# Patient Record
Sex: Female | Born: 1975 | Race: Black or African American | Hispanic: No | Marital: Single | Smoking: Never smoker
Health system: Southern US, Community
[De-identification: ages and names within clinical notes are randomized; demographics above are authoritative.]

## PROBLEM LIST (undated history)

## (undated) DIAGNOSIS — R3129 Other microscopic hematuria: Secondary | ICD-10-CM

## (undated) DIAGNOSIS — F32A Depression, unspecified: Secondary | ICD-10-CM

## (undated) DIAGNOSIS — K219 Gastro-esophageal reflux disease without esophagitis: Secondary | ICD-10-CM

## (undated) DIAGNOSIS — K429 Umbilical hernia without obstruction or gangrene: Secondary | ICD-10-CM

## (undated) DIAGNOSIS — D509 Iron deficiency anemia, unspecified: Secondary | ICD-10-CM

## (undated) DIAGNOSIS — I2699 Other pulmonary embolism without acute cor pulmonale: Secondary | ICD-10-CM

## (undated) DIAGNOSIS — I10 Essential (primary) hypertension: Secondary | ICD-10-CM

## (undated) DIAGNOSIS — N029 Recurrent and persistent hematuria with unspecified morphologic changes: Secondary | ICD-10-CM

## (undated) HISTORY — DX: Depression, unspecified: F32.A

## (undated) HISTORY — DX: Recurrent and persistent hematuria with unspecified morphologic changes: N02.9

## (undated) HISTORY — DX: Iron deficiency anemia, unspecified: D50.9

## (undated) HISTORY — DX: Umbilical hernia without obstruction or gangrene: K42.9

## (undated) HISTORY — DX: Other microscopic hematuria: R31.29

---

## 1998-09-07 ENCOUNTER — Other Ambulatory Visit: Admission: RE | Admit: 1998-09-07 | Discharge: 1998-09-07 | Payer: Self-pay | Admitting: Obstetrics

## 1998-09-09 ENCOUNTER — Other Ambulatory Visit: Admission: RE | Admit: 1998-09-09 | Discharge: 1998-09-09 | Payer: Self-pay | Admitting: Obstetrics

## 1999-08-05 ENCOUNTER — Inpatient Hospital Stay (HOSPITAL_COMMUNITY): Admission: AD | Admit: 1999-08-05 | Discharge: 1999-08-05 | Payer: Self-pay | Admitting: Obstetrics

## 1999-08-05 ENCOUNTER — Encounter: Payer: Self-pay | Admitting: Obstetrics

## 2001-03-25 ENCOUNTER — Encounter: Payer: Self-pay | Admitting: Obstetrics

## 2001-03-25 ENCOUNTER — Inpatient Hospital Stay (HOSPITAL_COMMUNITY): Admission: AD | Admit: 2001-03-25 | Discharge: 2001-03-25 | Payer: Self-pay | Admitting: Obstetrics & Gynecology

## 2012-02-21 ENCOUNTER — Encounter (HOSPITAL_BASED_OUTPATIENT_CLINIC_OR_DEPARTMENT_OTHER): Payer: Self-pay | Admitting: *Deleted

## 2012-02-21 ENCOUNTER — Emergency Department (HOSPITAL_BASED_OUTPATIENT_CLINIC_OR_DEPARTMENT_OTHER)
Admission: EM | Admit: 2012-02-21 | Discharge: 2012-02-21 | Disposition: A | Discharge status: Home / Self Care | Payer: Medicaid Other | Attending: Emergency Medicine | Admitting: Emergency Medicine

## 2012-02-21 DIAGNOSIS — I10 Essential (primary) hypertension: Secondary | ICD-10-CM | POA: Insufficient documentation

## 2012-02-21 DIAGNOSIS — R002 Palpitations: Secondary | ICD-10-CM | POA: Insufficient documentation

## 2012-02-21 HISTORY — DX: Essential (primary) hypertension: I10

## 2012-02-21 LAB — BASIC METABOLIC PANEL
BUN: 15 mg/dL (ref 6–23)
CO2: 26 mEq/L (ref 19–32)
Calcium: 9.1 mg/dL (ref 8.4–10.5)
Chloride: 101 mEq/L (ref 96–112)
Creatinine, Ser: 0.9 mg/dL (ref 0.50–1.10)
GFR calc Af Amer: >90 mL/min (ref >90)
GFR calc non Af Amer: 80 mL/min — ABNORMAL LOW (ref >90)
Glucose, Bld: 89 mg/dL (ref 70–99)
Potassium: 3.2 mEq/L — ABNORMAL LOW (ref 3.5–5.1)
Sodium: 137 mEq/L (ref 135–145)

## 2012-02-21 LAB — CBC WITH DIFFERENTIAL/PLATELET
Basophils Absolute: 0 10*3/uL (ref 0.0–0.1)
Basophils Relative: 0 % (ref 0–1)
Eosinophils Absolute: 0.2 10*3/uL (ref 0.0–0.7)
Eosinophils Relative: 3 % (ref 0–5)
HCT: 32.1 % — ABNORMAL LOW (ref 36.0–46.0)
Hemoglobin: 10.7 g/dL — ABNORMAL LOW (ref 12.0–15.0)
Lymphocytes Relative: 47 % — ABNORMAL HIGH (ref 12–46)
Lymphs Abs: 3.6 10*3/uL (ref 0.7–4.0)
MCH: 30.7 pg (ref 26.0–34.0)
MCHC: 33.3 g/dL (ref 30.0–36.0)
MCV: 92 fL (ref 78.0–100.0)
Monocytes Absolute: 0.7 10*3/uL (ref 0.1–1.0)
Monocytes Relative: 9 % (ref 3–12)
Neutro Abs: 3.1 10*3/uL (ref 1.7–7.7)
Neutrophils Relative %: 41 % — ABNORMAL LOW (ref 43–77)
Platelets: 255 10*3/uL (ref 150–400)
RBC: 3.49 MIL/uL — ABNORMAL LOW (ref 3.87–5.11)
RDW: 12.7 % (ref 11.5–15.5)
WBC: 7.7 10*3/uL (ref 4.0–10.5)

## 2012-02-21 LAB — TROPONIN I: Troponin I: <0.3 ng/mL (ref <0.30)

## 2012-02-21 MED ORDER — METOPROLOL TARTRATE 50 MG PO TABS
25.0000 mg | ORAL_TABLET | Freq: Once | ORAL | Status: AC
  Administered 2012-02-21: 25 mg via ORAL
  Filled 2012-02-21: qty 1

## 2012-02-21 NOTE — ED Notes (Signed)
Pt c/o heart palpitation.SB on monitor @58 

## 2012-02-21 NOTE — ED Notes (Signed)
Pt c/o heart racing while lying in bed tonight. Pt denies CP, no SOB at this time.

## 2012-02-21 NOTE — ED Provider Notes (Signed)
History     CSN: [id number]  Arrival date & time 02/21/12  0122   First MD Initiated Contact with Patient 02/21/12 0252      Chief Complaint  Patient presents with  . Palpitations    (Consider location/radiation/quality/duration/timing/severity/associated sxs/prior treatment) HPI This is a 36 year old black female who awoke this morning with her heart racing. It lasted several minutes. She had several additional episodes of this, each lasting several minutes. She's not had one since being placed on the monitor here in the ED. The symptoms were accompanied by hyperventilation and tingling of the tongue but no chest pain. She has no history of similar symptoms. She is on carvedilol for high blood pressure. She was taking Diovan and hydrochlorothiazide but has not been able to take that for about 2 weeks due to difficulties with Medicaid. Her symptoms were moderate in severity. There were no specific exacerbating or mitigating factors.  Past Medical History  Diagnosis Date  . Hypertension     History reviewed. No pertinent past surgical history.  History reviewed. No pertinent family history.  History  Substance Use Topics  . Smoking status: Never Smoker   . Smokeless tobacco: Not on file  . Alcohol Use: No    OB History    Grav Para Term Preterm Abortions TAB SAB Ect Mult Living                  Review of Systems  All other systems reviewed and are negative.    Allergies  Review of patient's allergies indicates no known allergies.  Home Medications   Current Outpatient Rx  Name Route Sig Dispense Refill  . CARVEDILOL 12.5 MG PO TABS Oral Take 12.5 mg by mouth 2 (two) times daily with a meal.    . FERROUS SULFATE 325 (65 FE) MG PO TABS Oral Take 325 mg by mouth daily with breakfast.    . POTASSIUM CHLORIDE ER 10 MEQ PO TBCR Oral Take 10 mEq by mouth 2 (two) times daily.    Marland Kitchen VALSARTAN-HYDROCHLOROTHIAZIDE 80-12.5 MG PO TABS Oral Take 1 tablet by mouth daily.       BP 146/98  Pulse 66  Temp 98.6 F (37 C) (Oral)  Resp 18  Ht 5\' 2"  (1.575 m)  Wt 173 lb (78.472 kg)  BMI 31.64 kg/m2  SpO2 100%  Physical Exam General: Well-developed, well-nourished female in no acute distress; appearance consistent with age of record HENT: normocephalic, atraumatic Eyes: pupils equal round and reactive to light; extraocular muscles intact Neck: supple Heart: regular rate and rhythm; sinus arrhythmia on monitor Lungs: clear to auscultation bilaterally Abdomen: soft; nondistended; nontender; bowel sounds present Extremities: No deformity; full range of motion; pulses normal; no edema Neurologic: Awake, alert and oriented; motor function intact in all extremities and symmetric; no facial droop Skin: Warm and dry Psychiatric: Normal mood and affect    ED Course  Procedures (including critical care time)     MDM   Nursing notes and vitals signs, including pulse oximetry, reviewed.  Summary of this visit's results, reviewed by myself:  Labs:  Results for orders placed during the hospital encounter of 02/21/12  BASIC METABOLIC PANEL      Component Value Range   Sodium 137  135 - 145 mEq/L   Potassium 3.2 (*) 3.5 - 5.1 mEq/L   Chloride 101  96 - 112 mEq/L   CO2 26  19 - 32 mEq/L   Glucose, Bld 89  70 - 99 mg/dL  BUN 15  6 - 23 mg/dL   Creatinine, Ser 9.60  0.50 - 1.10 mg/dL   Calcium 9.1  8.4 - 45.4 mg/dL   GFR calc non Af Amer 80 (*) >90 mL/min   GFR calc Af Amer >90  >90 mL/min  CBC WITH DIFFERENTIAL      Component Value Range   WBC 7.7  4.0 - 10.5 K/uL   RBC 3.49 (*) 3.87 - 5.11 MIL/uL   Hemoglobin 10.7 (*) 12.0 - 15.0 g/dL   HCT 09.8 (*) 11.9 - 14.7 %   MCV 92.0  78.0 - 100.0 fL   MCH 30.7  26.0 - 34.0 pg   MCHC 33.3  30.0 - 36.0 g/dL   RDW 82.9  56.2 - 13.0 %   Platelets 255  150 - 400 K/uL   Neutrophils Relative 41 (*) 43 - 77 %   Neutro Abs 3.1  1.7 - 7.7 K/uL   Lymphocytes Relative 47 (*) 12 - 46 %   Lymphs Abs 3.6  0.7 - 4.0  K/uL   Monocytes Relative 9  3 - 12 %   Monocytes Absolute 0.7  0.1 - 1.0 K/uL   Eosinophils Relative 3  0 - 5 %   Eosinophils Absolute 0.2  0.0 - 0.7 K/uL   Basophils Relative 0  0 - 1 %   Basophils Absolute 0.0  0.0 - 0.1 K/uL  TROPONIN I      Component Value Range   Troponin I <0.30  <0.30 ng/mL       Date: 02/21/2012 1:41 AM  Rate: 56  Rhythm: Sinus bradycardia  QRS Axis: normal  Intervals: normal  ST/T Wave abnormalities: normal  Conduction Disutrbances: none  Narrative Interpretation: unremarkable  Comparison with previous EKG: None available  4:11 AM TSH ordered but will not result in the ED. The patient was advised to have her primary care physician check the results. She was advised that a Holter monitor may be required should symptoms recur. She is already on a beta blocker.             Hanley Seamen, MD 02/21/12 [phone]

## 2012-03-12 ENCOUNTER — Emergency Department (HOSPITAL_BASED_OUTPATIENT_CLINIC_OR_DEPARTMENT_OTHER)
Admission: EM | Admit: 2012-03-12 | Discharge: 2012-03-12 | Disposition: A | Discharge status: Home / Self Care | Payer: Medicaid Other | Attending: Emergency Medicine | Admitting: Emergency Medicine

## 2012-03-12 ENCOUNTER — Encounter (HOSPITAL_BASED_OUTPATIENT_CLINIC_OR_DEPARTMENT_OTHER): Payer: Self-pay | Admitting: *Deleted

## 2012-03-12 DIAGNOSIS — T07XXXA Unspecified multiple injuries, initial encounter: Secondary | ICD-10-CM | POA: Insufficient documentation
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
## 2012-03-12 NOTE — ED Notes (Signed)
Called x 2 no answer

## 2012-03-12 NOTE — ED Provider Notes (Signed)
Pt left the ED prior to my evaluation  Rolan Bucco, MD 03/12/12 2345

## 2012-07-12 ENCOUNTER — Emergency Department (HOSPITAL_BASED_OUTPATIENT_CLINIC_OR_DEPARTMENT_OTHER): Payer: Medicaid Other

## 2012-07-12 ENCOUNTER — Emergency Department (HOSPITAL_COMMUNITY): Payer: Medicaid Other

## 2012-07-12 ENCOUNTER — Encounter (HOSPITAL_BASED_OUTPATIENT_CLINIC_OR_DEPARTMENT_OTHER): Payer: Self-pay | Admitting: Emergency Medicine

## 2012-07-12 ENCOUNTER — Emergency Department (HOSPITAL_BASED_OUTPATIENT_CLINIC_OR_DEPARTMENT_OTHER)
Admission: EM | Admit: 2012-07-12 | Discharge: 2012-07-12 | Disposition: A | Discharge status: Home / Self Care | Payer: Medicaid Other | Attending: Emergency Medicine | Admitting: Emergency Medicine

## 2012-07-12 DIAGNOSIS — Z79899 Other long term (current) drug therapy: Secondary | ICD-10-CM | POA: Insufficient documentation

## 2012-07-12 DIAGNOSIS — I10 Essential (primary) hypertension: Secondary | ICD-10-CM | POA: Insufficient documentation

## 2012-07-12 DIAGNOSIS — R209 Unspecified disturbances of skin sensation: Secondary | ICD-10-CM | POA: Insufficient documentation

## 2012-07-12 DIAGNOSIS — R51 Headache: Secondary | ICD-10-CM | POA: Insufficient documentation

## 2012-07-12 DIAGNOSIS — R202 Paresthesia of skin: Secondary | ICD-10-CM

## 2012-07-12 LAB — DIFFERENTIAL
Lymphocytes Relative: 44 % (ref 12–46)
Monocytes Absolute: 0.5 10*3/uL (ref 0.1–1.0)
Monocytes Relative: 9 % (ref 3–12)
Neutro Abs: 2.5 10*3/uL (ref 1.7–7.7)

## 2012-07-12 LAB — URINALYSIS, ROUTINE W REFLEX MICROSCOPIC
Bilirubin Urine: NEGATIVE
Nitrite: NEGATIVE
Specific Gravity, Urine: 1.01 (ref 1.005–1.030)
pH: 6.5 (ref 5.0–8.0)

## 2012-07-12 LAB — APTT: aPTT: 36 seconds (ref 24–37)

## 2012-07-12 LAB — GLUCOSE, CAPILLARY: Glucose-Capillary: 84 mg/dL (ref 70–99)

## 2012-07-12 LAB — TROPONIN I: Troponin I: <0.3 ng/mL (ref <0.30)

## 2012-07-12 LAB — COMPREHENSIVE METABOLIC PANEL
BUN: 13 mg/dL (ref 6–23)
CO2: 29 mEq/L (ref 19–32)
Chloride: 100 mEq/L (ref 96–112)
Creatinine, Ser: 0.8 mg/dL (ref 0.50–1.10)
GFR calc non Af Amer: >90 mL/min (ref >90)
Total Bilirubin: 0.5 mg/dL (ref 0.3–1.2)

## 2012-07-12 LAB — URINE MICROSCOPIC-ADD ON

## 2012-07-12 LAB — RAPID URINE DRUG SCREEN, HOSP PERFORMED
Barbiturates: NOT DETECTED
Cocaine: NOT DETECTED
Opiates: NOT DETECTED

## 2012-07-12 LAB — CBC
HCT: 36.2 % (ref 36.0–46.0)
Hemoglobin: 12 g/dL (ref 12.0–15.0)
WBC: 5.6 10*3/uL (ref 4.0–10.5)

## 2012-07-12 MED ORDER — DIPHENHYDRAMINE HCL 50 MG/ML IJ SOLN
25.0000 mg | Freq: Once | INTRAMUSCULAR | Status: AC
  Administered 2012-07-12: 25 mg via INTRAVENOUS
  Filled 2012-07-12: qty 1

## 2012-07-12 MED ORDER — LORAZEPAM 2 MG/ML IJ SOLN
1.0000 mg | Freq: Once | INTRAMUSCULAR | Status: AC
  Administered 2012-07-12: 1 mg via INTRAVENOUS
  Filled 2012-07-12: qty 1

## 2012-07-12 MED ORDER — SODIUM CHLORIDE 0.9 % IV SOLN
Freq: Once | INTRAVENOUS | Status: AC
  Administered 2012-07-12: 15:00:00 via INTRAVENOUS

## 2012-07-12 MED ORDER — METOCLOPRAMIDE HCL 5 MG/ML IJ SOLN
10.0000 mg | Freq: Once | INTRAMUSCULAR | Status: AC
  Administered 2012-07-12: 10 mg via INTRAVENOUS
  Filled 2012-07-12: qty 2

## 2012-07-12 MED ORDER — HYDROMORPHONE HCL PF 1 MG/ML IJ SOLN
0.5000 mg | Freq: Once | INTRAMUSCULAR | Status: AC
  Administered 2012-07-12: 0.5 mg via INTRAVENOUS
  Filled 2012-07-12: qty 1

## 2012-07-12 NOTE — ED Notes (Signed)
MRI called, pt having anxiety attack in MRI. Dr. Rubin Payor made aware.

## 2012-07-12 NOTE — ED Notes (Signed)
Pt returned from MRI °

## 2012-07-12 NOTE — ED Notes (Signed)
Per [facility] - pt c/o intermittent tingling in extremities, sometimes right sided sometimes left sided along with numbness in face X 5 days. Pt is ambulatory. Pt has 20G in right AC.

## 2012-07-12 NOTE — ED Provider Notes (Signed)
History     CSN: [id number]  Arrival date & time 07/12/12  1218   First MD Initiated Contact with Patient 07/12/12 1408      Chief Complaint  Patient presents with  . Headache  . Numbness    (Consider location/radiation/quality/duration/timing/severity/associated sxs/prior treatment) Patient is a 37 y.o. female presenting with headaches.  Headache Associated symptoms: numbness   Associated symptoms: no abdominal pain, no back pain and no fever    patient presents as a transfer from meds in [facility] for an MRI. She's been having headaches and paresthesias   in various areas. The headaches come and go as do the paresthesias. They're not necessarily associated with each other. She had negative CT at [facility]. Past Medical History  Diagnosis Date  . Hypertension     No past surgical history on file.  No family history on file.  History  Substance Use Topics  . Smoking status: Never Smoker   . Smokeless tobacco: Not on file  . Alcohol Use: No    OB History   Grav Para Term Preterm Abortions TAB SAB Ect Mult Living                  Review of Systems  Constitutional: Negative for fever and chills.  Respiratory: Negative for chest tightness.   Cardiovascular: Positive for chest pain.  Gastrointestinal: Negative for abdominal pain.  Genitourinary: Negative for frequency.  Musculoskeletal: Negative for back pain and arthralgias.  Neurological: Positive for numbness and headaches. Negative for weakness.  Psychiatric/Behavioral: Negative for confusion.    Allergies  Review of patient's allergies indicates no known allergies.  Home Medications   Current Outpatient Rx  Name  Route  Sig  Dispense  Refill  . Aspirin-Acetaminophen-Caffeine (GOODY HEADACHE PO)   Oral   Take 1 packet by mouth daily as needed.         . carvedilol (COREG) 12.5 MG tablet   Oral   Take 12.5 mg by mouth daily.          . ferrous sulfate 325 (65 FE) MG tablet   Oral   Take 325 mg by mouth daily with breakfast.         . potassium chloride (K-DUR) 10 MEQ tablet   Oral   Take 10 mEq by mouth 2 (two) times daily.         . valsartan-hydrochlorothiazide (DIOVAN-HCT) 80-12.5 MG per tablet   Oral   Take 1 tablet by mouth daily.           BP 111/78  Pulse 56  Temp(Src) 98.2 F (36.8 C) (Oral)  Resp 18  Ht 5\' 2"  (1.575 m)  Wt 172 lb (78.019 kg)  BMI 31.45 kg/m2  SpO2 98%  LMP 07/02/2012  Physical Exam  Constitutional: She is oriented to person, place, and time. She appears well-developed and well-nourished.  HENT:  Head: Normocephalic.  Eyes: Pupils are equal, round, and reactive to light.  Cardiovascular: Normal rate and regular rhythm.   Pulmonary/Chest: Effort normal.  Abdominal: Soft.  Musculoskeletal: Normal range of motion.  Neurological: She is alert and oriented to person, place, and time. No cranial nerve deficit.    ED Course  Procedures (including critical care time)  Labs Reviewed  COMPREHENSIVE METABOLIC PANEL - Abnormal; Notable for the following:    Potassium 3.3 (*)    All other components within normal limits  URINALYSIS, ROUTINE W REFLEX MICROSCOPIC - Abnormal; Notable for the following:  Hgb urine dipstick LARGE (*)    Leukocytes, UA TRACE (*)    All other components within normal limits  URINE MICROSCOPIC-ADD ON - Abnormal; Notable for the following:    Squamous Epithelial / LPF FEW (*)    All other components within normal limits  ETHANOL  PROTIME-INR  APTT  CBC  DIFFERENTIAL  URINE RAPID DRUG SCREEN (HOSP PERFORMED)  TROPONIN I  PREGNANCY, URINE  GLUCOSE, CAPILLARY   Ct Head Wo Contrast  07/12/2012  *RADIOLOGY REPORT*  Clinical Data: Headache, numbness  CT HEAD WITHOUT CONTRAST  Technique:  Contiguous axial images were obtained from the base of the skull through the vertex without contrast.  Comparison: None.  Findings: No skull fracture is noted.  Paranasal sinuses and mastoid air cells are  unremarkable.  No intracranial hemorrhage, mass effect or midline shift.  No acute infarction.  The gray and white matter differentiation is preserved.  No mass lesion is noted on this unenhanced scan.  No intra or extra-axial fluid collection.  No hydrocephalus.  IMPRESSION: No acute intracranial abnormality.   Original Report Authenticated By: Natasha Mead, M.D.    Mr Brain Wo Contrast  07/12/2012  *RADIOLOGY REPORT*  Clinical Data: Headache.  Numbness.  MRI HEAD WITHOUT CONTRAST  Technique:  Multiplanar, multiecho pulse sequences of the brain and surrounding structures were obtained according to standard protocol without intravenous contrast.  Comparison: Head CT same day  Findings: The brain has a normal appearance on all pulse sequences without evidence of malformation, atrophy, old or acute infarction, mass lesion, hemorrhage, hydrocephalus or extra-axial collection. No pituitary mass.  No fluid in the sinuses, middle ears or mastoids.  IMPRESSION: Normal MRI of the brain   Original Report Authenticated By: Paulina Fusi, M.D.      1. Headache   2. Paresthesias in right hand   3. Right leg paresthesias       MDM  Patient sent from [facility] for MRI. Negative MRI of brain. Patient is without deficit this time. She will followup with neurology. Complicated migraine is on the differential.        Juliet Rude. Rubin Payor, MD 07/12/12 [phone]

## 2012-07-12 NOTE — ED Notes (Signed)
Transferred to [facility] by [facility]- transfer accepted by Dr Rubin Payor and Leretha Dykes, charge nurse

## 2012-07-12 NOTE — ED Provider Notes (Signed)
History     CSN: [id number]  Arrival date & time 07/12/12  1218   First MD Initiated Contact with Patient 07/12/12 1408      Chief Complaint  Patient presents with  . Headache  . Numbness    (Consider location/radiation/quality/duration/timing/severity/associated sxs/prior treatment) HPI This 37 year old female has a 5 day history of a gradual onset global headache that started out minimal to mild and then gradually progress to a moderately severe headache over the course of the last 5 days. At approximately the same time the headache started she also had about 20 minutes of some slight tingling to her left hand and left foot the resolved and did not recur. However yesterday evening she was last known well in terms of any neurologic symptoms other than her headache at 6 PM at approximately 6:30 PM noticed the onset of slight numbness and tingling to her right hand and her right foot. Her right hand and foot numbness and tingling isn't very mild but persistent since yesterday. She is not a code stroke candidate since her symptoms started over 8 hours ago. This is also not a straightforward stroke syndrome. She has no numbness to her forearm or upper arm her lower leg or thigh. This morning she woke up however with some slight numbness and tingling to the right side of her mouth and cheek that is now resolved. She is no weakness and no incoordination. She is no change in speech vision swallowing or understanding. There is no treatment prior to arrival. She has not had a history of complicated migraines. There is no sudden onset headache. There is no fever no rash no trauma. She is no neck pain or back pain. She does also have 5 days of very mild but continuous vague chest discomfort and slight shortness of breath without cough or fever. The chest discomfort is vague, nonexertional, nonpleuritic in without associated symptoms other than slight shortness of breath. She is PERC negative. Past Medical  History  Diagnosis Date  . Hypertension     No past surgical history on file.  No family history on file.  History  Substance Use Topics  . Smoking status: Never Smoker   . Smokeless tobacco: Not on file  . Alcohol Use: No    OB History   Grav Para Term Preterm Abortions TAB SAB Ect Mult Living                  Review of Systems 10 Systems reviewed and are negative for acute change except as noted in the HPI. Allergies  Review of patient's allergies indicates no known allergies.  Home Medications   Current Outpatient Rx  Name  Route  Sig  Dispense  Refill  . carvedilol (COREG) 12.5 MG tablet   Oral   Take 12.5 mg by mouth 2 (two) times daily with a meal.         . ferrous sulfate 325 (65 FE) MG tablet   Oral   Take 325 mg by mouth daily with breakfast.         . potassium chloride (K-DUR) 10 MEQ tablet   Oral   Take 10 mEq by mouth 2 (two) times daily.         . valsartan-hydrochlorothiazide (DIOVAN-HCT) 80-12.5 MG per tablet   Oral   Take 1 tablet by mouth daily.           BP 154/102  Pulse 62  Temp(Src) 98.4 F (36.9 C) (Oral)  Resp 17  Ht 5\' 2"  (1.575 m)  Wt 172 lb (78.019 kg)  BMI 31.45 kg/m2  SpO2 100%  LMP 07/02/2012  Physical Exam  Nursing note and vitals reviewed. Constitutional:  Awake, alert, nontoxic appearance with baseline speech for patient.  HENT:  Head: Atraumatic.  Mouth/Throat: No oropharyngeal exudate.  Eyes: EOM are normal. Pupils are equal, round, and reactive to light. Right eye exhibits no discharge. Left eye exhibits no discharge.  Neck: Neck supple.  Cardiovascular: Normal rate and regular rhythm.   No murmur heard. Pulmonary/Chest: Effort normal and breath sounds normal. No stridor. No respiratory distress. She has no wheezes. She has no rales. She exhibits no tenderness.  Abdominal: Soft. Bowel sounds are normal. She exhibits no mass. There is no tenderness. There is no rebound.  Musculoskeletal: She exhibits  no edema and no tenderness.  Baseline ROM, moves extremities with no obvious new focal weakness.  Lymphadenopathy:    She has no cervical adenopathy.  Neurological: She is alert.  Awake, alert, cooperative and aware of situation; motor strength 5/5 bilaterally; sensation normal to light touch bilaterally except slight decreased light touch to right hand and right foot only; peripheral visual fields full to confrontation; no facial asymmetry; tongue midline; major cranial nerves appear intact; no pronator drift, normal finger to nose bilaterally, baseline gait without new ataxia.  Skin: No rash noted.  Psychiatric: She has a normal mood and affect.    ED Course  Procedures (including critical care time) ECG: Sinus bradycardia, ventricular rate 57, normal axis, normal intervals, no acute ischemic changes noted, no comparison ECG immediately available  Patient / Family / Caregiver understand and agree with initial ED impression and plan with expectations set for ED visit.  Pt feels improved after observation and/or treatment in ED. patient's headache is essentially resolved, she is resting comfortably in the ED, she however still has some persistent slight numbness tingling only to the right hand and right foot with the rest of the right arm and right leg still feeling normal. I discussed the case with the patient and her father and we will the patient to the [facility] Emergency department so she can have an MRI scan to rule out stroke.   Clinically I doubt subarachnoid hemorrhage or bacterial meningitis, and have low suspicion but cannot rule out stroke and was MRI not available at the freestanding emergency department feel transfer for MRI is reasonable and necessary as does the patient. Labs Reviewed  COMPREHENSIVE METABOLIC PANEL - Abnormal; Notable for the following:    Potassium 3.3 (*)    All other components within normal limits  URINALYSIS, ROUTINE W REFLEX MICROSCOPIC - Abnormal; Notable  for the following:    Hgb urine dipstick LARGE (*)    Leukocytes, UA TRACE (*)    All other components within normal limits  URINE MICROSCOPIC-ADD ON - Abnormal; Notable for the following:    Squamous Epithelial / LPF FEW (*)    All other components within normal limits  ETHANOL  PROTIME-INR  APTT  CBC  DIFFERENTIAL  URINE RAPID DRUG SCREEN (HOSP PERFORMED)  TROPONIN I  PREGNANCY, URINE  GLUCOSE, CAPILLARY   Ct Head Wo Contrast  07/12/2012  *RADIOLOGY REPORT*  Clinical Data: Headache, numbness  CT HEAD WITHOUT CONTRAST  Technique:  Contiguous axial images were obtained from the base of the skull through the vertex without contrast.  Comparison: None.  Findings: No skull fracture is noted.  Paranasal sinuses and mastoid air cells are unremarkable.  No intracranial hemorrhage,  mass effect or midline shift.  No acute infarction.  The gray and white matter differentiation is preserved.  No mass lesion is noted on this unenhanced scan.  No intra or extra-axial fluid collection.  No hydrocephalus.  IMPRESSION: No acute intracranial abnormality.   Original Report Authenticated By: Natasha Mead, M.D.      1. Headache   2. Paresthesias in right hand   3. Right leg paresthesias       MDM  The patient appears reasonably stabilized for transfer considering the current resources, flow, and capabilities available in the ED at this time, and I doubt any other [facility] requiring further screening and/or treatment in the ED prior to transfer.        Hurman Horn, MD 07/12/12 [phone]

## 2012-07-12 NOTE — ED Notes (Signed)
Pt states she has had headache for 4 days, numbness in hands and feet and chest tightness x 2 days.  Pt having sob x 4 days.

## 2012-11-09 ENCOUNTER — Encounter (HOSPITAL_BASED_OUTPATIENT_CLINIC_OR_DEPARTMENT_OTHER): Payer: Self-pay

## 2012-11-09 ENCOUNTER — Emergency Department (HOSPITAL_BASED_OUTPATIENT_CLINIC_OR_DEPARTMENT_OTHER)
Admission: EM | Admit: 2012-11-09 | Discharge: 2012-11-09 | Disposition: A | Discharge status: Home / Self Care | Payer: Medicaid Other | Attending: Emergency Medicine | Admitting: Emergency Medicine

## 2012-11-09 ENCOUNTER — Emergency Department (HOSPITAL_BASED_OUTPATIENT_CLINIC_OR_DEPARTMENT_OTHER): Payer: Medicaid Other

## 2012-11-09 DIAGNOSIS — I10 Essential (primary) hypertension: Secondary | ICD-10-CM | POA: Insufficient documentation

## 2012-11-09 DIAGNOSIS — O039 Complete or unspecified spontaneous abortion without complication: Secondary | ICD-10-CM | POA: Insufficient documentation

## 2012-11-09 DIAGNOSIS — Z79899 Other long term (current) drug therapy: Secondary | ICD-10-CM | POA: Insufficient documentation

## 2012-11-09 LAB — CBC
HCT: 31.6 % — ABNORMAL LOW (ref 36.0–46.0)
MCV: 91.6 fL (ref 78.0–100.0)
Platelets: 281 10*3/uL (ref 150–400)
RBC: 3.45 MIL/uL — ABNORMAL LOW (ref 3.87–5.11)
RDW: 12.8 % (ref 11.5–15.5)
WBC: 8 10*3/uL (ref 4.0–10.5)

## 2012-11-09 LAB — HEMOGLOBIN AND HEMATOCRIT, BLOOD
HCT: 31.5 % — ABNORMAL LOW (ref 36.0–46.0)
Hemoglobin: 10.7 g/dL — ABNORMAL LOW (ref 12.0–15.0)

## 2012-11-09 MED ORDER — SODIUM CHLORIDE 0.9 % IV BOLUS (SEPSIS)
1000.0000 mL | Freq: Once | INTRAVENOUS | Status: AC
  Administered 2012-11-09: 1000 mL via INTRAVENOUS

## 2012-11-09 MED ORDER — IBUPROFEN 400 MG PO TABS
ORAL_TABLET | ORAL | Status: AC
  Filled 2012-11-09: qty 1

## 2012-11-09 MED ORDER — IBUPROFEN 200 MG PO TABS
ORAL_TABLET | ORAL | Status: AC
  Filled 2012-11-09: qty 1

## 2012-11-09 MED ORDER — OXYCODONE-ACETAMINOPHEN 5-325 MG PO TABS
2.0000 | ORAL_TABLET | Freq: Once | ORAL | Status: AC
  Administered 2012-11-09: 2 via ORAL
  Filled 2012-11-09 (×2): qty 2

## 2012-11-09 MED ORDER — OXYCODONE-ACETAMINOPHEN 5-325 MG PO TABS: 1.0000 | ORAL_TABLET | Freq: Four times a day (QID) | ORAL | Status: DC | PRN

## 2012-11-09 MED ORDER — IBUPROFEN 400 MG PO TABS
600.0000 mg | ORAL_TABLET | Freq: Once | ORAL | Status: AC
  Administered 2012-11-09: 600 mg via ORAL

## 2012-11-09 NOTE — ED Notes (Signed)
MD at bedside. 

## 2012-11-09 NOTE — ED Notes (Signed)
Pt reports she opted to have an elective abortion 10/30/2012 and she wouldn't dilate.  She was at clinic x 2 and given "4 pills" on 11/01/2012 and developed abdominal pain and vaginal bleeding last night.

## 2012-11-09 NOTE — ED Notes (Signed)
Pt ambulated about 200 ft without dizziness, nausea or vomiting, reports feeling much stronger pt ambulated independently

## 2012-11-09 NOTE — ED Notes (Signed)
Pt talking and laughing with friend at bedside. States "I feel so much better now!" denies any c/o at this time.

## 2012-11-09 NOTE — ED Provider Notes (Addendum)
History     CSN: [id number]  Arrival date & time 11/09/12  1605   First MD Initiated Contact with Patient 11/09/12 1609      Chief Complaint  Patient presents with  . Vaginal Bleeding  . Elective Abortion    (Consider location/radiation/quality/duration/timing/severity/associated sxs/prior treatment) Patient is a 37 y.o. female presenting with vaginal bleeding.  Vaginal Bleeding  Pt reports she is approx [redacted]wks pregnant and had gone to clinic for elective abortion about 10 days ago where they were unable to perform D&C and so gave her intravaginal pills (?misoprostol) but still unable to perform procedure 2 days later. She was advised to wait 10 days and follow up with her primary Ob/Gyn however in the meantime, last night she began to have intermittent severe abdominal cramping and today passing large amount of vaginal blood and clots. She denies any lightheadedness. No vomiting.   Past Medical History  Diagnosis Date  . Hypertension     History reviewed. No pertinent past surgical history.  No family history on file.  History  Substance Use Topics  . Smoking status: Never Smoker   . Smokeless tobacco: Not on file  . Alcohol Use: No    OB History   Grav Para Term Preterm Abortions TAB SAB Ect Mult Living                  Review of Systems  Genitourinary: Positive for vaginal bleeding.   All other systems reviewed and are negative except as noted in HPI.   Allergies  Review of patient's allergies indicates no known allergies.  Home Medications   Current Outpatient Rx  Name  Route  Sig  Dispense  Refill  . Aspirin-Acetaminophen-Caffeine (GOODY HEADACHE PO)   Oral   Take 1 packet by mouth daily as needed.         . carvedilol (COREG) 12.5 MG tablet   Oral   Take 12.5 mg by mouth daily.          . ferrous sulfate 325 (65 FE) MG tablet   Oral   Take 325 mg by mouth daily with breakfast.         . potassium chloride (K-DUR) 10 MEQ tablet   Oral    Take 10 mEq by mouth 2 (two) times daily.         . valsartan-hydrochlorothiazide (DIOVAN-HCT) 80-12.5 MG per tablet   Oral   Take 1 tablet by mouth daily.           BP 138/66  Pulse 60  Temp(Src) 99.3 F (37.4 C) (Oral)  Resp 16  Ht 5\' 2"  (1.575 m)  Wt 172 lb (78.019 kg)  BMI 31.45 kg/m2  SpO2 100%  LMP 09/18/2012  Physical Exam  Nursing note and vitals reviewed. Constitutional: She is oriented to person, place, and time. She appears well-developed and well-nourished.  HENT:  Head: Normocephalic and atraumatic.  Eyes: EOM are normal. Pupils are equal, round, and reactive to light.  Neck: Normal range of motion. Neck supple.  Cardiovascular: Normal rate, normal heart sounds and intact distal pulses.   Pulmonary/Chest: Effort normal and breath sounds normal.  Abdominal: Bowel sounds are normal. She exhibits no distension. There is tenderness (suprapubic). There is no rebound and no guarding.  Genitourinary:  Large amount of blood and clot in vagina, unable to fully visualize the cervix  Musculoskeletal: Normal range of motion. She exhibits no edema and no tenderness.  Neurological: She is alert and oriented to person, place,  and time. She has normal strength. No cranial nerve deficit or sensory deficit.  Skin: Skin is warm and dry. No rash noted.  Psychiatric: She has a normal mood and affect.    ED Course  Procedures (including critical care time)  Labs Reviewed  CBC - Abnormal; Notable for the following:    RBC 3.45 (*)    Hemoglobin 10.6 (*)    HCT 31.6 (*)    All other components within normal limits  PREGNANCY, URINE - Abnormal; Notable for the following:    Preg Test, Ur POSITIVE (*)    All other components within normal limits   US Ob Comp Less 14 Wks  11/09/2012   *RADIOLOGY REPORT*  Clinical Data: Vaginal bleeding.  Abdominal pain.  Recent therapeutic abortion.  OBSTETRIC <14 WK [place] AND TRANSVAGINAL OB US  Technique:  Both transabdominal and transvaginal  ultrasound examinations were performed for complete evaluation of the gestation as well as the maternal uterus, adnexal regions, and pelvic cul-de-sac.  Transvaginal technique was performed to assess early pregnancy.  Comparison:  None.  Findings:  No intrauterine gestational sac identified.  Endometrial thickness measures 23 mm, and a small amount of fluid is noted in the inferior aspect of the endometrial cavity, however no discrete mass is visualized.  No evidence of fibroids.  Right ovary measures 3.0 x 1.8 x 2.2 cm and is normal appearance. Left ovary not directly visualized, however no adnexal mass or free fluid identified.  IMPRESSION:  1. Mildly thickened endometrium with small amount of fluid or blood noted in the inferior portion of endometrial cavity.  No definite evidence of retained products of conception. 2.  No adnexal mass or free fluid identified.   Original Report Authenticated By: Myles Rosenthal, M.D.   US Ob Transvaginal  11/09/2012   *RADIOLOGY REPORT*  Clinical Data: Vaginal bleeding.  Abdominal pain.  Recent therapeutic abortion.  OBSTETRIC <14 WK [place] AND TRANSVAGINAL OB US  Technique:  Both transabdominal and transvaginal ultrasound examinations were performed for complete evaluation of the gestation as well as the maternal uterus, adnexal regions, and pelvic cul-de-sac.  Transvaginal technique was performed to assess early pregnancy.  Comparison:  None.  Findings:  No intrauterine gestational sac identified.  Endometrial thickness measures 23 mm, and a small amount of fluid is noted in the inferior aspect of the endometrial cavity, however no discrete mass is visualized.  No evidence of fibroids.  Right ovary measures 3.0 x 1.8 x 2.2 cm and is normal appearance. Left ovary not directly visualized, however no adnexal mass or free fluid identified.  IMPRESSION:  1. Mildly thickened endometrium with small amount of fluid or blood noted in the inferior portion of endometrial cavity.  No definite  evidence of retained products of conception. 2.  No adnexal mass or free fluid identified.   Original Report Authenticated By: Myles Rosenthal, M.D.     1. Spontaneous abortion       MDM  Plan CBC, urine hcg and [place] to eval for retained products. Pain medications.   6:26 PM Pt has a brief episode of lightheadedness on returning from [place]. No hypotension, feeling better after IVF and rest. Hgb has mild drop from previous 4 months ago, but not critically low or in need of transfusion. She still has pos hcg so suspect this is spontaneous abortion. Advised to follow up with Ob/Gyn for recheck in 4-5 days, sooner or return here if bleeding worsens or if she feels lightheaded or dizzy.  Candace Ramus B. Bernette Mayers, MD 11/09/12 1829  8:04 PM Prior to discharge patient had another episode of lightheadedness. She was put back in her room, given an additional bag of IVF and H/H rechecked without change. She was given PO trial and able to walk without difficulty afterwards.   Julane Crock B. Bernette Mayers, MD 11/09/12 2006

## 2012-11-09 NOTE — ED Notes (Signed)
Pt returns from ultrasound. Tech notifies this rn that pt "felt very dizzy" after her exam. This rn enters room to find pt lying supine with eyes open, alert, states "I just feel very woozy and dizzy all of the sudden..." pt states she is having "pains to the bottom of my stomach..."

## 2012-12-29 ENCOUNTER — Encounter (HOSPITAL_BASED_OUTPATIENT_CLINIC_OR_DEPARTMENT_OTHER): Payer: Self-pay

## 2012-12-29 ENCOUNTER — Emergency Department (HOSPITAL_BASED_OUTPATIENT_CLINIC_OR_DEPARTMENT_OTHER)
Admission: EM | Admit: 2012-12-29 | Discharge: 2012-12-30 | Disposition: A | Discharge status: Home / Self Care | Payer: Medicaid Other | Attending: Emergency Medicine | Admitting: Emergency Medicine

## 2012-12-29 ENCOUNTER — Emergency Department (HOSPITAL_BASED_OUTPATIENT_CLINIC_OR_DEPARTMENT_OTHER): Payer: Medicaid Other

## 2012-12-29 DIAGNOSIS — I10 Essential (primary) hypertension: Secondary | ICD-10-CM | POA: Insufficient documentation

## 2012-12-29 DIAGNOSIS — Z79899 Other long term (current) drug therapy: Secondary | ICD-10-CM | POA: Insufficient documentation

## 2012-12-29 DIAGNOSIS — N390 Urinary tract infection, site not specified: Secondary | ICD-10-CM

## 2012-12-29 DIAGNOSIS — Z3202 Encounter for pregnancy test, result negative: Secondary | ICD-10-CM | POA: Insufficient documentation

## 2012-12-29 DIAGNOSIS — Z7982 Long term (current) use of aspirin: Secondary | ICD-10-CM | POA: Insufficient documentation

## 2012-12-29 DIAGNOSIS — R51 Headache: Secondary | ICD-10-CM | POA: Insufficient documentation

## 2012-12-29 LAB — CBC WITH DIFFERENTIAL/PLATELET
Basophils Absolute: 0 10*3/uL (ref 0.0–0.1)
Basophils Relative: 0 % (ref 0–1)
HCT: 33.5 % — ABNORMAL LOW (ref 36.0–46.0)
Lymphocytes Relative: 44 % (ref 12–46)
MCHC: 32.5 g/dL (ref 30.0–36.0)
Monocytes Absolute: 0.6 10*3/uL (ref 0.1–1.0)
Neutro Abs: 2.7 10*3/uL (ref 1.7–7.7)
Neutrophils Relative %: 44 % (ref 43–77)
RDW: 13.1 % (ref 11.5–15.5)
WBC: 6.3 10*3/uL (ref 4.0–10.5)

## 2012-12-29 LAB — URINALYSIS, ROUTINE W REFLEX MICROSCOPIC
Bilirubin Urine: NEGATIVE
Glucose, UA: NEGATIVE mg/dL
Ketones, ur: NEGATIVE mg/dL
Protein, ur: NEGATIVE mg/dL
pH: 7 (ref 5.0–8.0)

## 2012-12-29 LAB — URINE MICROSCOPIC-ADD ON

## 2012-12-29 NOTE — ED Notes (Signed)
C/o dizziness,HA, weakness since 6pm-blurred vision, numbness/tingling to right hand x after standing-PERRLA

## 2012-12-29 NOTE — ED Notes (Addendum)
Pt states she is having chest tightness, sweats x 30-42min-pt reports she did drive self to ED

## 2012-12-29 NOTE — ED Notes (Signed)
Patient transported to CT 

## 2012-12-30 LAB — COMPREHENSIVE METABOLIC PANEL
ALT: 17 U/L (ref 0–35)
AST: 22 U/L (ref 0–37)
Albumin: 3 g/dL — ABNORMAL LOW (ref 3.5–5.2)
Alkaline Phosphatase: 41 U/L (ref 39–117)
CO2: 29 mEq/L (ref 19–32)
Chloride: 102 mEq/L (ref 96–112)
Creatinine, Ser: 0.8 mg/dL (ref 0.50–1.10)
GFR calc non Af Amer: >90 mL/min (ref >90)
Potassium: 3.1 mEq/L — ABNORMAL LOW (ref 3.5–5.1)
Sodium: 140 mEq/L (ref 135–145)
Total Bilirubin: 0.2 mg/dL — ABNORMAL LOW (ref 0.3–1.2)

## 2012-12-30 MED ORDER — NITROFURANTOIN MONOHYD MACRO 100 MG PO CAPS: 100.0000 mg | ORAL_CAPSULE | Freq: Two times a day (BID) | ORAL | Status: DC

## 2012-12-30 MED ORDER — ACETAMINOPHEN 500 MG PO TABS
1000.0000 mg | ORAL_TABLET | Freq: Once | ORAL | Status: AC
  Administered 2012-12-30: 1000 mg via ORAL
  Filled 2012-12-30: qty 2

## 2012-12-30 MED ORDER — POTASSIUM CHLORIDE CRYS ER 20 MEQ PO TBCR: 40.0000 meq | EXTENDED_RELEASE_TABLET | Freq: Once | ORAL | Status: DC

## 2012-12-30 MED ORDER — IBUPROFEN 400 MG PO TABS: 400.0000 mg | ORAL_TABLET | Freq: Four times a day (QID) | ORAL | Status: DC | PRN

## 2012-12-30 MED ORDER — VALSARTAN-HYDROCHLOROTHIAZIDE 80-12.5 MG PO TABS: 1.0000 | ORAL_TABLET | Freq: Every day | ORAL | Status: DC

## 2012-12-30 NOTE — ED Provider Notes (Addendum)
CSN: [id number]     Arrival date & time 12/29/12  2235 History     First MD Initiated Contact with Patient 12/29/12 2356     Chief Complaint  Patient presents with  . Dizziness   (Consider location/radiation/quality/duration/timing/severity/associated sxs/prior Treatment) Patient is a 37 y.o. female presenting with headaches. The history is provided by the patient.  Headache Pain location:  Frontal Quality:  Dull Radiates to:  Does not radiate Onset quality:  Gradual Duration:  5 hours Timing:  Constant Progression:  Unchanged Chronicity:  Recurrent Context: not defecating and not loud noise   Relieved by:  Nothing Worsened by:  Nothing tried Ineffective treatments:  None tried Associated symptoms: no abdominal pain, no back pain, no congestion, no cough, no drainage, no ear pain, no pain, no facial pain, no fever, no myalgias, no neck pain, no neck stiffness, no photophobia, no seizures, no sore throat, no swollen glands, no syncope, no vomiting and no weakness   Risk factors: no anger   States this it typical of when her pressure is up.  She is currently out of her meds and her doctor has not called a new rx in to the pharmacy  Past Medical History  Diagnosis Date  . Hypertension    History reviewed. No pertinent past surgical history. No family history on file. History  Substance Use Topics  . Smoking status: Never Smoker   . Smokeless tobacco: Not on file  . Alcohol Use: No   OB History   Grav Para Term Preterm Abortions TAB SAB Ect Mult Living                 Review of Systems  Constitutional: Negative for fever.  HENT: Negative for ear pain, congestion, sore throat, neck pain, neck stiffness and postnasal drip.   Eyes: Negative for photophobia, pain and redness.  Respiratory: Negative for cough, chest tightness and shortness of breath.   Cardiovascular: Negative for chest pain, palpitations, leg swelling and syncope.  Gastrointestinal: Negative for vomiting  and abdominal pain.  Musculoskeletal: Negative for myalgias and back pain.  Neurological: Positive for light-headedness and headaches. Negative for tremors, seizures, syncope, facial asymmetry, speech difficulty and weakness.  All other systems reviewed and are negative.    Allergies  Review of patient's allergies indicates no known allergies.  Home Medications   Current Outpatient Rx  Name  Route  Sig  Dispense  Refill  . Aspirin-Acetaminophen-Caffeine (GOODY HEADACHE PO)   Oral   Take 1 packet by mouth daily as needed.         . carvedilol (COREG) 12.5 MG tablet   Oral   Take 12.5 mg by mouth daily.          . ferrous sulfate 325 (65 FE) MG tablet   Oral   Take 325 mg by mouth daily with breakfast.         . oxyCODONE-acetaminophen (PERCOCET/ROXICET) 5-325 MG per tablet   Oral   Take 1-2 tablets by mouth every 6 (six) hours as needed for pain.   20 tablet   0   . potassium chloride (K-DUR) 10 MEQ tablet   Oral   Take 10 mEq by mouth 2 (two) times daily.         . valsartan-hydrochlorothiazide (DIOVAN-HCT) 80-12.5 MG per tablet   Oral   Take 1 tablet by mouth daily.          BP 168/112  Pulse 62  Temp(Src) 99.4 F (37.4 C) (Oral)  Resp 18  Ht 5\' 2"  (1.575 m)  Wt 172 lb (78.019 kg)  BMI 31.45 kg/m2  LMP 12/16/2012 Physical Exam  Constitutional: She is oriented to person, place, and time. She appears well-developed and well-nourished. No distress.  HENT:  Head: Normocephalic and atraumatic.  Mouth/Throat: Oropharynx is clear and moist. No oropharyngeal exudate.  Eyes: Conjunctivae and EOM are normal. Pupils are equal, round, and reactive to light.  Neck: Normal range of motion. Neck supple.  Cardiovascular: Normal rate, regular rhythm and intact distal pulses.   Pulmonary/Chest: Effort normal and breath sounds normal. No stridor. She has no wheezes. She has no rales.  Abdominal: Soft. Bowel sounds are normal. There is no tenderness. There is no  rebound and no guarding.  Musculoskeletal: Normal range of motion.  Lymphadenopathy:    She has no cervical adenopathy.  Neurological: She is alert and oriented to person, place, and time.  Skin: Skin is warm and dry. She is not diaphoretic.  Psychiatric: Her mood appears anxious.    ED Course   Procedures (including critical care time)  Labs Reviewed  CBC WITH DIFFERENTIAL - Abnormal; Notable for the following:    RBC 3.57 (*)    Hemoglobin 10.9 (*)    HCT 33.5 (*)    All other components within normal limits  URINALYSIS, ROUTINE W REFLEX MICROSCOPIC - Abnormal; Notable for the following:    Hgb urine dipstick LARGE (*)    Leukocytes, UA TRACE (*)    All other components within normal limits  TROPONIN I  PREGNANCY, URINE  URINE MICROSCOPIC-ADD ON  COMPREHENSIVE METABOLIC PANEL   Ct Head Wo Contrast  12/30/2012   *RADIOLOGY REPORT*  Clinical Data: Dizziness, headache, hypertension, right hand tingling.  CT HEAD WITHOUT CONTRAST  Technique:  Contiguous axial images were obtained from the base of the skull through the vertex without contrast.  Comparison: 07/12/2012.  Findings:  Calvarium:No acute osseous abnormality. No lytic or blastic lesion.  Orbits: No acute abnormality.  Brain: No evidence of acute abnormality, such as acute infarction, hemorrhage, hydrocephalus, or mass lesion/mass effect.  IMPRESSION:   Negative head CT.   Original Report Authenticated By: Tiburcio Pea   No diagnosis found.  MDM  CT within 6 hours of onset of the symptoms.  No indication for further imaging nor LP at this time.  Pain improved with lower BP.  Will refill BP meds until patient can see PMD.  All symptoms improved at this time.    Doubt cardiac etiology.  No CP no DOE no SOB.      Date: 12/30/2012  Rate: 57  Rhythm: normal sinus rhythm  QRS Axis: normal  Intervals: normal  ST/T Wave abnormalities: normal  Conduction Disutrbances: none  Narrative Interpretation:  unremarkable      Kammy Klett K Anyae Griffith-Rasch, MD 12/30/12 0407  Domonique Cothran K Karlita Lichtman-Rasch, MD 12/30/12 1610

## 2012-12-30 NOTE — ED Notes (Signed)
MD at bedside. 

## 2013-10-03 ENCOUNTER — Emergency Department (HOSPITAL_BASED_OUTPATIENT_CLINIC_OR_DEPARTMENT_OTHER)
Admission: EM | Admit: 2013-10-03 | Discharge: 2013-10-03 | Disposition: A | Discharge status: Home / Self Care | Payer: Medicaid Other | Attending: Emergency Medicine | Admitting: Emergency Medicine

## 2013-10-03 ENCOUNTER — Encounter (HOSPITAL_BASED_OUTPATIENT_CLINIC_OR_DEPARTMENT_OTHER): Payer: Self-pay | Admitting: Emergency Medicine

## 2013-10-03 ENCOUNTER — Emergency Department (HOSPITAL_BASED_OUTPATIENT_CLINIC_OR_DEPARTMENT_OTHER): Payer: Medicaid Other

## 2013-10-03 DIAGNOSIS — R1915 Other abnormal bowel sounds: Secondary | ICD-10-CM | POA: Insufficient documentation

## 2013-10-03 DIAGNOSIS — I10 Essential (primary) hypertension: Secondary | ICD-10-CM

## 2013-10-03 DIAGNOSIS — Z3202 Encounter for pregnancy test, result negative: Secondary | ICD-10-CM | POA: Insufficient documentation

## 2013-10-03 DIAGNOSIS — Z79899 Other long term (current) drug therapy: Secondary | ICD-10-CM | POA: Insufficient documentation

## 2013-10-03 DIAGNOSIS — E876 Hypokalemia: Secondary | ICD-10-CM | POA: Insufficient documentation

## 2013-10-03 DIAGNOSIS — Z792 Long term (current) use of antibiotics: Secondary | ICD-10-CM | POA: Insufficient documentation

## 2013-10-03 LAB — CBC WITH DIFFERENTIAL/PLATELET
Basophils Absolute: 0 10*3/uL (ref 0.0–0.1)
Basophils Relative: 1 % (ref 0–1)
Eosinophils Absolute: 0.1 10*3/uL (ref 0.0–0.7)
Eosinophils Relative: 2 % (ref 0–5)
HCT: 36.8 % (ref 36.0–46.0)
Hemoglobin: 12.2 g/dL (ref 12.0–15.0)
Lymphocytes Relative: 44 % (ref 12–46)
Lymphs Abs: 2.9 10*3/uL (ref 0.7–4.0)
MCH: 30.7 pg (ref 26.0–34.0)
MCHC: 33.2 g/dL (ref 30.0–36.0)
MCV: 92.5 fL (ref 78.0–100.0)
Monocytes Absolute: 0.6 10*3/uL (ref 0.1–1.0)
Monocytes Relative: 10 % (ref 3–12)
Neutro Abs: 2.9 10*3/uL (ref 1.7–7.7)
Neutrophils Relative %: 44 % (ref 43–77)
Platelets: 278 10*3/uL (ref 150–400)
RBC: 3.98 MIL/uL (ref 3.87–5.11)
RDW: 12.3 % (ref 11.5–15.5)
WBC: 6.5 10*3/uL (ref 4.0–10.5)

## 2013-10-03 LAB — BASIC METABOLIC PANEL WITH GFR
BUN: 10 mg/dL (ref 6–23)
CO2: 26 meq/L (ref 19–32)
Calcium: 8.5 mg/dL (ref 8.4–10.5)
Chloride: 100 meq/L (ref 96–112)
Creatinine, Ser: 0.8 mg/dL (ref 0.50–1.10)
GFR calc Af Amer: >90 mL/min
GFR calc non Af Amer: >90 mL/min
Glucose, Bld: 118 mg/dL — ABNORMAL HIGH (ref 70–99)
Potassium: 3.1 meq/L — ABNORMAL LOW (ref 3.7–5.3)
Sodium: 140 meq/L (ref 137–147)

## 2013-10-03 LAB — TROPONIN I: Troponin I: <0.3 ng/mL

## 2013-10-03 LAB — PREGNANCY, URINE: PREG TEST UR: NEGATIVE

## 2013-10-03 LAB — D-DIMER, QUANTITATIVE: D-Dimer, Quant: <0.27 ug/mL-FEU (ref 0.00–0.48)

## 2013-10-03 MED ORDER — POTASSIUM CHLORIDE CRYS ER 20 MEQ PO TBCR
40.0000 meq | EXTENDED_RELEASE_TABLET | Freq: Once | ORAL | Status: AC
  Administered 2013-10-03: 40 meq via ORAL
  Filled 2013-10-03: qty 2

## 2013-10-03 MED ORDER — GI COCKTAIL ~~LOC~~
30.0000 mL | Freq: Once | ORAL | Status: AC
  Administered 2013-10-03: 30 mL via ORAL
  Filled 2013-10-03: qty 30

## 2013-10-03 MED ORDER — AMLODIPINE BESYLATE 5 MG PO TABS
5.0000 mg | ORAL_TABLET | Freq: Once | ORAL | Status: AC
  Administered 2013-10-03: 5 mg via ORAL
  Filled 2013-10-03: qty 1

## 2013-10-03 NOTE — ED Notes (Addendum)
States around 6pm Sunday she started having a general h/a and dizziness. Was concerned that her b/p was "up" so she drank vinegar. States around 2am she developed some numbness/tingling to her right hand and a pain to her anterior chest and upper middle back area.  Describes as a dull constant pain. Neuro exam is normal. Denies any nausea vomiting. C/o of feeling a little sob. resp even and unlabored at present. Sinus on ekg. C/o general h/a. B/p is 209/128. Pt did not take her evening dose of her b/p med (coreg). Denies any recent travel or long trips. Denies any cardiac hx.

## 2013-10-03 NOTE — ED Notes (Signed)
Transport to xray

## 2013-10-03 NOTE — Discharge Instructions (Signed)
DASH Diet  The DASH diet stands for "Dietary Approaches to Stop Hypertension." It is a healthy eating plan that has been shown to reduce high blood pressure (hypertension) in as little as 14 days, while also possibly providing other significant health benefits. These other health benefits include reducing the risk of breast cancer after menopause and reducing the risk of type 2 diabetes, heart disease, colon cancer, and stroke. Health benefits also include weight loss and slowing kidney failure in patients with chronic kidney disease.   DIET GUIDELINES  · Limit salt (sodium). Your diet should contain less than 1500 mg of sodium daily.  · Limit refined or processed carbohydrates. Your diet should include mostly whole grains. Desserts and added sugars should be used sparingly.  · Include small amounts of heart-healthy fats. These types of fats include nuts, oils, and tub margarine. Limit saturated and trans fats. These fats have been shown to be harmful in the body.  CHOOSING FOODS   The following food groups are based on a 2000 calorie diet. See your Registered Dietitian for individual calorie needs.  Grains and Grain Products (6 to 8 servings daily)  · Eat More Often: Whole-wheat bread, brown rice, whole-grain or wheat pasta, quinoa, popcorn without added fat or salt (air popped).  · Eat Less Often: White bread, white pasta, white rice, cornbread.  Vegetables (4 to 5 servings daily)  · Eat More Often: Fresh, frozen, and canned vegetables. Vegetables may be raw, steamed, roasted, or grilled with a minimal amount of fat.  · Eat Less Often/Avoid: Creamed or fried vegetables. Vegetables in a cheese sauce.  Fruit (4 to 5 servings daily)  · Eat More Often: All fresh, canned (in natural juice), or frozen fruits. Dried fruits without added sugar. One hundred percent fruit juice (½ cup [237 mL] daily).  · Eat Less Often: Dried fruits with added sugar. Canned fruit in light or heavy syrup.  Lean Meats, Fish, and Poultry (2  servings or less daily. One serving is 3 to 4 oz [85-114 g]).  · Eat More Often: Ninety percent or leaner ground beef, tenderloin, sirloin. Round cuts of beef, chicken breast, turkey breast. All fish. Grill, bake, or broil your meat. Nothing should be fried.  · Eat Less Often/Avoid: Fatty cuts of meat, turkey, or chicken leg, thigh, or wing. Fried cuts of meat or fish.  Dairy (2 to 3 servings)  · Eat More Often: Low-fat or fat-free milk, low-fat plain or light yogurt, reduced-fat or part-skim cheese.  · Eat Less Often/Avoid: Milk (whole, 2%). Whole milk yogurt. Full-fat cheeses.  Nuts, Seeds, and Legumes (4 to 5 servings per week)  · Eat More Often: All without added salt.  · Eat Less Often/Avoid: Salted nuts and seeds, canned beans with added salt.  Fats and Sweets (limited)  · Eat More Often: Vegetable oils, tub margarines without trans fats, sugar-free gelatin. Mayonnaise and salad dressings.  · Eat Less Often/Avoid: Coconut oils, palm oils, butter, stick margarine, cream, half and half, cookies, candy, pie.  FOR MORE INFORMATION  The Dash Diet Eating Plan: www.dashdiet.org  Document Released: 05/08/2011 Document Revised: 08/11/2011 Document Reviewed: 05/08/2011  Exit[employer]® Patient Information ©2014 [employer].  DASH Diet  The DASH diet stands for "Dietary Approaches to Stop Hypertension." It is a healthy eating plan that has been shown to reduce high blood pressure (hypertension) in as little as 14 days, while also possibly providing other significant health benefits. These other health benefits include reducing the risk of breast cancer after   menopause and reducing the risk of type 2 diabetes, heart disease, colon cancer, and stroke. Health benefits also include weight loss and slowing kidney failure in patients with chronic kidney disease.   DIET GUIDELINES  · Limit salt (sodium). Your diet should contain less than 1500 mg of sodium daily.  · Limit refined or processed carbohydrates. Your diet should include  mostly whole grains. Desserts and added sugars should be used sparingly.  · Include small amounts of heart-healthy fats. These types of fats include nuts, oils, and tub margarine. Limit saturated and trans fats. These fats have been shown to be harmful in the body.  CHOOSING FOODS   The following food groups are based on a 2000 calorie diet. See your Registered Dietitian for individual calorie needs.  Grains and Grain Products (6 to 8 servings daily)  · Eat More Often: Whole-wheat bread, brown rice, whole-grain or wheat pasta, quinoa, popcorn without added fat or salt (air popped).  · Eat Less Often: White bread, white pasta, white rice, cornbread.  Vegetables (4 to 5 servings daily)  · Eat More Often: Fresh, frozen, and canned vegetables. Vegetables may be raw, steamed, roasted, or grilled with a minimal amount of fat.  · Eat Less Often/Avoid: Creamed or fried vegetables. Vegetables in a cheese sauce.  Fruit (4 to 5 servings daily)  · Eat More Often: All fresh, canned (in natural juice), or frozen fruits. Dried fruits without added sugar. One hundred percent fruit juice (½ cup [237 mL] daily).  · Eat Less Often: Dried fruits with added sugar. Canned fruit in light or heavy syrup.  Lean Meats, Fish, and Poultry (2 servings or less daily. One serving is 3 to 4 oz [85-114 g]).  · Eat More Often: Ninety percent or leaner ground beef, tenderloin, sirloin. Round cuts of beef, chicken breast, turkey breast. All fish. Grill, bake, or broil your meat. Nothing should be fried.  · Eat Less Often/Avoid: Fatty cuts of meat, turkey, or chicken leg, thigh, or wing. Fried cuts of meat or fish.  Dairy (2 to 3 servings)  · Eat More Often: Low-fat or fat-free milk, low-fat plain or light yogurt, reduced-fat or part-skim cheese.  · Eat Less Often/Avoid: Milk (whole, 2%). Whole milk yogurt. Full-fat cheeses.  Nuts, Seeds, and Legumes (4 to 5 servings per week)  · Eat More Often: All without added salt.  · Eat Less Often/Avoid: Salted  nuts and seeds, canned beans with added salt.  Fats and Sweets (limited)  · Eat More Often: Vegetable oils, tub margarines without trans fats, sugar-free gelatin. Mayonnaise and salad dressings.  · Eat Less Often/Avoid: Coconut oils, palm oils, butter, stick margarine, cream, half and half, cookies, candy, pie.  FOR MORE INFORMATION  The Dash Diet Eating Plan: www.dashdiet.org  Document Released: 05/08/2011 Document Revised: 08/11/2011 Document Reviewed: 05/08/2011  Exit[employer]® Patient Information ©2014 [employer].

## 2013-10-03 NOTE — ED Provider Notes (Signed)
CSN: [id number]     Arrival date & time 10/03/13  0231 History   First MD Initiated Contact with Patient 10/03/13 0321     Chief Complaint  Patient presents with  . dizzy/numbness in right arm      (Consider location/radiation/quality/duration/timing/severity/associated sxs/prior Treatment) Patient is a 38 y.o. female presenting with hypertension. The history is provided by the patient. No language interpreter was used.  Hypertension This is a chronic problem. The current episode started more than 1 week ago. The problem occurs constantly. The problem has been gradually worsening. Pertinent negatives include no chest pain, no abdominal pain, no headaches and no shortness of breath. Nothing aggravates the symptoms. Nothing relieves the symptoms. She has tried nothing for the symptoms. The treatment provided no relief.  Missed doses of Coreg and was changed from her ARB to lisinopril which she said did not work in the past.  Has burning in the back but denies chest pain.  No weakness no numbness.  No changes in vision or speech.  No HA. NO DOE no SOB no palpitations no swelling or pain in the legs.  Arm is not tingling per patient report.    Past Medical History  Diagnosis Date  . Hypertension    History reviewed. No pertinent past surgical history. No family history on file. History  Substance Use Topics  . Smoking status: Never Smoker   . Smokeless tobacco: Not on file  . Alcohol Use: Yes     Comment: occasional    OB History   Grav Para Term Preterm Abortions TAB SAB Ect Mult Living                 Review of Systems  Constitutional: Negative for diaphoresis.  Eyes: Negative for photophobia.  Respiratory: Negative for shortness of breath.   Cardiovascular: Negative for chest pain, palpitations and leg swelling.  Gastrointestinal: Negative for abdominal pain.  Musculoskeletal: Negative for gait problem.  Neurological: Negative for dizziness, tremors, seizures, syncope, facial  asymmetry, speech difficulty, weakness, light-headedness, numbness and headaches.  All other systems reviewed and are negative.     Allergies  Review of patient's allergies indicates no known allergies.  Home Medications   Prior to Admission medications   Medication Sig Start Date End Date Taking? Authorizing Provider  LISINOPRIL PO Take by mouth.   Yes Historical Provider, MD  Aspirin-Acetaminophen-Caffeine (GOODY HEADACHE PO) Take 1 packet by mouth daily as needed.    Historical Provider, MD  carvedilol (COREG) 12.5 MG tablet Take 12.5 mg by mouth daily.     Historical Provider, MD  ferrous sulfate 325 (65 FE) MG tablet Take 325 mg by mouth daily with breakfast.    Historical Provider, MD  ibuprofen (ADVIL,MOTRIN) 400 MG tablet Take 1 tablet (400 mg total) by mouth every 6 (six) hours as needed for pain. 12/30/12   Kron Everton K Desarae Placide-Rasch, MD  nitrofurantoin, macrocrystal-monohydrate, (MACROBID) 100 MG capsule Take 1 capsule (100 mg total) by mouth 2 (two) times daily. X 7 days 12/30/12   Torin Whisner K Gyasi Hazzard-Rasch, MD  oxyCODONE-acetaminophen (PERCOCET/ROXICET) 5-325 MG per tablet Take 1-2 tablets by mouth every 6 (six) hours as needed for pain. 11/09/12   Charles B. Bernette MayersSheldon, MD  potassium chloride (K-DUR) 10 MEQ tablet Take 10 mEq by mouth 2 (two) times daily.    Historical Provider, MD  valsartan-hydrochlorothiazide (DIOVAN HCT) 80-12.5 MG per tablet Take 1 tablet by mouth daily. 12/30/12   Jerrian Mells K Landrey Mahurin-Rasch, MD  valsartan-hydrochlorothiazide (DIOVAN-HCT) 80-12.5 MG per tablet  Take 1 tablet by mouth daily.    Historical Provider, MD   BP 209/128  Pulse 69  Temp(Src) 98.5 F (36.9 C) (Oral)  Resp 24  Ht 5\' 2"  (1.575 m)  Wt 169 lb (76.658 kg)  BMI 30.90 kg/m2  SpO2 100%  LMP 09/04/2013 Physical Exam  Constitutional: She is oriented to person, place, and time. She appears well-developed and well-nourished. No distress.  HENT:  Head: Normocephalic and atraumatic.  Mouth/Throat:  Oropharynx is clear and moist. No oropharyngeal exudate.  Eyes: Conjunctivae and EOM are normal. Pupils are equal, round, and reactive to light.  Neck: Normal range of motion. Neck supple. No JVD present.  Cardiovascular: Normal rate, regular rhythm and intact distal pulses.   Pulmonary/Chest: Effort normal and breath sounds normal. No stridor. She has no wheezes. She has no rales.  Abdominal: Soft. Bowel sounds are increased. There is no tenderness. There is no rebound and no guarding.  Musculoskeletal: Normal range of motion. She exhibits no edema and no tenderness.  Neurological: She is alert and oriented to person, place, and time. She has normal reflexes. She displays no atrophy, no tremor and normal reflexes. No cranial nerve deficit or sensory deficit. She exhibits normal muscle tone. She displays no seizure activity. Coordination normal. GCS eye subscore is 4. GCS verbal subscore is 5. GCS motor subscore is 6.  Cranial nerves 2-12 tested and intact, 5/5 strength in all extremities and intact sensation.    Skin: Skin is warm and dry. She is not diaphoretic.  Psychiatric: She has a normal mood and affect.    ED Course  Procedures (including critical care time) Labs Review Labs Reviewed  CBC WITH DIFFERENTIAL  TROPONIN I  PREGNANCY, URINE  D-DIMER, QUANTITATIVE  BASIC METABOLIC PANEL    Imaging Review No results found.   EKG Interpretation   Date/Time:  Monday Oct 03 2013 03:13:11 EDT Ventricular Rate:  60 PR Interval:  170 QRS Duration: 82 QT Interval:  452 QTC Calculation: 452 R Axis:   55 Text Interpretation:  Normal sinus rhythm Confirmed by [facility]  MD,  Keeya Dyckman ([id number]) on 10/03/2013 4:08:12 AM      MDM   Final diagnoses:  None  Hypertension  Patient has been here several times in the past for similar complaints.    Doubt cardiac or pulmonary etiology.  In the setting of a low risk patient with normal chest XRAY and negative DDImer PE and dissection are  very unlikely.  Has symmetric BP in BUE.  DOubt cardiac etiology, EKG is normal with normal troponin in the setting of ongoing symptoms.  Patient needs to take her medications without missing dosages as this can lead to rebound hypertension.  Since BP continues to be elevated she needs to follow up with her family doctor for dose adjustment or change in medications.      Jasmine AweApril K Dayln Tugwell-Rasch, MD 10/03/13 [phone]

## 2013-10-03 NOTE — ED Notes (Signed)
I took second BP on other arm, got second reading of 209/128.

## 2013-10-03 NOTE — ED Notes (Signed)
Patient stated in triage room, that she is also having chest pain, stated she had dizziness about 6 PM this evening, then chest pains, back pains and numbness around 2 AM. I got wheelchair and took her to room, took initial vitals in room. Blood pressure high, I told nurse same.

## 2013-10-03 NOTE — ED Notes (Signed)
Returned from xray

## 2014-08-08 ENCOUNTER — Emergency Department (HOSPITAL_BASED_OUTPATIENT_CLINIC_OR_DEPARTMENT_OTHER)
Admission: EM | Admit: 2014-08-08 | Discharge: 2014-08-08 | Disposition: A | Discharge status: Home / Self Care | Payer: Medicaid Other | Attending: Emergency Medicine | Admitting: Emergency Medicine

## 2014-08-08 ENCOUNTER — Emergency Department (HOSPITAL_BASED_OUTPATIENT_CLINIC_OR_DEPARTMENT_OTHER): Payer: Medicaid Other

## 2014-08-08 ENCOUNTER — Encounter (HOSPITAL_BASED_OUTPATIENT_CLINIC_OR_DEPARTMENT_OTHER): Payer: Self-pay | Admitting: Emergency Medicine

## 2014-08-08 DIAGNOSIS — Z79899 Other long term (current) drug therapy: Secondary | ICD-10-CM | POA: Diagnosis not present

## 2014-08-08 DIAGNOSIS — R110 Nausea: Secondary | ICD-10-CM | POA: Diagnosis not present

## 2014-08-08 DIAGNOSIS — I10 Essential (primary) hypertension: Secondary | ICD-10-CM | POA: Diagnosis not present

## 2014-08-08 DIAGNOSIS — R0602 Shortness of breath: Secondary | ICD-10-CM | POA: Diagnosis not present

## 2014-08-08 DIAGNOSIS — R079 Chest pain, unspecified: Secondary | ICD-10-CM | POA: Insufficient documentation

## 2014-08-08 DIAGNOSIS — M791 Myalgia: Secondary | ICD-10-CM | POA: Diagnosis not present

## 2014-08-08 DIAGNOSIS — R51 Headache: Secondary | ICD-10-CM | POA: Insufficient documentation

## 2014-08-08 DIAGNOSIS — R6889 Other general symptoms and signs: Secondary | ICD-10-CM

## 2014-08-08 LAB — URINALYSIS, ROUTINE W REFLEX MICROSCOPIC
BILIRUBIN URINE: NEGATIVE
Glucose, UA: NEGATIVE mg/dL
Ketones, ur: NEGATIVE mg/dL
Leukocytes, UA: NEGATIVE
Nitrite: NEGATIVE
Protein, ur: NEGATIVE mg/dL
SPECIFIC GRAVITY, URINE: 1.002 — AB (ref 1.005–1.030)
UROBILINOGEN UA: 0.2 mg/dL (ref 0.0–1.0)
pH: 6.5 (ref 5.0–8.0)

## 2014-08-08 LAB — URINE MICROSCOPIC-ADD ON

## 2014-08-08 MED ORDER — IBUPROFEN 400 MG PO TABS
600.0000 mg | ORAL_TABLET | Freq: Once | ORAL | Status: AC
  Administered 2014-08-08: 600 mg via ORAL
  Filled 2014-08-08 (×2): qty 1

## 2014-08-08 MED ORDER — ACETAMINOPHEN 500 MG PO TABS
1000.0000 mg | ORAL_TABLET | Freq: Four times a day (QID) | ORAL | Status: DC | PRN
Start: 2014-08-08 — End: 2014-08-09
  Administered 2014-08-08: 1000 mg via ORAL
  Filled 2014-08-08: qty 2

## 2014-08-08 MED ORDER — ONDANSETRON 4 MG PO TBDP: ORAL_TABLET | ORAL | Status: DC

## 2014-08-08 MED ORDER — ONDANSETRON 4 MG PO TBDP
4.0000 mg | ORAL_TABLET | Freq: Once | ORAL | Status: AC
  Administered 2014-08-08: 4 mg via ORAL
  Filled 2014-08-08: qty 1

## 2014-08-08 NOTE — ED Notes (Addendum)
C/o body aches, fever cough, soreness x 2 days, back pain  Denies congestion

## 2014-08-08 NOTE — ED Notes (Signed)
Pt states she has had cough since yesterday and this morning woke up with achy feeling all over.

## 2014-08-08 NOTE — ED Provider Notes (Signed)
CSN: [id number]     Arrival date & time 08/08/14  1946 History  This chart was scribed for Pricilla Loveless, MD by Abel Presto, ED Scribe. This patient was seen in room MH05/MH05 and the patient's care was started at 9:27 PM.    Chief Complaint  Patient presents with  . Cough and SOB      The history is provided by the patient. No language interpreter was used.   HPI Comments: Jennifer Mcgrath is a 39 y.o. female with PMHx of HTN who presents to the Emergency Department complaining of cough with onset yesterday. Pt notes associated body aches, fever, chills, headache, chest tightness, and nausea. Pt is utd with flu shot. Pt denies SOB, vomiting, diarrhea, abdominal pain, dysuria, and sore throat.    Past Medical History  Diagnosis Date  . Hypertension    History reviewed. No pertinent past surgical history. History reviewed. No pertinent family history. History  Substance Use Topics  . Smoking status: Never Smoker   . Smokeless tobacco: Not on file  . Alcohol Use: Yes     Comment: occasional    OB History    No data available     Review of Systems  Constitutional: Positive for fever and chills.  HENT: Negative for sore throat.   Respiratory: Positive for cough and chest tightness. Negative for shortness of breath.   Gastrointestinal: Positive for nausea. Negative for vomiting, abdominal pain and diarrhea.  Genitourinary: Negative for dysuria.  Musculoskeletal: Positive for myalgias.  Neurological: Positive for headaches.  All other systems reviewed and are negative.     Allergies  Review of patient's allergies indicates no known allergies.  Home Medications   Prior to Admission medications   Medication Sig Start Date End Date Taking? Authorizing Provider  Aspirin-Acetaminophen-Caffeine (GOODY HEADACHE PO) Take 1 packet by mouth daily as needed.    Historical Provider, MD  carvedilol (COREG) 12.5 MG tablet Take 12.5 mg by mouth daily.     Historical Provider, MD   ferrous sulfate 325 (65 FE) MG tablet Take 325 mg by mouth daily with breakfast.    Historical Provider, MD  ibuprofen (ADVIL,MOTRIN) 400 MG tablet Take 1 tablet (400 mg total) by mouth every 6 (six) hours as needed for pain. 12/30/12   April Palumbo, MD  LISINOPRIL PO Take by mouth.    Historical Provider, MD  nitrofurantoin, macrocrystal-monohydrate, (MACROBID) 100 MG capsule Take 1 capsule (100 mg total) by mouth 2 (two) times daily. X 7 days 12/30/12   April Palumbo, MD  oxyCODONE-acetaminophen (PERCOCET/ROXICET) 5-325 MG per tablet Take 1-2 tablets by mouth every 6 (six) hours as needed for pain. 11/09/12   Susy Frizzle, MD  potassium chloride (K-DUR) 10 MEQ tablet Take 10 mEq by mouth 2 (two) times daily.    Historical Provider, MD  valsartan-hydrochlorothiazide (DIOVAN HCT) 80-12.5 MG per tablet Take 1 tablet by mouth daily. 12/30/12   April Palumbo, MD  valsartan-hydrochlorothiazide (DIOVAN-HCT) 80-12.5 MG per tablet Take 1 tablet by mouth daily.    Historical Provider, MD   BP 119/78 mmHg  Pulse 87  Temp(Src) 102.9 F (39.4 C) (Oral)  Resp 20  Ht  (1.575 m)  Wt 169 lb (76.658 kg)  BMI 30.90 kg/m2  SpO2 100%  LMP 07/28/2014 Physical Exam  Constitutional: She is oriented to person, place, and time. She appears well-developed and well-nourished.  HENT:  Head: Normocephalic.  Mouth/Throat: Oropharynx is clear and moist. No oropharyngeal exudate.  Eyes: Conjunctivae are normal. Pupils are equal,  round, and reactive to light.  Neck: Normal range of motion. Neck supple.  Cardiovascular: Normal rate, regular rhythm and normal heart sounds.  Exam reveals no friction rub.   No murmur heard. Pulmonary/Chest: Effort normal and breath sounds normal. No respiratory distress. She has no wheezes. She has no rales.  Abdominal: She exhibits no distension. There is no tenderness. There is no CVA tenderness.  Musculoskeletal: Normal range of motion.  Neurological: She is alert and oriented  to person, place, and time.  Skin: Skin is warm and dry.  Psychiatric: She has a normal mood and affect. Her behavior is normal.  Nursing note and vitals reviewed.   ED Course  Procedures (including critical care time) DIAGNOSTIC STUDIES: Oxygen Saturation is 100% on room air, normal by my interpretation.    COORDINATION OF CARE: 9:31 PM Discussed treatment plan with patient at beside, the patient agrees with the plan and has no further questions at this time.   Labs Review Labs Reviewed  URINALYSIS, ROUTINE W REFLEX MICROSCOPIC - Abnormal; Notable for the following:    Specific Gravity, Urine 1.002 (*)    Hgb urine dipstick LARGE (*)    All other components within normal limits  URINE MICROSCOPIC-ADD ON    Imaging Review Dg Chest 2 View  08/08/2014   CLINICAL DATA:  Cough and body aches for 1 day. Upper chest pain radiating to the back.  EXAM: CHEST  2 VIEW  COMPARISON:  10/03/2013  FINDINGS: The cardiomediastinal contours are unchanged, heart at the upper limits of normal in size. The lungs are clear. Pulmonary vasculature is normal. No consolidation, pleural effusion, or pneumothorax. No acute osseous abnormalities are seen.  IMPRESSION: No acute pulmonary process.   Electronically Signed   By: Rubye OaksMelanie  Ehinger M.D.   On: 08/08/2014 21:32     EKG Interpretation None      MDM   Final diagnoses:  Flu-like symptoms  Nausea    Patient symptoms are consistent with a flulike illness. Patient symptoms have been going on for approximately 48 hours, so I did offer her Tamiflu. After discussions of risks and benefits she declines at this time. Feels better after nausea. Chest x-ray is unremarkable. At this point she is well-appearing and feels better after antipyretics. Skin supportive care, fluids, and return precautions.  I personally performed the services described in this documentation, which was scribed in my presence. The recorded information has been reviewed and is  accurate.     Pricilla LovelessScott Cigi Bega, MD 08/08/14 [phone]

## 2015-04-02 ENCOUNTER — Emergency Department (HOSPITAL_BASED_OUTPATIENT_CLINIC_OR_DEPARTMENT_OTHER): Payer: Medicaid Other

## 2015-04-02 ENCOUNTER — Encounter (HOSPITAL_BASED_OUTPATIENT_CLINIC_OR_DEPARTMENT_OTHER): Payer: Self-pay

## 2015-04-02 ENCOUNTER — Emergency Department (HOSPITAL_BASED_OUTPATIENT_CLINIC_OR_DEPARTMENT_OTHER)
Admission: EM | Admit: 2015-04-02 | Discharge: 2015-04-02 | Disposition: A | Discharge status: Home / Self Care | Payer: Medicaid Other | Attending: Emergency Medicine | Admitting: Emergency Medicine

## 2015-04-02 DIAGNOSIS — Y998 Other external cause status: Secondary | ICD-10-CM | POA: Insufficient documentation

## 2015-04-02 DIAGNOSIS — S99921A Unspecified injury of right foot, initial encounter: Secondary | ICD-10-CM | POA: Diagnosis not present

## 2015-04-02 DIAGNOSIS — I10 Essential (primary) hypertension: Secondary | ICD-10-CM | POA: Diagnosis not present

## 2015-04-02 DIAGNOSIS — Y9389 Activity, other specified: Secondary | ICD-10-CM | POA: Insufficient documentation

## 2015-04-02 DIAGNOSIS — Y9289 Other specified places as the place of occurrence of the external cause: Secondary | ICD-10-CM | POA: Diagnosis not present

## 2015-04-02 DIAGNOSIS — R200 Anesthesia of skin: Secondary | ICD-10-CM | POA: Diagnosis not present

## 2015-04-02 DIAGNOSIS — M25571 Pain in right ankle and joints of right foot: Secondary | ICD-10-CM

## 2015-04-02 DIAGNOSIS — S99911A Unspecified injury of right ankle, initial encounter: Secondary | ICD-10-CM | POA: Diagnosis not present

## 2015-04-02 DIAGNOSIS — S99912A Unspecified injury of left ankle, initial encounter: Secondary | ICD-10-CM | POA: Diagnosis not present

## 2015-04-02 DIAGNOSIS — S99922A Unspecified injury of left foot, initial encounter: Secondary | ICD-10-CM | POA: Insufficient documentation

## 2015-04-02 DIAGNOSIS — M25572 Pain in left ankle and joints of left foot: Secondary | ICD-10-CM

## 2015-04-02 DIAGNOSIS — Z791 Long term (current) use of non-steroidal anti-inflammatories (NSAID): Secondary | ICD-10-CM | POA: Diagnosis not present

## 2015-04-02 DIAGNOSIS — W010XXA Fall on same level from slipping, tripping and stumbling without subsequent striking against object, initial encounter: Secondary | ICD-10-CM | POA: Insufficient documentation

## 2015-04-02 MED ORDER — ACETAMINOPHEN 325 MG PO TABS
650.0000 mg | ORAL_TABLET | Freq: Once | ORAL | Status: AC
  Administered 2015-04-02: 650 mg via ORAL
  Filled 2015-04-02: qty 2

## 2015-04-02 MED ORDER — IBUPROFEN 800 MG PO TABS: 800.0000 mg | ORAL_TABLET | Freq: Three times a day (TID) | ORAL | Status: DC

## 2015-04-02 NOTE — ED Notes (Signed)
Pt c/o pain to both feet after stepping off porch yesterday-pt drove self here

## 2015-04-02 NOTE — ED Notes (Signed)
PA at bedside.

## 2015-04-02 NOTE — Discharge Instructions (Signed)
1. Medications: ibuprofen, usual home medications 2. Treatment: rest, drink plenty of fluids, ice, elevate, wear ankle brace 3. Follow Up: please followup with your primary doctor for discussion of your diagnoses and further evaluation after today's visit; if you do not have a primary care doctor use the resource guide provided to find one; please return to the ER for severe pain, numbness color change to extremities, new or worsening symptoms   Musculoskeletal Pain Musculoskeletal pain is muscle and boney aches and pains. These pains can occur in any part of the body. Your caregiver may treat you without knowing the cause of the pain. They may treat you if blood or urine tests, X-rays, and other tests were normal.  CAUSES There is often not a definite cause or reason for these pains. These pains may be caused by a type of germ (virus). The discomfort may also come from overuse. Overuse includes working out too hard when your body is not fit. Boney aches also come from weather changes. Bone is sensitive to atmospheric pressure changes. HOME CARE INSTRUCTIONS   Ask when your test results will be ready. Make sure you get your test results.  Only take over-the-counter or prescription medicines for pain, discomfort, or fever as directed by your caregiver. If you were given medications for your condition, do not drive, operate machinery or power tools, or sign legal documents for 24 hours. Do not drink alcohol. Do not take sleeping pills or other medications that may interfere with treatment.  Continue all activities unless the activities cause more pain. When the pain lessens, slowly resume normal activities. Gradually increase the intensity and duration of the activities or exercise.  During periods of severe pain, bed rest may be helpful. Lay or sit in any position that is comfortable.  Putting ice on the injured area.  Put ice in a bag.  Place a towel between your skin and the bag.  Leave the  ice on for 15 to 20 minutes, 3 to 4 times a day.  Follow up with your caregiver for continued problems and no reason can be found for the pain. If the pain becomes worse or does not go away, it may be necessary to repeat tests or do additional testing. Your caregiver may need to look further for a possible cause. SEEK IMMEDIATE MEDICAL CARE IF:  You have pain that is getting worse and is not relieved by medications.  You develop chest pain that is associated with shortness or breath, sweating, feeling sick to your stomach (nauseous), or throw up (vomit).  Your pain becomes localized to the abdomen.  You develop any new symptoms that seem different or that concern you. MAKE SURE YOU:   Understand these instructions.  Will watch your condition.  Will get help right away if you are not doing well or get worse.   This information is not intended to replace advice given to you by your health care provider. Make sure you discuss any questions you have with your health care provider.   Document Released: 05/19/2005 Document Revised: 08/11/2011 Document Reviewed: 01/21/2013 Elsevier Interactive Patient Education 2016 [employer]   Emergency Department Resource Guide 1) Find a Doctor and Pay Out of Pocket Although you won't have to find out who is covered by your insurance plan, it is a good idea to ask around and get recommendations. You will then need to call the office and see if the doctor you have chosen will accept you as a new patient and  what types of options they offer for patients who are self-pay. Some doctors offer discounts or will set up payment plans for their patients who do not have insurance, but you will need to ask so you aren't surprised when you get to your appointment.  2) Contact Your Local Health Department Not all health departments have doctors that can see patients for sick visits, but many do, so it is worth a call to see if yours does. If you don't know where  your local health department is, you can check in your phone book. The CDC also has a tool to help you locate your state's health department, and many state websites also have listings of all of their local health departments.  3) Find a Walk-in Clinic If your illness is not likely to be very severe or complicated, you may want to try a walk in clinic. These are popping up all over the country in pharmacies, drugstores, and shopping centers. They're usually staffed by nurse practitioners or physician assistants that have been trained to treat common illnesses and complaints. They're usually fairly quick and inexpensive. However, if you have serious medical issues or chronic medical problems, these are probably not your best option.  No Primary Care Doctor: - Call Health Connect at  [phone] - they can help you locate a primary care doctor that  accepts your insurance, provides certain services, etc. - Physician Referral Service- [phone]  Chronic Pain Problems: Organization         Address  Phone   Notes  [facility]  [phone] Patients need to be referred by their primary care doctor.   Medication Assistance: Organization         Address  Phone   Notes  [facility] Medication [facility] [address] [place], [place] [address] [phone] --Must be a resident of [facility] -- Must have NO insurance coverage whatsoever (no Medicaid/ Medicare, etc.) -- The pt. MUST have a primary care doctor that directs their care regularly and follows them in the community   [facility]ist  [phone]   [employer]  [phone]    Agencies that provide inexpensive medical care: Organization         Address  Phone   Notes  [facility]  [phone]   [facility]    [phone]   [facility] [address] [place], [place] [address] [phone]   [facility] [address]  [place], [place] [phone]   Planned Parenthood    [phone]   [facility]    [phone]   Community Health and [facility]  [address], [place] Phone:  [phone], Fax:  [phone] Hours of Operation:  9 am - 6 pm, M-F.  Also accepts Medicaid/Medicare and self-pay.  [facility]  [address],  Phone: [phone], Fax: [phone]. Hours of Operation:  8:30 am - 5:30 pm, M-F.  Also accepts Medicaid and self-pay.  [facility] [place] [address] [place] Phone: [phone]   [facility] [address] Natasha Bence [place], [place] [phone], Ext. [phone] Mondays & Thursdays: 7-9 AM.  First 15 patients are seen on a first come, first serve basis.    Medicaid-accepting [facility] Providers:  Retail buyer  Notes  [facility] [address], [place] [phone] Also accepts self-pay patients.  [facility] [address], [place]  [phone]) [phone]   [facility] [address], [place] [phone]) [phone]   [facility] [address] [place] [address], [place] [phone]   Renaye RakersVeita Bland [address], [place]   [phone] Only accepts [place] Access [place] patients after they have their name applied to their card.   Self-Pay (no insurance) in [facility]:  Organization         Address  Phone   Notes  Sickle Cell Patients, [facility] [address], [place] [phone]) [phone]   [facility] [address], [place] [phone]   [facility]  [address], [place] [phone]   [facility]/Dr. Osei-Bonsu  [address] [place] [address], [place] or [phone], [place] [phone]) [phone] Phone number for both [place] and [place] locations is the same.  [facility]gent Medical and [facility] [address], [place] [phone]) [phone]   [facility] [address] [place] [address], [place] or [address] [phone]   [facility] [address], [place] [phone]) [phone], phone; [phone](336) [phone], fax Sees patients 1st and 3rd Saturday of every month.  Must not qualify for public or private insurance (i.e. Medicaid, Medicare, [place] Health Choice, Veterans' Benefits)  Household income should be no more than 200% of the poverty level The clinic cannot treat you if you are pregnant or think you are pregnant  Sexually transmitted diseases are not treated at the clinic.    Dental Care: Organization         Address  Phone  Notes  [facility] [address], [place] [phone]) [phone] Accepts children up to age 921 who are enrolled in [place]icaid or [place] Health Choice; pregnant women with a Medicaid card; and children who have applied for Medicaid or [place] Health Choice, but were declined, whose parents can pay a reduced fee at time of service.  [facility] [place]  [address], [place] [phone]) [phone] Accepts children up to age 41 who are enrolled in [place]icaid or [place] Health Choice; pregnant women with a Medicaid card; and children who have applied for Medicaid or [place] Health Choice, but were declined, whose parents can pay a reduced fee at time of service.  [facility]  [address], [place] [phone]) [phone] Patients are seen by appointment only. Walk-ins are not accepted. [facility] will see patients 41 years of age and older. Monday - Tuesday (8am-5pm) Most Wednesdays (8:30-5pm) $30 per visit, cash only  [facility]  [address], [facility] [place] [phone]) [phone] Patients are seen by appointment only. Walk-ins are not  accepted. [facility] will see patients 41 years of age and older. One Wednesday Evening (Monthly: Volunteer Based).  $30 per visit, cash only  [employer] [facility]  [phone] for adults; Children under age 804, call Graduate Pediatric Dentistry at [phone]) [phone]. Children aged 524-14, please call [phone] to request a pediatric application.  Dental services are provided in all areas of dental care including fillings, crowns and bridges, complete  and partial dentures, implants, gum treatment, root canals, and extractions. Preventive care is also provided. Treatment is provided to both adults and children. Patients are selected via a lottery and there is often a waiting list.   [facility] [address], [place]  [phone]) [phone] [email or web address]   [facility] [address], [place], [place] [phone], Ext. [phone] Second and Fourth Thursday of each month, opens at 6:30 AM; Clinic ends at 9 AM.  Patients are seen on a first-come first-served basis, and a limited number are seen during each clinic.   [facility]  [address], [place], [place] [phone]) [phone]   Eligibility Requirements You must have lived in [place], [place], or [place] counties for at least the last three months.   You cannot be eligible for state or federal sponsored National Cityhealthcare insurance, including CIGNAVeterans Administration, [place]icaid, or [employer] EntertainmentMedicare.   You generally cannot be eligible for healthcare insurance through your employer.    How to apply: Eligibility screenings are held every Tuesday and Wednesday afternoon from 1:00 pm until 4:00 pm. You do not need an appointment for the interview!  [facility] [address], [place], [place] [phone]   [facility]  [phone]   [facility]  [phone]   [facility]  [phone]    Behavioral Health Resources in the  Community: Intensive Outpatient Programs Organization         Address  Phone  Notes  [facility] [address], [place], [place] [phone]   [facility] [address], [place], [place] [phone]   [facility] [address], [place], [place]  [phone]   [facility] [address],  [place], [place] [phone] or [phone]   Substance Abuse Resources Organization         Address  Phone  Notes  [facility]  [phone]   [facility]  [phone]   [facility]  [phone]   [facility]  [phone]   Residential & Outpatient Substance Abuse Program  [phone]   Psychological Services Organization         Address  Phone  Notes  [facility]  [phone]- [phone]   [facility]  [phone]- [phone]   [facility] [address], [place] [phone] or [phone]    Mobile Crisis Teams Organization         Address  Phone  Notes  [facility], Mobile Crisis Care Unit  [phone]   [facility]  [address] [place], [place] [phone]   Doristine LocksSharon DeEsch [address] [place] [phone]    Self-Help/Support Groups Organization         Address  Phone             Notes  [facility] [place] - variety of support groups  [phone] Call for more information  Narcotics Anonymous (NA), [facility] [address], [place]  2 meetings at this location   Statisticianesidential Treatment Programs Organization         Address  Phone  Notes  [facility] [address],    [place]  [phone]   [facility]  [address], [place] [phone], [place], [place] [phone]   [facility] [address], [place] [phone] Admissions: 8am-3pm M-F  [facility] [address],    [place], [place] [phone]   [facility] [address] [place], [place], [place] [phone]   [facility] [address],  [place], [place] [phone]   [facility] - Intensive Outpatient [address], [place], [place] [phone]   [facility] [address] [place]  [place], [place] [phone] or [phone]   [facility] ([facility]) [address] [place], [place] [phone] Accepts Medicaid  [facility] [address],  [place] [phone] Substance Abuse/Addiction Treatment   [facility] Organization         Address  Phone  Notes  [facility]  [phone]   Angie Fava, PhD [address]vin Knack [place], [place]   [phone] or [phone]   [facility]   [address] [place], [place] [phone]   [facility] [address] [place], [place] [phone] Insurance/Medicaid/sponsorship through [facility] and Families [address]                                    [place], [place] [phone] Therapy/tele-psych/case  [facility] [address], [place] [phone]    Dr. Lolly Mustache  [phone]   [facility]  [employer] [facility] Dept. 1) [address] [place] 2) [address], [place] 3)  [address], [place] [phone]  [phone]   [facility] Child Abuse Hotline [phone] or [phone] (After Hours)

## 2015-04-02 NOTE — ED Notes (Signed)
Patient drove herself to ED. I helped her from car to wheelchair.

## 2015-04-02 NOTE — ED Provider Notes (Signed)
CSN: [id number]     Arrival date & time 04/02/15  1138 History   First MD Initiated Contact with Patient 04/02/15 1148     Chief Complaint  Patient presents with  . Foot Injury    HPI   Jennifer Mcgrath is a 39 y.o. female with a PMH of HTN who presents to the ED with bilateral foot pain after slipping around 1 AM this morning. She states she was stepping off of her porch, when she twisted her left ankle and fell, landing on her right ankle. She reports constant pain since that time. She states movement exacerbates her pain. She has tried ibuprofen for symptom relief, which has helped. She reports numbness to her posterior right leg. She denies paresthesia, weakness, additional injury.   Past Medical History  Diagnosis Date  . Hypertension    History reviewed. No pertinent past surgical history. No family history on file. Social History  Substance Use Topics  . Smoking status: Never Smoker   . Smokeless tobacco: None  . Alcohol Use: Yes     Comment: occasional    OB History    No data available      Review of Systems  Musculoskeletal: Positive for myalgias, joint swelling and arthralgias.  Skin: Negative for color change.  Neurological: Positive for numbness. Negative for weakness.      Allergies  Review of patient's allergies indicates no known allergies.  Home Medications   Prior to Admission medications   Medication Sig Start Date End Date Taking? Authorizing Provider  Amlodipine Besylate-Valsartan (EXFORGE PO) Take by mouth.   Yes Historical Provider, MD  Nebivolol HCl (BYSTOLIC PO) Take by mouth.   Yes Historical Provider, MD  Aspirin-Acetaminophen-Caffeine (GOODY HEADACHE PO) Take 1 packet by mouth daily as needed.    Historical Provider, MD  ibuprofen (ADVIL,MOTRIN) 800 MG tablet Take 1 tablet (800 mg total) by mouth 3 (three) times daily. 04/02/15   Mady Gemma, PA-C   BP 138/83 mmHg  Pulse 78  Temp(Src) 99 F (37.2 C) (Oral)  Resp 18  Ht   (1.575 m)  Wt 174 lb (78.926 kg)  BMI 31.82 kg/m2  SpO2 99%  LMP 03/21/2015 Physical Exam  Constitutional: She is oriented to person, place, and time. She appears well-developed and well-nourished. No distress.  HENT:  Head: Normocephalic and atraumatic.  Right Ear: External ear normal.  Left Ear: External ear normal.  Nose: Nose normal.  Mouth/Throat: Oropharynx is clear and moist.  Eyes: Conjunctivae and EOM are normal. Pupils are equal, round, and reactive to light. Right eye exhibits no discharge. Left eye exhibits no discharge. No scleral icterus.  Neck: Normal range of motion. Neck supple.  Cardiovascular: Normal rate and regular rhythm.   Pulmonary/Chest: Effort normal and breath sounds normal. No respiratory distress.  Musculoskeletal: She exhibits edema and tenderness.  Diffuse TTP of right distal tib/fib, ankle, and foot with mild edema and decreased range of motion due to pain. TTP of left medial ankle. Full range of motion of left lower extremity. Strength and sensation intact. Distal pulses intact. Compartments soft.  Neurological: She is alert and oriented to person, place, and time. No sensory deficit.  Skin: Skin is warm and dry. She is not diaphoretic.  Psychiatric: She has a normal mood and affect. Her behavior is normal.  Nursing note and vitals reviewed.   ED Course  Procedures (including critical care time)  Labs Review Labs Reviewed - No data to display  Imaging Review Dg Tibia/fibula  Right  04/02/2015  CLINICAL DATA:  Right lower extremity pain after fall EXAM: RIGHT TIBIA AND FIBULA - 2 VIEW COMPARISON:  None. FINDINGS: No fracture is seen within the right tibia or right fibula on these views, which exclude portions of the distal right tibia and distal right fibula. No malalignment is seen at the right knee. No aggressive-appearing focal osseous lesions. Soft tissues are unremarkable. IMPRESSION: Negative. Electronically Signed   By: Delbert PhenixJason A Poff M.D.   On:  04/02/2015 12:48   Dg Ankle Complete Left  04/02/2015  CLINICAL DATA:  Left ankle pain EXAM: LEFT ANKLE COMPLETE - 3+ VIEW COMPARISON:  None. FINDINGS: Three views of the left ankle submitted. No acute fracture or subluxation. Ankle mortise is preserved. IMPRESSION: Negative. Electronically Signed   By: Natasha MeadLiviu  Pop M.D.   On: 04/02/2015 12:45   Dg Ankle Complete Right  04/02/2015  CLINICAL DATA:  Fall.  Right ankle pain. EXAM: RIGHT ANKLE - COMPLETE 3+ VIEW COMPARISON:  None. FINDINGS: There is prominent soft tissue swelling in the lateral right ankle. No fracture or malalignment. No aggressive-appearing focal osseous lesions. No appreciable degenerative or erosive arthropathy. IMPRESSION: Prominent lateral right ankle soft tissue swelling, with no fracture or malalignment in the right ankle. Electronically Signed   By: Delbert PhenixJason A Poff M.D.   On: 04/02/2015 12:45   Dg Foot Complete Right  04/02/2015  CLINICAL DATA:  39 year old female with bilateral lower extremity pain after falling off the porch earlier this morning. EXAM: RIGHT FOOT COMPLETE - 3+ VIEW COMPARISON:  Concurrently obtained radiographs of the right ankle and lower leg; concurrently obtained radiographs of the contralateral ankle FINDINGS: There is no evidence of fracture or dislocation. There is no evidence of arthropathy or other focal bone abnormality. Soft tissues are unremarkable. IMPRESSION: Negative. Electronically Signed   By: Malachy MoanHeath  McCullough M.D.   On: 04/02/2015 12:46   I have personally reviewed and evaluated these images as part of my medical decision-making.   EKG Interpretation None      MDM   Final diagnoses:  Bilateral ankle pain    39 year old female presents with bilateral ankle pain after twisting her left ankle and landing on her right distal lower extremity. She reports numbness to the posterior aspect of her right leg. She denies paresthesia, weakness, additional injury. Patient is afebrile. Vital signs  stable. Diffuse TTP of right distal tib/fib, ankle, and foot with mild edema and decreased range of motion due to pain. TTP of left medial ankle. Full range of motion of left lower extremity. Strength and sensation intact. Distal pulses intact. Compartments soft.   Patient given tylenol and ice for pain. Imaging of right foot remarkable for prominent lateral right ankle soft tissue swelling, no fracture or malalignment. Imaging of left ankle negative for fracture or subluxation.  Patient given ankle brace. Advised to rest, ice, elevate. Will discharge with ibuprofen. Patient is well-appearing, feel she is stable for discharge at this time. Patient to follow-up with PCP. Return precautions discussed.  BP 138/83 mmHg  Pulse 78  Temp(Src) 99 F (37.2 C) (Oral)  Resp 18  Ht 5\' 2"  (1.575 m)  Wt 174 lb (78.926 kg)  BMI 31.82 kg/m2  SpO2 99%  LMP 03/21/2015   Mady GemmaElizabeth C Westfall, PA-C 04/02/15 1635  Arby BarretteMarcy Pfeiffer, MD 04/03/15 [phone]

## 2015-04-02 NOTE — ED Notes (Signed)
Patient transported to X-ray 

## 2015-06-26 ENCOUNTER — Emergency Department (HOSPITAL_BASED_OUTPATIENT_CLINIC_OR_DEPARTMENT_OTHER)
Admission: EM | Admit: 2015-06-26 | Discharge: 2015-06-26 | Disposition: A | Discharge status: Home / Self Care | Payer: Medicaid Other | Attending: Emergency Medicine | Admitting: Emergency Medicine

## 2015-06-26 ENCOUNTER — Encounter (HOSPITAL_BASED_OUTPATIENT_CLINIC_OR_DEPARTMENT_OTHER): Payer: Self-pay

## 2015-06-26 DIAGNOSIS — R1030 Lower abdominal pain, unspecified: Secondary | ICD-10-CM | POA: Diagnosis present

## 2015-06-26 DIAGNOSIS — Z3202 Encounter for pregnancy test, result negative: Secondary | ICD-10-CM | POA: Diagnosis not present

## 2015-06-26 DIAGNOSIS — R110 Nausea: Secondary | ICD-10-CM

## 2015-06-26 DIAGNOSIS — I10 Essential (primary) hypertension: Secondary | ICD-10-CM | POA: Diagnosis not present

## 2015-06-26 DIAGNOSIS — Z9889 Other specified postprocedural states: Secondary | ICD-10-CM | POA: Insufficient documentation

## 2015-06-26 DIAGNOSIS — R112 Nausea with vomiting, unspecified: Secondary | ICD-10-CM | POA: Diagnosis not present

## 2015-06-26 LAB — URINE MICROSCOPIC-ADD ON: WBC UA: NONE SEEN WBC/hpf (ref 0–5)

## 2015-06-26 LAB — URINALYSIS, ROUTINE W REFLEX MICROSCOPIC
Bilirubin Urine: NEGATIVE
GLUCOSE, UA: NEGATIVE mg/dL
KETONES UR: NEGATIVE mg/dL
LEUKOCYTES UA: NEGATIVE
NITRITE: NEGATIVE
PROTEIN: NEGATIVE mg/dL
Specific Gravity, Urine: 1.014 (ref 1.005–1.030)
pH: 7.5 (ref 5.0–8.0)

## 2015-06-26 LAB — PREGNANCY, URINE: PREG TEST UR: NEGATIVE

## 2015-06-26 MED ORDER — ONDANSETRON 4 MG PO TBDP: 4.0000 mg | ORAL_TABLET | Freq: Three times a day (TID) | ORAL | Status: DC | PRN

## 2015-06-26 NOTE — ED Notes (Signed)
MD at bedside. 

## 2015-06-26 NOTE — ED Provider Notes (Signed)
CSN: [id number]     Arrival date & time 06/26/15  2018 History  By signing my name below, I, Marisue Humble, attest that this documentation has been prepared under the direction and in the presence of Rolland Porter, MD . Electronically Signed: Marisue Humble, Scribe. 06/26/2015. 9:52 PM.     Chief Complaint  Patient presents with  . Abdominal Pain   The history is provided by the patient. No language interpreter was used.   HPI Comments:  Jennifer Mcgrath is a 40 y.o. female with a PMHx of HTN who presents to the Emergency Department complaining of intermittent nausea, and vomiting since last night. Pt reports associated lower abdominal cramping. Pt had concern for pregnancy today after taking a home pregnancy test last night and reading a positive this morning. She notes her LNMP began three weeks ago. No alleviating factors noted. She denies dysuria, diarrhea, and vaginal bleeding currently.  Past Medical History  Diagnosis Date  . Hypertension    Past Surgical History  Procedure Laterality Date  . Cesarean section  1992, 1999, 2003, 2008   No family history on file. Social History  Substance Use Topics  . Smoking status: Never Smoker   . Smokeless tobacco: None  . Alcohol Use: Yes     Comment: occasional    OB History    No data available     Review of Systems  Constitutional: Negative for diaphoresis and appetite change.  HENT: Negative for mouth sores and trouble swallowing.   Eyes: Negative for visual disturbance.  Respiratory: Negative for chest tightness and wheezing.   Gastrointestinal: Positive for nausea, vomiting and abdominal pain. Negative for abdominal distention.  Endocrine: Negative for polydipsia, polyphagia and polyuria.  Genitourinary: Negative for frequency.  Musculoskeletal: Negative for gait problem.  Skin: Negative for color change, pallor and rash.  Neurological: Negative for dizziness, syncope, light-headedness and headaches.  Hematological: Does not  bruise/bleed easily.  Psychiatric/Behavioral: Negative for behavioral problems and confusion.   Allergies  Review of patient's allergies indicates no known allergies.  Home Medications   Prior to Admission medications   Medication Sig Start Date End Date Taking? Authorizing Provider  Amlodipine Besylate-Valsartan (EXFORGE PO) Take by mouth.   Yes Historical Provider, MD  Aspirin-Acetaminophen-Caffeine (GOODY HEADACHE PO) Take 1 packet by mouth daily as needed.   Yes Historical Provider, MD  Linaclotide Karlene Einstein) 145 MCG CAPS capsule Take 145 mcg by mouth daily.   Yes Historical Provider, MD  Nebivolol HCl (BYSTOLIC PO) Take by mouth.   Yes Historical Provider, MD  ibuprofen (ADVIL,MOTRIN) 800 MG tablet Take 1 tablet (800 mg total) by mouth 3 (three) times daily. 04/02/15   Mady Gemma, PA-C  ondansetron (ZOFRAN ODT) 4 MG disintegrating tablet Take 1 tablet (4 mg total) by mouth every 8 (eight) hours as needed for nausea. 06/26/15   Rolland Porter, MD   BP 144/91 mmHg  Pulse 60  Temp(Src) 98.9 F (37.2 C) (Oral)  Resp 18  Ht  (1.575 m)  Wt 172 lb (78.019 kg)  BMI 31.45 kg/m2  SpO2 100%  LMP 06/10/2015   Physical Exam  Constitutional: She is oriented to person, place, and time. She appears well-developed and well-nourished. No distress.  HENT:  Head: Normocephalic.  Eyes: Conjunctivae are normal. Pupils are equal, round, and reactive to light. No scleral icterus.  Neck: Normal range of motion. Neck supple. No thyromegaly present.  Cardiovascular: Normal rate and regular rhythm.  Exam reveals no gallop and no friction rub.  No murmur heard. Pulmonary/Chest: Effort normal and breath sounds normal. No respiratory distress. She has no wheezes. She has no rales.  Abdominal: Soft. Bowel sounds are normal. She exhibits no distension. There is no tenderness. There is no rebound.  Minimal mid abdominal tenderness  Musculoskeletal: Normal range of motion.  Neurological: She is  alert and oriented to person, place, and time.  Skin: Skin is warm and dry. No rash noted.  Psychiatric: She has a normal mood and affect. Her behavior is normal.    ED Course  Procedures  DIAGNOSTIC STUDIES: Oxygen Saturation is 100% on RA, normal by my interpretation.    COORDINATION OF CARE: 12:46 AM Will prescribe nausea medication. Discussed treatment plan with pt at bedside and pt agreed to plan.  Labs Review Labs Reviewed  URINALYSIS, ROUTINE W REFLEX MICROSCOPIC (NOT AT [facility]) - Abnormal; Notable for the following:    Hgb urine dipstick LARGE (*)    All other components within normal limits  URINE MICROSCOPIC-ADD ON - Abnormal; Notable for the following:    Squamous Epithelial / LPF 0-5 (*)    Bacteria, UA FEW (*)    All other components within normal limits  PREGNANCY, URINE    Imaging Review No results found. I have personally reviewed and evaluated these lab results as part of my medical decision-making.   EKG Interpretation None      MDM   Final diagnoses:  Nausea    I personally performed the services described in this documentation, which was scribed in my presence. The recorded information has been reviewed and is accurate.    Rolland Porter, MD 07/04/15 [phone]

## 2015-06-26 NOTE — Discharge Instructions (Signed)
Nausea and Vomiting  Nausea means you feel sick to your stomach. Throwing up (vomiting) is a reflex where stomach contents come out of your mouth.  HOME CARE   · Take medicine as told by your doctor.  · Do not force yourself to eat. However, you do need to drink fluids.  · If you feel like eating, eat a normal diet as told by your doctor.    Eat rice, wheat, potatoes, bread, lean meats, yogurt, fruits, and vegetables.    Avoid high-fat foods.  · Drink enough fluids to keep your pee (urine) clear or pale yellow.  · Ask your doctor how to replace body fluid losses (rehydrate). Signs of body fluid loss (dehydration) include:    Feeling very thirsty.    Dry lips and mouth.    Feeling dizzy.    Dark pee.    Peeing less than normal.    Feeling confused.    Fast breathing or heart rate.  GET HELP RIGHT AWAY IF:   · You have blood in your throw up.  · You have black or bloody poop (stool).  · You have a bad headache or stiff neck.  · You feel confused.  · You have bad belly (abdominal) pain.  · You have chest pain or trouble breathing.  · You do not pee at least once every 8 hours.  · You have cold, clammy skin.  · You keep throwing up after 24 to 48 hours.  · You have a fever.  MAKE SURE YOU:   · Understand these instructions.  · Will watch your condition.  · Will get help right away if you are not doing well or get worse.     This information is not intended to replace advice given to you by your health care provider. Make sure you discuss any questions you have with your health care provider.     Document Released: 11/05/2007 Document Revised: 08/11/2011 Document Reviewed: 10/18/2010  Elsevier Interactive Patient Education ©2016 Elsevier Inc.

## 2015-06-26 NOTE — ED Notes (Addendum)
Lower middle abdominal pain with nausea and three episodes of vomiting today, no dysuria, no vaginal discharge, no fevers, pt had a positive pregnancy test yesterday and a negative test today.  Pt states she had a full normal period in January, but that she has a hx of having regular periods through previous pregnancies.

## 2015-09-06 ENCOUNTER — Emergency Department (HOSPITAL_BASED_OUTPATIENT_CLINIC_OR_DEPARTMENT_OTHER)
Admission: EM | Admit: 2015-09-06 | Discharge: 2015-09-06 | Disposition: A | Discharge status: Home / Self Care | Payer: Medicaid Other | Attending: Emergency Medicine | Admitting: Emergency Medicine

## 2015-09-06 ENCOUNTER — Encounter (HOSPITAL_BASED_OUTPATIENT_CLINIC_OR_DEPARTMENT_OTHER): Payer: Self-pay | Admitting: *Deleted

## 2015-09-06 DIAGNOSIS — Z79899 Other long term (current) drug therapy: Secondary | ICD-10-CM | POA: Insufficient documentation

## 2015-09-06 DIAGNOSIS — N390 Urinary tract infection, site not specified: Secondary | ICD-10-CM | POA: Diagnosis not present

## 2015-09-06 DIAGNOSIS — Z7982 Long term (current) use of aspirin: Secondary | ICD-10-CM | POA: Insufficient documentation

## 2015-09-06 DIAGNOSIS — I10 Essential (primary) hypertension: Secondary | ICD-10-CM | POA: Insufficient documentation

## 2015-09-06 DIAGNOSIS — R1031 Right lower quadrant pain: Secondary | ICD-10-CM | POA: Diagnosis present

## 2015-09-06 DIAGNOSIS — R319 Hematuria, unspecified: Secondary | ICD-10-CM

## 2015-09-06 LAB — BASIC METABOLIC PANEL WITH GFR
Anion gap: 8 (ref 5–15)
BUN: 11 mg/dL (ref 6–20)
CO2: 27 mmol/L (ref 22–32)
Calcium: 8.4 mg/dL — ABNORMAL LOW (ref 8.9–10.3)
Chloride: 103 mmol/L (ref 101–111)
Creatinine, Ser: 0.76 mg/dL (ref 0.44–1.00)
GFR calc Af Amer: >60 mL/min (ref >60)
GFR calc non Af Amer: >60 mL/min (ref >60)
Glucose, Bld: 98 mg/dL (ref 65–99)
Potassium: 3.1 mmol/L — ABNORMAL LOW (ref 3.5–5.1)
Sodium: 138 mmol/L (ref 135–145)

## 2015-09-06 LAB — URINE MICROSCOPIC-ADD ON

## 2015-09-06 LAB — URINALYSIS, ROUTINE W REFLEX MICROSCOPIC
Bilirubin Urine: NEGATIVE
Glucose, UA: NEGATIVE mg/dL
Ketones, ur: NEGATIVE mg/dL
Nitrite: NEGATIVE
PH: 6 (ref 5.0–8.0)
Protein, ur: NEGATIVE mg/dL
Specific Gravity, Urine: 1.024 (ref 1.005–1.030)

## 2015-09-06 LAB — MAGNESIUM: Magnesium: 1.9 mg/dL (ref 1.7–2.4)

## 2015-09-06 LAB — PREGNANCY, URINE: Preg Test, Ur: NEGATIVE

## 2015-09-06 MED ORDER — POTASSIUM CHLORIDE CRYS ER 20 MEQ PO TBCR
40.0000 meq | EXTENDED_RELEASE_TABLET | Freq: Once | ORAL | Status: AC
  Administered 2015-09-06: 40 meq via ORAL
  Filled 2015-09-06: qty 2

## 2015-09-06 MED ORDER — SULFAMETHOXAZOLE-TRIMETHOPRIM 800-160 MG PO TABS: 1.0000 | ORAL_TABLET | Freq: Two times a day (BID) | ORAL | Status: AC

## 2015-09-06 MED FILL — SULFAMETHOXAZOLE-TMP DS TAB: 800-160 | 7 days supply | Qty: 14 | Fill #0

## 2015-09-06 NOTE — Discharge Instructions (Signed)
Urinary Tract Infection Urine has been sent for culture. We will call you if the antibiotic prescribed you need to be changed. Call your primary care physician to get your blood potassium level rechecked in a week. Your blood potassium was slightly low today at 3.1. Given potassium by mouth in the emergency department today A urinary tract infection (UTI) can occur any place along the urinary tract. The tract includes the kidneys, ureters, bladder, and urethra. A type of germ called bacteria often causes a UTI. UTIs are often helped with antibiotic medicine.  HOME CARE   If given, take antibiotics as told by your doctor. Finish them even if you start to feel better.  Drink enough fluids to keep your pee (urine) clear or pale yellow.  Avoid tea, drinks with caffeine, and bubbly (carbonated) drinks.  Pee often. Avoid holding your pee in for a long time.  Pee before and after having sex (intercourse).  Wipe from front to back after you poop (bowel movement) if you are a woman. Use each tissue only once. GET HELP RIGHT AWAY IF:   You have back pain.  You have lower belly (abdominal) pain.  You have chills.  You feel sick to your stomach (nauseous).  You throw up (vomit).  Your burning or discomfort with peeing does not go away.  You have a fever.  Your symptoms are not better in 3 days. MAKE SURE YOU:   Understand these instructions.  Will watch your condition.  Will get help right away if you are not doing well or get worse.   This information is not intended to replace advice given to you by your health care provider. Make sure you discuss any questions you have with your health care provider.   Document Released: 11/05/2007 Document Revised: 06/09/2014 Document Reviewed: 12/18/2011 Elsevier Interactive Patient Education [employer]2016 Elsevier Inc.

## 2015-09-06 NOTE — ED Notes (Signed)
States her urine has had a bad smell for the past 2 days. Denies dysuria. Lower abdominal pain and nausea.

## 2015-09-06 NOTE — ED Notes (Signed)
Patient c/o darker than normal urine with a foul smell x3-4 days. Patient states she has also had low back pain over her kidneys. Patient denies increase in urination or discomfort with voiding. Patient is A&Ox4, NAD noted. Friend at bedside.

## 2015-09-06 NOTE — ED Provider Notes (Signed)
CSN: [id number]     Arrival date & time 09/06/15  1210 History   First MD Initiated Contact with Patient 09/06/15 1236     Chief Complaint  Patient presents with  . Abdominal Pain     (Consider location/radiation/quality/duration/timing/severity/associated sxs/prior Treatment) HPI Complains of lower abdominal pain at suprapubic area rating to right flank onset 3 days ago. Pain is intermittent last 2 or 3 minutes at a time. No treatment prior to coming here. Presently asymptomatic. She reports symptoms feel like urinary tract infection she's had in the past. She denies vaginal discharge. Last bowel movement yesterday, normal. No other associated symptoms. Presently hungry. Denies nausea. Past Medical History  Diagnosis Date  . Hypertension    Past Surgical History  Procedure Laterality Date  . Cesarean section  1992, 1999, 2003, 2008   No family history on file. Social History  Substance Use Topics  . Smoking status: Never Smoker   . Smokeless tobacco: None  . Alcohol Use: Yes     Comment: occasional    OB History    No data available     Review of Systems  Constitutional: Negative.   HENT: Negative.   Respiratory: Negative.   Cardiovascular: Negative.   Gastrointestinal: Positive for abdominal pain.  Genitourinary: Positive for flank pain.  Musculoskeletal: Negative.   Skin: Negative.   Neurological: Negative.   Psychiatric/Behavioral: Negative.   All other systems reviewed and are negative.     Allergies  Review of patient's allergies indicates no known allergies.  Home Medications   Prior to Admission medications   Medication Sig Start Date End Date Taking? Authorizing Provider  Amlodipine Besylate-Valsartan (EXFORGE PO) Take by mouth.    Historical Provider, MD  Aspirin-Acetaminophen-Caffeine (GOODY HEADACHE PO) Take 1 packet by mouth daily as needed.    Historical Provider, MD  ibuprofen (ADVIL,MOTRIN) 800 MG tablet Take 1 tablet (800 mg total) by mouth 3  (three) times daily. 04/02/15   Mady Gemma, PA-C  Linaclotide (LINZESS) 145 MCG CAPS capsule Take 145 mcg by mouth daily.    Historical Provider, MD  Nebivolol HCl (BYSTOLIC PO) Take by mouth.    Historical Provider, MD  ondansetron (ZOFRAN ODT) 4 MG disintegrating tablet Take 1 tablet (4 mg total) by mouth every 8 (eight) hours as needed for nausea. 06/26/15   Rolland Porter, MD   BP 125/85 mmHg  Pulse 54  Temp(Src) 98.5 F (36.9 C) (Oral)  Resp 20  Ht  (1.575 m)  Wt 172 lb (78.019 kg)  BMI 31.45 kg/m2  SpO2 99%  LMP 09/01/2015 Physical Exam  Constitutional: She appears well-developed and well-nourished.  HENT:  Head: Normocephalic and atraumatic.  Eyes: Conjunctivae are normal. Pupils are equal, round, and reactive to light.  Neck: Neck supple. No tracheal deviation present. No thyromegaly present.  Cardiovascular: Normal rate and regular rhythm.   No murmur heard. Pulmonary/Chest: Effort normal and breath sounds normal.  Abdominal: Soft. Bowel sounds are normal. She exhibits no distension. There is no tenderness.  Genitourinary:  No flank tenderness  Musculoskeletal: Normal range of motion. She exhibits no edema or tenderness.  Neurological: She is alert. Coordination normal.  Skin: Skin is warm and dry. No rash noted.  Psychiatric: She has a normal mood and affect.  Nursing note and vitals reviewed.   ED Course  Procedures (including critical care time) Labs Review Labs Reviewed  PREGNANCY, URINE  URINALYSIS, ROUTINE W REFLEX MICROSCOPIC (NOT AT [facility])    Imaging Review No results found.  I have personally reviewed and evaluated these images and lab results as part of my medical decision-making.   EKG Interpretation None      Results for orders placed or performed during the hospital encounter of 09/06/15  Urinalysis, Routine w reflex microscopic (not [facility])  Result Value Ref Range   Color, Urine YELLOW YELLOW   APPearance CLOUDY (A) CLEAR    Specific Gravity, Urine 1.024 1.005 - 1.030   pH 6.0 5.0 - 8.0   Glucose, UA NEGATIVE NEGATIVE mg/dL   Hgb urine dipstick LARGE (A) NEGATIVE   Bilirubin Urine NEGATIVE NEGATIVE   Ketones, ur NEGATIVE NEGATIVE mg/dL   Protein, ur NEGATIVE NEGATIVE mg/dL   Nitrite NEGATIVE NEGATIVE   Leukocytes, UA MODERATE (A) NEGATIVE  Pregnancy, urine  Result Value Ref Range   Preg Test, Ur NEGATIVE NEGATIVE  Urine microscopic-add on  Result Value Ref Range   Squamous Epithelial / LPF 0-5 (A) NONE SEEN   WBC, UA 6-30 0 - 5 WBC/hpf   RBC / HPF 6-30 0 - 5 RBC/hpf   Bacteria, UA FEW (A) NONE SEEN   Urine-Other MUCOUS PRESENT   Basic metabolic panel  Result Value Ref Range   Sodium 138 135 - 145 mmol/L   Potassium 3.1 (L) 3.5 - 5.1 mmol/L   Chloride 103 101 - 111 mmol/L   CO2 27 22 - 32 mmol/L   Glucose, Bld 98 65 - 99 mg/dL   BUN 11 6 - 20 mg/dL   Creatinine, Ser 1.610.76 0.44 - 1.00 mg/dL   Calcium 8.4 (L) 8.9 - 10.3 mg/dL   GFR calc non Af Amer >60 >60 mL/min   GFR calc Af Amer >60 >60 mL/min   Anion gap 8 5 - 15  Magnesium  Result Value Ref Range   Magnesium 1.9 1.7 - 2.4 mg/dL   No results found.  3 PM remains asymptomatic MDM  We'll treat for urinary tract infection based on symptoms. Urine sent for culture. Prescription Bactrim DS. Received potassium chloride 40 mEq prior to discharge. Follow-up with her care physician to recheck serum potassium Final diagnoses:  None  Diagnosis #1 urinary tract infection #2 hypokalemia     Doug SouSam Shonya Sumida, MD 09/06/15 1502

## 2015-09-09 LAB — URINE CULTURE: SPECIAL REQUESTS: NORMAL

## 2015-09-10 ENCOUNTER — Telehealth (HOSPITAL_BASED_OUTPATIENT_CLINIC_OR_DEPARTMENT_OTHER): Payer: Self-pay | Admitting: Emergency Medicine

## 2015-09-10 NOTE — Telephone Encounter (Signed)
Post ED Visit - Positive Culture Follow-up  Culture report reviewed by antimicrobial stewardship pharmacist:  []  Enzo BiNathan Batchelder, Pharm.D. []  Celedonio MiyamotoJeremy Frens, Pharm.D., BCPS []  Garvin FilaMike Maccia, Pharm.D. []  Georgina PillionElizabeth Martin, Pharm.D., BCPS []  DeerwoodMinh Pham, [address]Ph[address]D., BCPS, AAHIVP []  Estella HuskMichelle Turner, Pharm.D., BCPS, AAHIVP []  Tennis Mustassie Stewart, Pharm.D. []  Sherle Poeob Vincent, [address]Ph[address]Soyla Dryer. Meagan Miles PharmD  Positive urine culture Treated with bactrim DS, organism sensitive to the same and no further patient follow-up is required at this time.  Berle MullMiller, Emmakate Hypes 09/10/2015, 9:42 AM

## 2016-06-11 ENCOUNTER — Emergency Department (HOSPITAL_BASED_OUTPATIENT_CLINIC_OR_DEPARTMENT_OTHER): Payer: Medicaid Other

## 2016-06-11 ENCOUNTER — Emergency Department (HOSPITAL_BASED_OUTPATIENT_CLINIC_OR_DEPARTMENT_OTHER)
Admission: EM | Admit: 2016-06-11 | Discharge: 2016-06-12 | Disposition: A | Discharge status: Home / Self Care | Payer: Medicaid Other | Attending: Emergency Medicine | Admitting: Emergency Medicine

## 2016-06-11 ENCOUNTER — Encounter (HOSPITAL_BASED_OUTPATIENT_CLINIC_OR_DEPARTMENT_OTHER): Payer: Self-pay

## 2016-06-11 DIAGNOSIS — E876 Hypokalemia: Secondary | ICD-10-CM | POA: Insufficient documentation

## 2016-06-11 DIAGNOSIS — R079 Chest pain, unspecified: Secondary | ICD-10-CM | POA: Insufficient documentation

## 2016-06-11 DIAGNOSIS — R072 Precordial pain: Secondary | ICD-10-CM | POA: Diagnosis present

## 2016-06-11 DIAGNOSIS — Z7982 Long term (current) use of aspirin: Secondary | ICD-10-CM | POA: Insufficient documentation

## 2016-06-11 DIAGNOSIS — I10 Essential (primary) hypertension: Secondary | ICD-10-CM | POA: Insufficient documentation

## 2016-06-11 LAB — CBC WITH DIFFERENTIAL/PLATELET
BASOS PCT: 0 %
Basophils Absolute: 0 10*3/uL (ref 0.0–0.1)
Eosinophils Absolute: 0.1 10*3/uL (ref 0.0–0.7)
Eosinophils Relative: 1 %
HEMATOCRIT: 34.1 % — AB (ref 36.0–46.0)
HEMOGLOBIN: 11 g/dL — AB (ref 12.0–15.0)
LYMPHS ABS: 3.2 10*3/uL (ref 0.7–4.0)
Lymphocytes Relative: 44 %
MCH: 29.1 pg (ref 26.0–34.0)
MCHC: 32.3 g/dL (ref 30.0–36.0)
MCV: 90.2 fL (ref 78.0–100.0)
MONOS PCT: 8 %
Monocytes Absolute: 0.5 10*3/uL (ref 0.1–1.0)
NEUTROS ABS: 3.4 10*3/uL (ref 1.7–7.7)
NEUTROS PCT: 47 %
Platelets: 317 10*3/uL (ref 150–400)
RBC: 3.78 MIL/uL — ABNORMAL LOW (ref 3.87–5.11)
RDW: 13 % (ref 11.5–15.5)
WBC: 7.2 10*3/uL (ref 4.0–10.5)

## 2016-06-11 LAB — URINALYSIS, ROUTINE W REFLEX MICROSCOPIC
Bilirubin Urine: NEGATIVE
Glucose, UA: NEGATIVE mg/dL
KETONES UR: NEGATIVE mg/dL
Leukocytes, UA: NEGATIVE
Nitrite: NEGATIVE
PROTEIN: NEGATIVE mg/dL
Specific Gravity, Urine: 1.002 — ABNORMAL LOW (ref 1.005–1.030)
pH: 6.5 (ref 5.0–8.0)

## 2016-06-11 LAB — TROPONIN I

## 2016-06-11 LAB — COMPREHENSIVE METABOLIC PANEL
ALK PHOS: 41 U/L (ref 38–126)
ALT: 12 U/L — ABNORMAL LOW (ref 14–54)
ANION GAP: 7 (ref 5–15)
AST: 21 U/L (ref 15–41)
Albumin: 3.5 g/dL (ref 3.5–5.0)
BILIRUBIN TOTAL: 0.4 mg/dL (ref 0.3–1.2)
BUN: 11 mg/dL (ref 6–20)
CALCIUM: 8.6 mg/dL — AB (ref 8.9–10.3)
CO2: 28 mmol/L (ref 22–32)
Chloride: 102 mmol/L (ref 101–111)
Creatinine, Ser: 0.84 mg/dL (ref 0.44–1.00)
Glucose, Bld: 97 mg/dL (ref 65–99)
POTASSIUM: 2.6 mmol/L — AB (ref 3.5–5.1)
Sodium: 137 mmol/L (ref 135–145)
TOTAL PROTEIN: 7.6 g/dL (ref 6.5–8.1)

## 2016-06-11 LAB — URINALYSIS, MICROSCOPIC (REFLEX)

## 2016-06-11 LAB — PREGNANCY, URINE: PREG TEST UR: NEGATIVE

## 2016-06-11 MED ORDER — KETOROLAC TROMETHAMINE 30 MG/ML IJ SOLN
30.0000 mg | Freq: Once | INTRAMUSCULAR | Status: AC
  Administered 2016-06-11: 30 mg via INTRAVENOUS
  Filled 2016-06-11: qty 1

## 2016-06-11 NOTE — ED Triage Notes (Signed)
C/o CP x 1 week-denies fever, cough-NAD-steady gait

## 2016-06-11 NOTE — ED Provider Notes (Signed)
By signing my name below, I, Doreatha Martin, attest that this documentation has been prepared under the direction and in the presence of Josiah Wojtaszek N Tiarna Koppen, DO. Electronically Signed: Doreatha Martin, ED Scribe. 06/12/16. 12:11 AM.    TIME SEEN: 11:04 PM   CHIEF COMPLAINT: Chief Complaint  Patient presents with  . Chest Pain     HPI:  HPI Comments: Jennifer Mcgrath is a 41 y.o. female with history of hypertension presents to the ED with 1 week of intermittent, "heavy", non-radiating substernal chest pain with associated mild cough. The patient denies any particular inciting or modifying factors associated with her chest pain. She states the pain is not exertional or pleuritic.  She denies any cardiac history, History of diabetes or hyperlipidemia, tobacco use, family history of cardiac death.  States Stress cyst was years ago because of her blood pressure being elevated and was negative. No history of catheterization. She has no other concerns at this time. Denies any nausea, diaphoresis, dizziness. Does feel that she can't catch her breath with this. Has complained of feeling chilled recently and also having sore throat.  No history of PE, DVT, exogenous estrogen use, recent fractures, surgery, trauma, hospitalization or prolonged travel. No lower extremity swelling or pain. No calf tenderness.   ROS: See HPI Constitutional: no fever  Eyes: no drainage  ENT: no runny nose   Cardiovascular:  + chest pain  Resp: no SOB. +cough GI: no vomiting GU: no dysuria Integumentary: no rash  Allergy: no hives  Musculoskeletal: no leg swelling  Neurological: no slurred speech ROS otherwise negative  PAST MEDICAL HISTORY/PAST SURGICAL HISTORY:  Past Medical History:  Diagnosis Date  . Hypertension     MEDICATIONS:  Prior to Admission medications   Medication Sig Start Date End Date Taking? Authorizing Provider  Amlodipine Besylate-Valsartan (EXFORGE PO) Take by mouth.    Historical Provider, MD   Aspirin-Acetaminophen-Caffeine (GOODY HEADACHE PO) Take 1 packet by mouth daily as needed.    Historical Provider, MD  Linaclotide Karlene Einstein) 145 MCG CAPS capsule Take 145 mcg by mouth daily.    Historical Provider, MD  Nebivolol HCl (BYSTOLIC PO) Take by mouth.    Historical Provider, MD    ALLERGIES:  No Known Allergies  SOCIAL HISTORY:  Social History  Substance Use Topics  . Smoking status: Never Smoker  . Smokeless tobacco: Never Used  . Alcohol use Yes     Comment: occasional     FAMILY HISTORY: No family history on file.  EXAM: BP 146/95   Pulse 68   Temp 98.1 F (36.7 C) (Oral)   Resp 22   Ht 5\' 2"  (1.575 m)   Wt 173 lb (78.5 kg)   LMP 05/08/2016   SpO2 100%   BMI 31.64 kg/m  CONSTITUTIONAL: Alert and oriented and responds appropriately to questions. Well-appearing; well-nourished HEAD: Normocephalic EYES: Conjunctivae clear, PERRL, EOMI ENT: normal nose; no rhinorrhea; moist mucous membranes; No pharyngeal erythema or petechiae, no tonsillar hypertrophy or exudate, no uvular deviation, no unilateral swelling, no trismus or drooling, no muffled voice, normal phonation, no stridor, no dental caries present, no drainable dental abscess noted, no Ludwig's angina, tongue sits flat in the bottom of the mouth, no angioedema, no facial erythema or warmth, no facial swelling; no pain with movement of the neck NECK: Supple, no meningismus, no nuchal rigidity, no LAD  CARD: RRR; S1 and S2 appreciated; ; no murmurs, no clicks, no rubs, no gallops CHEST: TTP over central chest. No crepitus, ecchymosis or  deformity. Palpation of this area does reproduce her pain. RESP: Normal chest excursion without splinting or tachypnea; breath sounds clear and equal bilaterally; no wheezes, no rhonchi, no rales, no hypoxia or respiratory distress, speaking full sentences ABD/GI: Normal bowel sounds; non-distended; soft, non-tender, no rebound, no guarding, no peritoneal signs, no  hepatosplenomegaly BACK:  The back appears normal and is non-tender to palpation, there is no CVA tenderness EXT: Normal ROM in all joints; non-tender to palpation; no edema; normal capillary refill; no cyanosis, no calf tenderness or swelling    SKIN: Normal color for age and race; warm; no rash NEURO: Moves all extremities equally, sensation to light touch intact diffusely, cranial nerves II through XII intact, normal speech PSYCH: The patient's mood and manner are appropriate. Grooming and personal hygiene are appropriate.  MEDICAL DECISION MAKING: Patient here with atypical chest pain. Her heart score is 1.  She is PERC negative. First troponin negative. Chest x-ray clear. Doubt PE, dissection. Doubt ACS. Plan is to repeat second troponin 3 hours. Will treat pain with Toradol as it may be musculoskeletal in nature given pain reproducible with palpation of her chest. She describes a viral upper respiratory symptoms as well. X-ray shows no pneumonia. I do not feel she needs antibiotics at this time.  Labs do show hypokalemia without EKG changes. We'll give oral potassium.  ED PROGRESS: 1:10 AM  Pt's chest pain has improved with Toradol. Still having some pain but drove herself to the emergency department. Will give dose of Tylenol for further pain management. Second troponin is pending.   1:25 AM  Pt's second troponin is negative. Again I feel this is atypical, likely musculoskeletal in nature. I do not think this is ACS. I feel she is safe for discharge home. We'll give outpatient PCP follow-up. Recommended alternating Tylenol and Motrin for pain. Discussed return precautions.   At this time, I do not feel there is any life-threatening condition present. I have reviewed and discussed all results (EKG, imaging, lab, urine as appropriate) and exam findings with patient/family. I have reviewed nursing notes and appropriate previous records.  I feel the patient is safe to be discharged home without  further emergent workup and can continue workup as an outpatient as needed. Discussed usual and customary return precautions. Patient/family verbalize understanding and are comfortable with this plan.  Outpatient follow-up has been provided. All questions have been answered.   EKG Interpretation  Date/Time:  Wednesday June 11 2016 20:59:55 EST Ventricular Rate:  54 PR Interval:  168 QRS Duration: 92 QT Interval:  484 QTC Calculation: 458 R Axis:   70 Text Interpretation:  Sinus bradycardia Cannot rule out Anterior infarct , age undetermined Abnormal ECG No significant change since last tracing Confirmed by Dawnelle Warman,  DO, Kaila Devries [phone]([id number]) on 06/11/2016 11:08:12 PM       I personally performed the services described in this documentation, which was scribed in my presence. The recorded information has been reviewed and is accurate.    Layla MawKristen N Marycarmen Hagey, DO 06/12/16 [phone]

## 2016-06-12 LAB — TROPONIN I: Troponin I: <0.03 ng/mL (ref <0.03)

## 2016-06-12 MED ORDER — POTASSIUM CHLORIDE CRYS ER 20 MEQ PO TBCR
40.0000 meq | EXTENDED_RELEASE_TABLET | Freq: Once | ORAL | Status: AC
  Administered 2016-06-12: 40 meq via ORAL
  Filled 2016-06-12: qty 2

## 2016-06-12 MED ORDER — ACETAMINOPHEN 500 MG PO TABS
1000.0000 mg | ORAL_TABLET | Freq: Once | ORAL | Status: AC
  Administered 2016-06-12: 1000 mg via ORAL
  Filled 2016-06-12: qty 2

## 2016-06-12 MED ORDER — POTASSIUM CHLORIDE CRYS ER 20 MEQ PO TBCR: 40.0000 meq | EXTENDED_RELEASE_TABLET | Freq: Every day | ORAL | 0 refills | Status: DC

## 2016-06-12 NOTE — Discharge Instructions (Addendum)
Your potassium level today was low at 2.6. You will need to follow-up with your primary care physician in one week to have your potassium rechecked.  You may alternate between Tylenol 1000 mg every 6 hours as needed for pain and ibuprofen 800 mg every 8 hours as needed for pain.   To find a primary care or specialty doctor please call [phone] or [phone] to access "[place] Find a Doctor Service."  You may also go on the [facility] website at [email or web address]  There are also multiple [facility], [facility], [facility] and [facility] practices throughout the [place] that are frequently accepting new patients. You may find a clinic that is close to your home and contact them.  [facility] -  [address] [place] [id number] [phone]   [facility] -  [address] [place] [id number] [phone]   [facility] [phone]  [place] [phone]

## 2016-08-21 ENCOUNTER — Encounter (HOSPITAL_BASED_OUTPATIENT_CLINIC_OR_DEPARTMENT_OTHER): Payer: Self-pay | Admitting: Emergency Medicine

## 2016-08-21 ENCOUNTER — Emergency Department (HOSPITAL_BASED_OUTPATIENT_CLINIC_OR_DEPARTMENT_OTHER)
Admission: EM | Admit: 2016-08-21 | Discharge: 2016-08-21 | Disposition: A | Discharge status: Home / Self Care | Payer: Medicaid Other | Attending: Emergency Medicine | Admitting: Emergency Medicine

## 2016-08-21 DIAGNOSIS — I10 Essential (primary) hypertension: Secondary | ICD-10-CM | POA: Diagnosis not present

## 2016-08-21 DIAGNOSIS — R072 Precordial pain: Secondary | ICD-10-CM | POA: Diagnosis not present

## 2016-08-21 DIAGNOSIS — K625 Hemorrhage of anus and rectum: Secondary | ICD-10-CM

## 2016-08-21 DIAGNOSIS — K640 First degree hemorrhoids: Secondary | ICD-10-CM | POA: Insufficient documentation

## 2016-08-21 DIAGNOSIS — R079 Chest pain, unspecified: Secondary | ICD-10-CM

## 2016-08-21 DIAGNOSIS — Z7982 Long term (current) use of aspirin: Secondary | ICD-10-CM | POA: Diagnosis not present

## 2016-08-21 LAB — OCCULT BLOOD X 1 CARD TO LAB, STOOL: Fecal Occult Bld: NEGATIVE

## 2016-08-21 MED ORDER — ACETAMINOPHEN 500 MG PO TABS
1000.0000 mg | ORAL_TABLET | Freq: Once | ORAL | Status: AC
  Administered 2016-08-21: 1000 mg via ORAL
  Filled 2016-08-21: qty 2

## 2016-08-21 MED ORDER — TRAMADOL HCL 50 MG PO TABS: 50.0000 mg | ORAL_TABLET | Freq: Four times a day (QID) | ORAL | 0 refills | Status: DC | PRN

## 2016-08-21 MED ORDER — PANTOPRAZOLE SODIUM 20 MG PO TBEC: 20.0000 mg | DELAYED_RELEASE_TABLET | Freq: Every day | ORAL | 0 refills | Status: DC

## 2016-08-21 MED ORDER — ACETAMINOPHEN 500 MG PO TABS: 1000.0000 mg | ORAL_TABLET | Freq: Four times a day (QID) | ORAL | 0 refills | Status: DC | PRN

## 2016-08-21 MED ORDER — DOCUSATE SODIUM 100 MG PO CAPS: 100.0000 mg | ORAL_CAPSULE | Freq: Two times a day (BID) | ORAL | 0 refills | Status: DC

## 2016-08-21 NOTE — Discharge Instructions (Signed)
You need to follow-up with your family doctor to continue diagnostic evaluation and routine health care. You should have a repeat mammogram if you are having any breast pain or concerns. Your last mammogram was in 2016. You also will need to discuss follow-up colonoscopy with your primary care provider. You have been seen for lower gastrointestinal bleeding. This should be followed up by colonoscopy which must be scheduled. Your chest pain symptoms may be due to reflux. Start Protonix as prescribed. If this does not relieve her symptoms, further diagnostic testing. Follow-up with your family doctor to determine if you're responding to treatment or not.

## 2016-08-21 NOTE — ED Triage Notes (Signed)
Patient states that she was constipated earlier today and took some medication to help her. The patient then reports that she started to have cramping and felt dizzy and lightheaded - patient went home and has had 2 BM's with blood in the tolient. The patient reports that she is still lightheaded and dizzy and having pain to her chest and back - feels weak all over

## 2016-08-21 NOTE — ED Provider Notes (Signed)
[facility] Provider Note   CSN: [id number] Arrival date & time: 08/21/16  1916   By signing my name below, I, Jennifer Mcgrath, attest that this documentation has been prepared under the direction and in the presence of Arby Barrette, MD. Electronically Signed: Talbert Mcgrath, Scribe. 08/21/16. 10:48 PM.    History   Chief Complaint Chief Complaint  Patient presents with  . Rectal Bleeding    HPI Jennifer Mcgrath is a 41 y.o. female with h/o HTN who presents to the Emergency Department complaining of have a singular episode of blood in her stool this afternoon that happened after she felt bloated and having chest pain. Pt states that she had no associated pain with her BM. Pt has associated sternal chest pain radiating to her back, SOB, blurring vision, headache, breast tenderness, dizziness.Patient reports she has had this discomfort in her chest for several years intermittently. She states it has been evaluated in the past and she's been told that his chest wall pain. She goes to primary care in [place] where they have done some cardiac testing and she was told that she had an irregular heart beat. Pt denies any h/o abdominal pain, rectal bleed, hemorrhoids. NKDA.  The history is provided by the patient. No language interpreter was used.    Past Medical History:  Diagnosis Date  . Hypertension     There are no active problems to display for this patient.   Past Surgical History:  Procedure Laterality Date  . CESAREAN SECTION  1992, 1999, 2003, 2008    OB History    No data available       Home Medications    Prior to Admission medications   Medication Sig Start Date End Date Taking? Authorizing Provider  acetaminophen (TYLENOL) 500 MG tablet Take 2 tablets (1,000 mg total) by mouth every 6 (six) hours as needed. 08/21/16   Arby Barrette, MD  Amlodipine Besylate-Valsartan (EXFORGE PO) Take by mouth.    Historical Provider, MD  Aspirin-Acetaminophen-Caffeine (GOODY  HEADACHE PO) Take 1 packet by mouth daily as needed.    Historical Provider, MD  docusate sodium (COLACE) 100 MG capsule Take 1 capsule (100 mg total) by mouth every 12 (twelve) hours. 08/21/16   Arby Barrette, MD  Linaclotide Karlene Einstein) 145 MCG CAPS capsule Take 145 mcg by mouth daily.    Historical Provider, MD  Nebivolol HCl (BYSTOLIC PO) Take by mouth.    Historical Provider, MD  pantoprazole (PROTONIX) 20 MG tablet Take 1 tablet (20 mg total) by mouth daily. 08/21/16   Arby Barrette, MD  potassium chloride SA (K-DUR,KLOR-CON) 20 MEQ tablet Take 2 tablets (40 mEq total) by mouth daily. 06/12/16   Kristen N Ward, DO  traMADol (ULTRAM) 50 MG tablet Take 1 tablet (50 mg total) by mouth every 6 (six) hours as needed. 08/21/16   Arby Barrette, MD    Family History History reviewed. No pertinent family history.  Social History Social History  Substance Use Topics  . Smoking status: Never Smoker  . Smokeless tobacco: Never Used  . Alcohol use Yes     Comment: occasional      Allergies   Patient has no known allergies.   Review of Systems Review of Systems  Gastrointestinal: Positive for hematochezia.  10 Systems reviewed and are negative for acute change except as noted in the HPI.    Physical Exam Updated Vital Signs BP 133/88 (BP Location: Right Arm)   Pulse 61   Temp 98.3 F (36.8 C) (  Oral)   Resp 16   Ht 5\' 2"  (1.575 m)   Wt 173 lb (78.5 kg)   LMP 07/17/2016   SpO2 99%   BMI 31.64 kg/m   Physical Exam  Constitutional: She is oriented to person, place, and time. She appears well-developed and well-nourished.  HENT:  Head: Normocephalic and atraumatic.  Cardiovascular: Normal rate, regular rhythm, normal heart sounds and intact distal pulses.  Exam reveals no gallop and no friction rub.   No murmur heard. Pulmonary/Chest: Effort normal. She has no wheezes. She has no rales. She exhibits tenderness.  Right sternal border reproducible chest wall pain to palpation.     Abdominal: Soft. She exhibits no distension and no mass. There is no tenderness.  Genitourinary:  Genitourinary Comments: Small non-thrombosed hemorrhoid on rectal exam. Non mass in the rectal vault. Blood tinged mucus no formed stool.  Musculoskeletal: She exhibits no edema or tenderness.  Neurological: She is alert and oriented to person, place, and time. No cranial nerve deficit. She exhibits normal muscle tone. Coordination normal.  Skin: Skin is warm and dry.  Psychiatric: She has a normal mood and affect.  Nursing note and vitals reviewed.    ED Treatments / Results   DIAGNOSTIC STUDIES: Oxygen Saturation is 100% on room air, normal by my interpretation.    COORDINATION OF CARE: 10:43 PM Discussed treatment plan with pt at bedside and pt agreed to plan, which includes rectal exam, follow up with a gastroenterologist and a colonoscopy.    Labs (all labs ordered are listed, but only abnormal results are displayed) Labs Reviewed  OCCULT BLOOD X 1 CARD TO LAB, STOOL    EKG  EKG Interpretation  Date/Time:  Thursday August 21 2016 19:29:46 EDT Ventricular Rate:  57 PR Interval:  174 QRS Duration: 86 QT Interval:  464 QTC Calculation: 451 R Axis:   74 Text Interpretation:  Sinus bradycardia Otherwise normal ECG normal, no change from old Confirmed by Donnald Garre, MD, Lebron Conners [phone]) on 08/21/2016 7:35:21 PM       Radiology No results found.  Procedures Procedures (including critical care time)  Medications Ordered in ED Medications  acetaminophen (TYLENOL) tablet 1,000 mg (1,000 mg Oral Given 08/21/16 2259)     Initial Impression / Assessment and Plan / ED Course  I have reviewed the triage vital signs and the nursing notes.  Pertinent labs & imaging results that were available during my care of the patient were reviewed by me and considered in my medical decision making (see chart for details).       Final Clinical Impressions(s) / ED Diagnoses   Final  diagnoses:  Rectal bleeding  Grade I hemorrhoids  Chest pain in adult  Patient has several complaints. First complaint that brought her to the hospital as rectal bleeding. She reports that she did not have pain but had some bloating and had a bowel movement and saw bright red blood. She denies prior history of rectal bleeding. She has not been experiencing abdominal pain. She does report that she has had chest pain that is been intermittent and constant for several years duration. It is on the right side of her sternum and goes to her back. Patient's bleeding appears to be hemorrhoidal. There is no mass in the vault. She does have pink tinged mucus in the rectal vault. She is otherwise well without abdominal pain. I will have her take Colace daily and discuss follow-up colonoscopy with her primary care doctor. Patient also describes chest pain that  is right sided and radiates into her back. She has had several ED visits and diagnostic evaluation for this. This does not sound cardiac ischemic in nature. I do not feel that repeat troponin, chest x-ray and labs are indicated at this time. Consideration is given to possible reflux symptomology. Patient is counseled to try 2 weeks of proton ex to see if that alleviates his symptoms. She also endorsed some breast soft tissue discomfort. Palpation does not reveal any masses or fullness. Patient is counseled to follow-up for repeat mammography her last mammogram being 2016. At this time, the patient is stable and clinically well in appearance. All of the necessary follow-up instructions have been reviewed. Patient is agreeable to this plan.  New Prescriptions New Prescriptions   ACETAMINOPHEN (TYLENOL) 500 MG TABLET    Take 2 tablets (1,000 mg total) by mouth every 6 (six) hours as needed.   DOCUSATE SODIUM (COLACE) 100 MG CAPSULE    Take 1 capsule (100 mg total) by mouth every 12 (twelve) hours.   PANTOPRAZOLE (PROTONIX) 20 MG TABLET    Take 1 tablet (20 mg  total) by mouth daily.   TRAMADOL (ULTRAM) 50 MG TABLET    Take 1 tablet (50 mg total) by mouth every 6 (six) hours as needed.       Arby BarretteMarcy Cheikh Bramble, MD 08/21/16 [phone]

## 2016-08-21 NOTE — ED Notes (Signed)
Pt reports she has had ongoing chest pains for the last several months with blurred vision. Pt reports that at approximately 1530-1600 she had a bowel movement and there it was bloody like her menstrual cycle.

## 2016-09-02 ENCOUNTER — Emergency Department (HOSPITAL_BASED_OUTPATIENT_CLINIC_OR_DEPARTMENT_OTHER): Payer: Medicaid Other

## 2016-09-02 ENCOUNTER — Emergency Department (HOSPITAL_BASED_OUTPATIENT_CLINIC_OR_DEPARTMENT_OTHER)
Admission: EM | Admit: 2016-09-02 | Discharge: 2016-09-02 | Disposition: A | Discharge status: Home / Self Care | Payer: Medicaid Other | Attending: Emergency Medicine | Admitting: Emergency Medicine

## 2016-09-02 ENCOUNTER — Encounter (HOSPITAL_BASED_OUTPATIENT_CLINIC_OR_DEPARTMENT_OTHER): Payer: Self-pay

## 2016-09-02 DIAGNOSIS — Y939 Activity, unspecified: Secondary | ICD-10-CM | POA: Diagnosis not present

## 2016-09-02 DIAGNOSIS — R0602 Shortness of breath: Secondary | ICD-10-CM | POA: Insufficient documentation

## 2016-09-02 DIAGNOSIS — S46812A Strain of other muscles, fascia and tendons at shoulder and upper arm level, left arm, initial encounter: Secondary | ICD-10-CM | POA: Diagnosis not present

## 2016-09-02 DIAGNOSIS — Z7982 Long term (current) use of aspirin: Secondary | ICD-10-CM | POA: Diagnosis not present

## 2016-09-02 DIAGNOSIS — I10 Essential (primary) hypertension: Secondary | ICD-10-CM | POA: Insufficient documentation

## 2016-09-02 DIAGNOSIS — Y999 Unspecified external cause status: Secondary | ICD-10-CM | POA: Diagnosis not present

## 2016-09-02 DIAGNOSIS — Z79899 Other long term (current) drug therapy: Secondary | ICD-10-CM | POA: Insufficient documentation

## 2016-09-02 DIAGNOSIS — Y929 Unspecified place or not applicable: Secondary | ICD-10-CM | POA: Diagnosis not present

## 2016-09-02 DIAGNOSIS — R0789 Other chest pain: Secondary | ICD-10-CM

## 2016-09-02 DIAGNOSIS — X58XXXA Exposure to other specified factors, initial encounter: Secondary | ICD-10-CM | POA: Diagnosis not present

## 2016-09-02 LAB — CBC WITH DIFFERENTIAL/PLATELET
Basophils Absolute: 0 10*3/uL (ref 0.0–0.1)
Basophils Relative: 0 %
EOS PCT: 2 %
Eosinophils Absolute: 0.1 10*3/uL (ref 0.0–0.7)
HCT: 30.3 % — ABNORMAL LOW (ref 36.0–46.0)
HEMOGLOBIN: 10 g/dL — AB (ref 12.0–15.0)
Lymphocytes Relative: 43 %
Lymphs Abs: 2.3 10*3/uL (ref 0.7–4.0)
MCH: 29.2 pg (ref 26.0–34.0)
MCHC: 33 g/dL (ref 30.0–36.0)
MCV: 88.6 fL (ref 78.0–100.0)
MONOS PCT: 9 %
Monocytes Absolute: 0.5 10*3/uL (ref 0.1–1.0)
NEUTROS ABS: 2.5 10*3/uL (ref 1.7–7.7)
NEUTROS PCT: 46 %
PLATELETS: 309 10*3/uL (ref 150–400)
RBC: 3.42 MIL/uL — AB (ref 3.87–5.11)
RDW: 13 % (ref 11.5–15.5)
WBC: 5.3 10*3/uL (ref 4.0–10.5)

## 2016-09-02 LAB — BASIC METABOLIC PANEL
Anion gap: 6 (ref 5–15)
BUN: 11 mg/dL (ref 6–20)
CALCIUM: 8.3 mg/dL — AB (ref 8.9–10.3)
CO2: 26 mmol/L (ref 22–32)
CREATININE: 0.73 mg/dL (ref 0.44–1.00)
Chloride: 106 mmol/L (ref 101–111)
GFR calc Af Amer: >60 mL/min (ref >60)
GLUCOSE: 101 mg/dL — AB (ref 65–99)
POTASSIUM: 2.7 mmol/L — AB (ref 3.5–5.1)
SODIUM: 138 mmol/L (ref 135–145)

## 2016-09-02 LAB — BRAIN NATRIURETIC PEPTIDE: B Natriuretic Peptide: 61.8 pg/mL (ref 0.0–100.0)

## 2016-09-02 LAB — TROPONIN I: Troponin I: <0.03 ng/mL (ref <0.03)

## 2016-09-02 MED ORDER — POTASSIUM CHLORIDE CRYS ER 20 MEQ PO TBCR: 40.0000 meq | EXTENDED_RELEASE_TABLET | Freq: Two times a day (BID) | ORAL | 0 refills | Status: DC

## 2016-09-02 MED ORDER — BUPIVACAINE HCL (PF) 0.5 % IJ SOLN
10.0000 mL | Freq: Once | INTRAMUSCULAR | Status: AC
  Administered 2016-09-02: 10 mL
  Filled 2016-09-02: qty 10

## 2016-09-02 MED ORDER — NAPROXEN 500 MG PO TABS: 500.0000 mg | ORAL_TABLET | Freq: Two times a day (BID) | ORAL | 0 refills | Status: AC

## 2016-09-02 MED ORDER — POTASSIUM CHLORIDE CRYS ER 20 MEQ PO TBCR
40.0000 meq | EXTENDED_RELEASE_TABLET | Freq: Once | ORAL | Status: AC
  Administered 2016-09-02: 40 meq via ORAL
  Filled 2016-09-02: qty 2

## 2016-09-02 MED ORDER — ASPIRIN 81 MG PO CHEW
324.0000 mg | CHEWABLE_TABLET | Freq: Once | ORAL | Status: AC
  Administered 2016-09-02: 324 mg via ORAL
  Filled 2016-09-02: qty 4

## 2016-09-02 NOTE — ED Notes (Signed)
Critical lab result of k+ 2.7 given to dr. Clydene Pugh and sophie, rn.

## 2016-09-02 NOTE — ED Provider Notes (Signed)
[facility] Provider Note   CSN: [id number] Arrival date & time: 09/02/16  1646     History   Chief Complaint Chief Complaint  Patient presents with  . Chest Pain    HPI Jennifer Mcgrath is a 41 y.o. female.  The history is provided by the patient.  Chest Pain   This is a new problem. The current episode started 6 to 12 hours ago (6 or 7 months of ongoing chest pressure and shortness of breath). The problem occurs constantly. The problem has not changed since onset.The pain is associated with rest. The pain is present in the lateral region. The pain is moderate. The quality of the pain is described as pressure-like and sharp (pins and needles). Radiates to: tingling in left arm and left neck tingling, soreness in left posterior shoulder. Duration of episode(s) is 8 hours (constantly). Associated symptoms include shortness of breath (chronically over several months). Pertinent negatives include no cough, no diaphoresis, no fever, no lower extremity edema, no nausea and no vomiting. She has tried nothing for the symptoms. Risk factors: hypertension.  Her family medical history is significant for heart disease (paternal aunt with early onset CAD).    Past Medical History:  Diagnosis Date  . Hypertension     There are no active problems to display for this patient.   Past Surgical History:  Procedure Laterality Date  . CESAREAN SECTION  1992, 1999, 2003, 2008    OB History    No data available       Home Medications    Prior to Admission medications   Medication Sig Start Date End Date Taking? Authorizing Provider  propranolol (INDERAL) 20 MG tablet Take 20 mg by mouth 3 (three) times daily.   Yes Historical Provider, MD  sucralfate (CARAFATE) 1 GM/10ML suspension Take 1 g by mouth 4 (four) times daily -  with meals and at bedtime.   Yes Historical Provider, MD  acetaminophen (TYLENOL) 500 MG tablet Take 2 tablets (1,000 mg total) by mouth every 6 (six) hours as  needed. 08/21/16   Arby Barrette, MD  Amlodipine Besylate-Valsartan (EXFORGE PO) Take by mouth.    Historical Provider, MD  Aspirin-Acetaminophen-Caffeine (GOODY HEADACHE PO) Take 1 packet by mouth daily as needed.    Historical Provider, MD  docusate sodium (COLACE) 100 MG capsule Take 1 capsule (100 mg total) by mouth every 12 (twelve) hours. 08/21/16   Arby Barrette, MD  Linaclotide Karlene Einstein) 145 MCG CAPS capsule Take 145 mcg by mouth daily.    Historical Provider, MD  pantoprazole (PROTONIX) 20 MG tablet Take 1 tablet (20 mg total) by mouth daily. 08/21/16   Arby Barrette, MD  potassium chloride SA (K-DUR,KLOR-CON) 20 MEQ tablet Take 2 tablets (40 mEq total) by mouth daily. 06/12/16   Kristen N Ward, DO  traMADol (ULTRAM) 50 MG tablet Take 1 tablet (50 mg total) by mouth every 6 (six) hours as needed. 08/21/16   Arby Barrette, MD    Family History No family history on file.  Social History Social History  Substance Use Topics  . Smoking status: Never Smoker  . Smokeless tobacco: Never Used  . Alcohol use Yes     Comment: occasional      Allergies   Patient has no known allergies.   Review of Systems Review of Systems  Constitutional: Negative for diaphoresis and fever.  Respiratory: Positive for shortness of breath (chronically over several months). Negative for cough.   Cardiovascular: Positive for chest pain.  Gastrointestinal: Negative for nausea and vomiting.  All other systems reviewed and are negative.    Physical Exam Updated Vital Signs BP (!) 160/108 (BP Location: Right Arm)   Pulse (!) 58   Temp 98.3 F (36.8 C) (Oral)   Resp 18   Ht  (1.575 m)   Wt 171 lb (77.6 kg)   LMP 08/25/2016   SpO2 98%   BMI 31.28 kg/m   Physical Exam  Constitutional: She is oriented to person, place, and time. She appears well-developed and well-nourished. No distress.  HENT:  Head: Normocephalic.  Nose: Nose normal.  Eyes: Conjunctivae are normal.  Neck: Neck  supple. No tracheal deviation present.  Cardiovascular: Normal rate, regular rhythm and normal heart sounds.   Pulmonary/Chest: Effort normal and breath sounds normal. No respiratory distress.  Abdominal: Soft. She exhibits no distension.  Musculoskeletal:       Left shoulder: She exhibits tenderness, pain and spasm.       Arms: Neurological: She is alert and oriented to person, place, and time.  Skin: Skin is warm and dry.  Psychiatric: She has a normal mood and affect.  Vitals reviewed.    ED Treatments / Results  Labs (all labs ordered are listed, but only abnormal results are displayed) Labs Reviewed  CBC WITH DIFFERENTIAL/PLATELET - Abnormal; Notable for the following:       Result Value   RBC 3.42 (*)    Hemoglobin 10.0 (*)    HCT 30.3 (*)    All other components within normal limits  BASIC METABOLIC PANEL - Abnormal; Notable for the following:    Potassium 2.7 (*)    Glucose, Bld 101 (*)    Calcium 8.3 (*)    All other components within normal limits  TROPONIN I  BRAIN NATRIURETIC PEPTIDE    EKG  EKG Interpretation  Date/Time:  Tuesday September 02 2016 16:53:49 EDT Ventricular Rate:  58 PR Interval:  184 QRS Duration: 82 QT Interval:  384 QTC Calculation: 376 R Axis:   66 Text Interpretation:  Sinus bradycardia Otherwise normal ECG No significant change since last tracing Confirmed by Kinjal Neitzke MD, Mileah Hemmer [phone]) on 09/02/2016 5:18:18 PM       Radiology Dg Chest 2 View  Result Date: 09/02/2016 CLINICAL DATA:  Chest pain EXAM: CHEST  2 VIEW COMPARISON:  06/11/2016 chest radiograph. FINDINGS: Stable cardiomediastinal silhouette with top-normal heart size. No pneumothorax. No pleural effusion. Lungs appear clear, with no acute consolidative airspace disease and no pulmonary edema. IMPRESSION: No active cardiopulmonary disease. Electronically Signed   By: Delbert Phenix M.D.   On: 09/02/2016 18:03    Procedures Procedures (including critical care time)  Procedure Note:  Trigger Point Injection for Myofascial pain  Performed by Dr. Clydene Pugh Indication: muscle/myofascial pain Muscle body and tendon sheath of the left superior trapezius muscle(s) were injected with 0.5% bupivacaine under sterile technique for release of muscle spasm/pain. Patient tolerated well with immediate improvement of symptoms and no immediate complications following procedure.  CPT Code:   1 or 2 muscle bodies: 20552  Medications Ordered in ED Medications  aspirin chewable tablet 324 mg (324 mg Oral Given 09/02/16 1755)  bupivacaine (MARCAINE) 0.5 % injection 10 mL (10 mLs Infiltration Given 09/02/16 1756)  potassium chloride SA (K-DUR,KLOR-CON) CR tablet 40 mEq (40 mEq Oral Given 09/02/16 1812)     Initial Impression / Assessment and Plan / ED Course  I have reviewed the triage vital signs and the nursing notes.  Pertinent labs &  imaging results that were available during my care of the patient were reviewed by me and considered in my medical decision making (see chart for details).     41 year old female presents with pain in her left upper chest which is described as sharp, tingling and has noted tingling in her left neck and left arm. She states that she has had a chronic chest pressure that has been with her daily for several months that is unchanged. Patient has been ongoing since she woke up this morning over 10 hours ago. She is well-appearing, symptoms appear to be reproducible and are localized to the left trapezius muscle which appears to be in spasm. 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 I suspect that many of her symptoms are related to this muscle spasm and discomfort. She was provided local anesthesia into the muscle which released it and greatly improved her discomfort as well as resolved her paresthesia. Patient was recommended to take short course of scheduled NSAIDs and engage in early mobility as definitive treatment. Workup is negative for ACS despite ongoing  symptoms with negative troponin, unremarkable EKG from prior and highly atypical symptoms. Plan to follow up with PCP as needed and return precautions discussed for worsening or new concerning symptoms.   Final Clinical Impressions(s) / ED Diagnoses   Final diagnoses:  Chest wall pain  Strain of left trapezius muscle, initial encounter    New Prescriptions New Prescriptions   NAPROXEN (NAPROSYN) 500 MG TABLET    Take 1 tablet (500 mg total) by mouth 2 (two) times daily with a meal.     Lyndal Pulley, MD 09/02/16 1843

## 2016-09-02 NOTE — ED Triage Notes (Signed)
c/o CP x today-NAD-presents to triage in w/c

## 2016-09-02 NOTE — ED Notes (Signed)
ED Provider at bedside. 

## 2016-09-02 NOTE — ED Notes (Signed)
Patient transported to X-ray 

## 2016-09-02 NOTE — ED Notes (Signed)
Patient denies pain and is resting comfortably.  

## 2016-09-02 NOTE — ED Notes (Addendum)
Chest pain x 6 months. Was eval in ED for same last Thursday and was seen x 2 by PMD for same. ? Cause . Today felt numbness and tingling to left arm, which is a new symptom . Also having SOB

## 2016-09-21 ENCOUNTER — Emergency Department (HOSPITAL_BASED_OUTPATIENT_CLINIC_OR_DEPARTMENT_OTHER): Payer: Medicaid Other

## 2016-09-21 ENCOUNTER — Emergency Department (HOSPITAL_BASED_OUTPATIENT_CLINIC_OR_DEPARTMENT_OTHER)
Admission: EM | Admit: 2016-09-21 | Discharge: 2016-09-21 | Disposition: A | Discharge status: Home / Self Care | Payer: Medicaid Other | Attending: Emergency Medicine | Admitting: Emergency Medicine

## 2016-09-21 ENCOUNTER — Encounter (HOSPITAL_BASED_OUTPATIENT_CLINIC_OR_DEPARTMENT_OTHER): Payer: Self-pay

## 2016-09-21 DIAGNOSIS — R0789 Other chest pain: Secondary | ICD-10-CM | POA: Insufficient documentation

## 2016-09-21 DIAGNOSIS — Z7982 Long term (current) use of aspirin: Secondary | ICD-10-CM | POA: Insufficient documentation

## 2016-09-21 DIAGNOSIS — Z79899 Other long term (current) drug therapy: Secondary | ICD-10-CM | POA: Insufficient documentation

## 2016-09-21 DIAGNOSIS — I10 Essential (primary) hypertension: Secondary | ICD-10-CM | POA: Diagnosis not present

## 2016-09-21 DIAGNOSIS — R600 Localized edema: Secondary | ICD-10-CM | POA: Diagnosis not present

## 2016-09-21 DIAGNOSIS — R079 Chest pain, unspecified: Secondary | ICD-10-CM

## 2016-09-21 DIAGNOSIS — M545 Low back pain, unspecified: Secondary | ICD-10-CM

## 2016-09-21 DIAGNOSIS — R001 Bradycardia, unspecified: Secondary | ICD-10-CM | POA: Diagnosis not present

## 2016-09-21 LAB — URINALYSIS, ROUTINE W REFLEX MICROSCOPIC

## 2016-09-21 LAB — URINALYSIS, MICROSCOPIC (REFLEX)

## 2016-09-21 LAB — CBC WITH DIFFERENTIAL/PLATELET
Basophils Absolute: 0 10*3/uL (ref 0.0–0.1)
Basophils Relative: 0 %
Eosinophils Absolute: 0.1 10*3/uL (ref 0.0–0.7)
Eosinophils Relative: 2 %
HCT: 33.3 % — ABNORMAL LOW (ref 36.0–46.0)
Hemoglobin: 10.8 g/dL — ABNORMAL LOW (ref 12.0–15.0)
Lymphocytes Relative: 48 %
Lymphs Abs: 2.5 10*3/uL (ref 0.7–4.0)
MCH: 28.1 pg (ref 26.0–34.0)
MCHC: 32.4 g/dL (ref 30.0–36.0)
MCV: 86.7 fL (ref 78.0–100.0)
Monocytes Absolute: 0.5 10*3/uL (ref 0.1–1.0)
Monocytes Relative: 10 %
Neutro Abs: 2.1 10*3/uL (ref 1.7–7.7)
Neutrophils Relative %: 40 %
Platelets: 327 10*3/uL (ref 150–400)
RBC: 3.84 MIL/uL — ABNORMAL LOW (ref 3.87–5.11)
RDW: 13.3 % (ref 11.5–15.5)
WBC: 5.2 10*3/uL (ref 4.0–10.5)

## 2016-09-21 LAB — TROPONIN I
Troponin I: <0.03 ng/mL (ref <0.03)
Troponin I: <0.03 ng/mL (ref <0.03)

## 2016-09-21 LAB — BASIC METABOLIC PANEL
Anion gap: 7 (ref 5–15)
BUN: 10 mg/dL (ref 6–20)
CO2: 25 mmol/L (ref 22–32)
Calcium: 8.4 mg/dL — ABNORMAL LOW (ref 8.9–10.3)
Chloride: 105 mmol/L (ref 101–111)
Creatinine, Ser: 0.91 mg/dL (ref 0.44–1.00)
GFR calc Af Amer: >60 mL/min (ref >60)
GFR calc non Af Amer: >60 mL/min (ref >60)
Glucose, Bld: 90 mg/dL (ref 65–99)
Potassium: 3.5 mmol/L (ref 3.5–5.1)
Sodium: 137 mmol/L (ref 135–145)

## 2016-09-21 LAB — PREGNANCY, URINE: Preg Test, Ur: NEGATIVE

## 2016-09-21 LAB — D-DIMER, QUANTITATIVE: D-Dimer, Quant: 0.73 ug/mL-FEU — ABNORMAL HIGH (ref 0.00–0.50)

## 2016-09-21 MED ORDER — IOPAMIDOL (ISOVUE-370) INJECTION 76%
100.0000 mL | Freq: Once | INTRAVENOUS | Status: AC | PRN
  Administered 2016-09-21: 100 mL via INTRAVENOUS

## 2016-09-21 MED ORDER — KETOROLAC TROMETHAMINE 30 MG/ML IJ SOLN
30.0000 mg | Freq: Once | INTRAMUSCULAR | Status: AC
  Administered 2016-09-21: 30 mg via INTRAVENOUS
  Filled 2016-09-21: qty 1

## 2016-09-21 NOTE — ED Triage Notes (Signed)
Pt reports intermittent chest pain that started yesterday. Central to right sided. Radiates to back. Denies shortness of breath and nausea.

## 2016-09-21 NOTE — ED Notes (Signed)
Unsuccessful IV attempt in the right and left AC x 2.  Site clean dry and intact.  Dressing applied.

## 2016-09-21 NOTE — ED Provider Notes (Signed)
[facility]   CSN: [id number] Arrival date & time: 09/21/16  1247     History   Chief Complaint Chief Complaint  Patient presents with  . Chest Pain    HPI Jennifer Mcgrath is a 41 y.o. female with history of hypertension who presents with a several month history of intermittent chest pain. Patient states she was evaluated a few weeks ago and diagnosed with musculoskeletal pain when she had associated left shoulder and upper trapezius pain. Patient's chest pain and pressure returned over the past few days. She describes a constant central pressure with intermittent sharp pain. Her pain is worse on inspiration. Patient has also had associated low back pain bilaterally. Patient denies any associated shortness of breath or diaphoresis. Patient also denies any abdominal pain, nausea, vomiting. Patient does note she said some darker urine lately, but no other urinary symptoms. Patient is currently on her menstrual cycle. Patient denies any recent long trips, surgeries, cancer, exogenous estrogen use. Patient does note some mild left ankle pain and swelling that she has noticed over the past 1-2 weeks. Patient does note that she has been under a lot of stress lately. She believes she may have been under a lot of stress the last time she had chest pain that was more severe as well.  HPI  Past Medical History:  Diagnosis Date  . Hypertension     There are no active problems to display for this patient.   Past Surgical History:  Procedure Laterality Date  . CESAREAN SECTION  1992, 1999, 2003, 2008    OB History    No data available       Home Medications    Prior to Admission medications   Medication Sig Start Date End Date Taking? Authorizing Provider  acetaminophen (TYLENOL) 500 MG tablet Take 2 tablets (1,000 mg total) by mouth every 6 (six) hours as needed. 08/21/16   Arby Barrette, MD  Amlodipine Besylate-Valsartan (EXFORGE PO) Take by mouth.     Historical Provider, MD  Aspirin-Acetaminophen-Caffeine (GOODY HEADACHE PO) Take 1 packet by mouth daily as needed.    Historical Provider, MD  docusate sodium (COLACE) 100 MG capsule Take 1 capsule (100 mg total) by mouth every 12 (twelve) hours. 08/21/16   Arby Barrette, MD  Linaclotide Karlene Einstein) 145 MCG CAPS capsule Take 145 mcg by mouth daily.    Historical Provider, MD  pantoprazole (PROTONIX) 20 MG tablet Take 1 tablet (20 mg total) by mouth daily. 08/21/16   Arby Barrette, MD  potassium chloride SA (K-DUR,KLOR-CON) 20 MEQ tablet Take 2 tablets (40 mEq total) by mouth 2 (two) times daily. 09/02/16   Lyndal Pulley, MD  propranolol (INDERAL) 20 MG tablet Take 20 mg by mouth 3 (three) times daily.    Historical Provider, MD  sucralfate (CARAFATE) 1 GM/10ML suspension Take 1 g by mouth 4 (four) times daily -  with meals and at bedtime.    Historical Provider, MD  traMADol (ULTRAM) 50 MG tablet Take 1 tablet (50 mg total) by mouth every 6 (six) hours as needed. 08/21/16   Arby Barrette, MD    Family History No family history on file.  Social History Social History  Substance Use Topics  . Smoking status: Never Smoker  . Smokeless tobacco: Never Used  . Alcohol use Yes     Comment: occasional      Allergies   Patient has no known allergies.   Review of Systems Review of Systems  Constitutional: Negative for  chills and fever.  HENT: Negative for facial swelling and sore throat.   Respiratory: Negative for shortness of breath.   Cardiovascular: Positive for chest pain.  Gastrointestinal: Negative for abdominal pain, nausea and vomiting.  Genitourinary: Negative for dysuria.  Musculoskeletal: Positive for back pain.  Skin: Negative for rash and wound.  Neurological: Negative for headaches.  Psychiatric/Behavioral: The patient is not nervous/anxious.      Physical Exam Updated Vital Signs BP (!) 158/101 (BP Location: Left Arm)   Pulse (!) 51   Temp 98.6 F (37 C) (Oral)    Resp (!) 22   Ht  (1.575 m)   Wt 77.6 kg   LMP 08/22/2016   SpO2 100%   BMI 31.28 kg/m   Physical Exam  Constitutional: She appears well-developed and well-nourished. No distress.  HENT:  Head: Normocephalic and atraumatic.  Mouth/Throat: Oropharynx is clear and moist. No oropharyngeal exudate.  Eyes: Conjunctivae are normal. Pupils are equal, round, and reactive to light. Right eye exhibits no discharge. Left eye exhibits no discharge. No scleral icterus.  Neck: Normal range of motion. Neck supple. No thyromegaly present.  Cardiovascular: Regular rhythm, normal heart sounds and intact distal pulses.  Bradycardia present.  Exam reveals no gallop and no friction rub.   No murmur heard. Pulmonary/Chest: Effort normal and breath sounds normal. No stridor. No respiratory distress. She has no wheezes. She has no rales. She exhibits tenderness.    Patient reporting chest pain on lung auscultation  Abdominal: Soft. Bowel sounds are normal. She exhibits no distension. There is no tenderness. There is no rebound and no guarding.  Musculoskeletal: She exhibits no edema.       Lumbar back: She exhibits tenderness. She exhibits no bony tenderness.       Back:  L ankle: Mild edema around the ankle joint, no warmth or erythema, mild tenderness No calf tenderness to palpation or color change.  Lymphadenopathy:    She has no cervical adenopathy.  Neurological: She is alert. Coordination normal.  Skin: Skin is warm and dry. No rash noted. She is not diaphoretic. No pallor.  Psychiatric: She has a normal mood and affect.  Nursing note and vitals reviewed.    ED Treatments / Results  Labs (all labs ordered are listed, but only abnormal results are displayed) Labs Reviewed  D-DIMER, QUANTITATIVE (NOT AT [facility]) - Abnormal; Notable for the following:       Result Value   D-Dimer, Quant 0.73 (*)    All other components within normal limits  BASIC METABOLIC PANEL - Abnormal; Notable for the  following:    Calcium 8.4 (*)    All other components within normal limits  CBC WITH DIFFERENTIAL/PLATELET - Abnormal; Notable for the following:    RBC 3.84 (*)    Hemoglobin 10.8 (*)    HCT 33.3 (*)    All other components within normal limits  URINALYSIS, ROUTINE W REFLEX MICROSCOPIC - Abnormal; Notable for the following:    Color, Urine RED (*)    APPearance TURBID (*)    Glucose, UA   (*)    Value: TEST NOT REPORTED DUE TO COLOR INTERFERENCE OF URINE PIGMENT   Hgb urine dipstick   (*)    Value: TEST NOT REPORTED DUE TO COLOR INTERFERENCE OF URINE PIGMENT   Bilirubin Urine   (*)    Value: TEST NOT REPORTED DUE TO COLOR INTERFERENCE OF URINE PIGMENT   Ketones, ur   (*)    Value: TEST NOT  REPORTED DUE TO COLOR INTERFERENCE OF URINE PIGMENT   Protein, ur   (*)    Value: TEST NOT REPORTED DUE TO COLOR INTERFERENCE OF URINE PIGMENT   Nitrite   (*)    Value: TEST NOT REPORTED DUE TO COLOR INTERFERENCE OF URINE PIGMENT   Leukocytes, UA   (*)    Value: TEST NOT REPORTED DUE TO COLOR INTERFERENCE OF URINE PIGMENT   All other components within normal limits  URINALYSIS, MICROSCOPIC (REFLEX) - Abnormal; Notable for the following:    Bacteria, UA MANY (*)    Squamous Epithelial / LPF 0-5 (*)    All other components within normal limits  URINE CULTURE  TROPONIN I  PREGNANCY, URINE  TROPONIN I    EKG  EKG Interpretation  Date/Time:  Sunday September 21 2016 12:57:11 EDT Ventricular Rate:  52 PR Interval:    QRS Duration: 95 QT Interval:  476 QTC Calculation: 443 R Axis:   92 Text Interpretation:  Sinus rhythm Borderline right axis deviation Borderline T abnormalities, anterior leads Confirmed by DELO  MD, DOUGLAS ([id number]) on 09/21/2016 1:22:12 PM       Radiology Ct Angio Chest Pe W And/or Wo Contrast  Result Date: 09/21/2016 CLINICAL DATA:  Pleuritic chest pain, elevated D-dimer and shortness of breath. EXAM: CT ANGIOGRAPHY CHEST WITH CONTRAST TECHNIQUE: Multidetector CT imaging  of the chest was performed using the standard protocol during bolus administration of intravenous contrast. Multiplanar CT image reconstructions and MIPs were obtained to evaluate the vascular anatomy. CONTRAST:  100 mL Isovue 370 COMPARISON:  Chest radiograph 09/02/2016 Chest CT 07/30/2012 FINDINGS: Cardiovascular: Contrast injection is sufficient to demonstrate satisfactory opacification of the pulmonary arteries to the segmental level. There is no pulmonary embolus. The main pulmonary artery is within normal limits for size. There is no CT evidence of acute right heart strain. The visualized aorta is normal. There is a normal 3-vessel arch branching pattern. Heart size is normal, without pericardial effusion. Mediastinum/Nodes: No mediastinal, hilar or axillary lymphadenopathy. The visualized thyroid and thoracic esophageal course are unremarkable. Lungs/Pleura: No pulmonary nodules or masses. No pleural effusion or pneumothorax. No focal airspace consolidation. No focal pleural abnormality. Upper Abdomen: Contrast bolus timing is not optimized for evaluation of the abdominal organs. Within this limitation, the visualized organs of the upper abdomen are normal. Musculoskeletal: No chest wall abnormality. No acute or significant osseous findings. Review of the MIP images confirms the above findings. IMPRESSION: No pulmonary embolus, acute aortic syndrome or other acute abnormality of the chest. Electronically Signed   By: Deatra Robinson M.D.   On: 09/21/2016 15:32    Procedures Procedures (including critical care time)  Medications Ordered in ED Medications  ketorolac (TORADOL) 30 MG/ML injection 30 mg (not administered)  iopamidol (ISOVUE-370) 76 % injection 100 mL (100 mLs Intravenous Contrast Given 09/21/16 1505)     Initial Impression / Assessment and Plan / ED Course  I have reviewed the triage vital signs and the nursing notes.  Pertinent labs & imaging results that were available during my  care of the patient were reviewed by me and considered in my medical decision making (see chart for details).     Patient with atypical chest pain, may be related to stress and anxiety. CBC shows stable chronic anemia, hemoglobin 10.8. BMP shows calcium 8.4. Delta troponin negative. D-dimer 0.73. CT angio chest shows no PE, acute aortic syndrome or other acute abnormality of the chest. EKG shows bradycardia, borderline right axis deviation, borderline T abnormalities.  Doubt ACS.  Back pain probably related to menstrual cycle or other musculoskeletal etiology. No midline tenderness or neurodeficits. Follow-up to PCP and cardiology for further evaluation of ongoing chest pain. Supportive treatment discussed with over-the-counter pain medications. Strict return precautions discussed. Patient vitals stable throughout ED course and discharged in satisfactory condition. I discussed patient case with Dr. Judd Lien who guided the patient's management and agrees with plan.   Final Clinical Impressions(s) / ED Diagnoses   Final diagnoses:  Nonspecific chest pain  Acute bilateral low back pain without sciatica    New Prescriptions New Prescriptions   No medications on file         Emi Holes, PA-C 09/21/16 1700    Geoffery Lyons, MD 09/22/16 [phone]

## 2016-09-21 NOTE — Discharge Instructions (Signed)
You can take ibuprofen and/or Tylenol as prescribed over-the-counter. Please follow-up with primary care provider and cardiologist for further evaluation and treatment of your symptoms. Please return to emergency department immediately if you develop any new or worsening symptoms.

## 2016-09-23 LAB — URINE CULTURE

## 2017-04-02 ENCOUNTER — Ambulatory Visit: Payer: Self-pay | Admitting: Hematology & Oncology

## 2017-04-02 ENCOUNTER — Other Ambulatory Visit: Payer: Self-pay

## 2017-10-21 ENCOUNTER — Other Ambulatory Visit: Payer: Self-pay

## 2017-10-21 ENCOUNTER — Encounter (HOSPITAL_BASED_OUTPATIENT_CLINIC_OR_DEPARTMENT_OTHER): Payer: Self-pay | Admitting: *Deleted

## 2017-10-21 ENCOUNTER — Emergency Department (HOSPITAL_BASED_OUTPATIENT_CLINIC_OR_DEPARTMENT_OTHER)
Admission: EM | Admit: 2017-10-21 | Discharge: 2017-10-21 | Disposition: A | Discharge status: Home / Self Care | Payer: Medicaid Other | Attending: Emergency Medicine | Admitting: Emergency Medicine

## 2017-10-21 ENCOUNTER — Emergency Department (HOSPITAL_BASED_OUTPATIENT_CLINIC_OR_DEPARTMENT_OTHER): Payer: Medicaid Other

## 2017-10-21 DIAGNOSIS — R51 Headache: Secondary | ICD-10-CM | POA: Diagnosis not present

## 2017-10-21 DIAGNOSIS — I10 Essential (primary) hypertension: Secondary | ICD-10-CM | POA: Insufficient documentation

## 2017-10-21 DIAGNOSIS — Z79899 Other long term (current) drug therapy: Secondary | ICD-10-CM | POA: Insufficient documentation

## 2017-10-21 DIAGNOSIS — M549 Dorsalgia, unspecified: Secondary | ICD-10-CM | POA: Diagnosis not present

## 2017-10-21 DIAGNOSIS — H538 Other visual disturbances: Secondary | ICD-10-CM | POA: Diagnosis not present

## 2017-10-21 DIAGNOSIS — R42 Dizziness and giddiness: Secondary | ICD-10-CM | POA: Insufficient documentation

## 2017-10-21 DIAGNOSIS — R202 Paresthesia of skin: Secondary | ICD-10-CM | POA: Insufficient documentation

## 2017-10-21 DIAGNOSIS — R0789 Other chest pain: Secondary | ICD-10-CM | POA: Insufficient documentation

## 2017-10-21 DIAGNOSIS — R079 Chest pain, unspecified: Secondary | ICD-10-CM

## 2017-10-21 LAB — CBC WITH DIFFERENTIAL/PLATELET
BASOS PCT: 0 %
Basophils Absolute: 0 10*3/uL (ref 0.0–0.1)
Eosinophils Absolute: 0.2 10*3/uL (ref 0.0–0.7)
Eosinophils Relative: 3 %
HEMATOCRIT: 35.8 % — AB (ref 36.0–46.0)
Hemoglobin: 12 g/dL (ref 12.0–15.0)
LYMPHS ABS: 2.5 10*3/uL (ref 0.7–4.0)
Lymphocytes Relative: 43 %
MCH: 29.3 pg (ref 26.0–34.0)
MCHC: 33.5 g/dL (ref 30.0–36.0)
MCV: 87.3 fL (ref 78.0–100.0)
MONO ABS: 0.6 10*3/uL (ref 0.1–1.0)
MONOS PCT: 10 %
NEUTROS ABS: 2.5 10*3/uL (ref 1.7–7.7)
Neutrophils Relative %: 44 %
Platelets: 294 10*3/uL (ref 150–400)
RBC: 4.1 MIL/uL (ref 3.87–5.11)
RDW: 14.7 % (ref 11.5–15.5)
WBC: 5.7 10*3/uL (ref 4.0–10.5)

## 2017-10-21 LAB — TROPONIN I
Troponin I: <0.03 ng/mL (ref <0.03)
Troponin I: <0.03 ng/mL (ref <0.03)

## 2017-10-21 LAB — BASIC METABOLIC PANEL
ANION GAP: 8 (ref 5–15)
BUN: 11 mg/dL (ref 6–20)
CALCIUM: 8.6 mg/dL — AB (ref 8.9–10.3)
CHLORIDE: 102 mmol/L (ref 101–111)
CO2: 25 mmol/L (ref 22–32)
Creatinine, Ser: 0.75 mg/dL (ref 0.44–1.00)
GFR calc non Af Amer: >60 mL/min (ref >60)
GLUCOSE: 86 mg/dL (ref 65–99)
POTASSIUM: 2.9 mmol/L — AB (ref 3.5–5.1)
Sodium: 135 mmol/L (ref 135–145)

## 2017-10-21 MED ORDER — POTASSIUM CHLORIDE 10 MEQ/100ML IV SOLN
10.0000 meq | INTRAVENOUS | Status: AC
  Administered 2017-10-21 (×2): 10 meq via INTRAVENOUS
  Filled 2017-10-21 (×2): qty 100

## 2017-10-21 MED ORDER — IOPAMIDOL (ISOVUE-370) INJECTION 76%
100.0000 mL | Freq: Once | INTRAVENOUS | Status: AC | PRN
  Administered 2017-10-21: 100 mL via INTRAVENOUS

## 2017-10-21 MED ORDER — POTASSIUM CHLORIDE CRYS ER 20 MEQ PO TBCR
40.0000 meq | EXTENDED_RELEASE_TABLET | Freq: Once | ORAL | Status: AC
  Administered 2017-10-21: 40 meq via ORAL
  Filled 2017-10-21: qty 2

## 2017-10-21 NOTE — Discharge Instructions (Signed)
It was my pleasure taking care of you today!  ° °You were seen in the Emergency Department today for chest pain.  As we have discussed, today’s blood work and imaging are normal, but you may require further testing. ° °Please call your primary care physician to schedule a follow up appointment to discuss your ER visit today.  ° °Return to the Emergency Department if you experience any further chest pain/pressure/tightness, difficulty breathing, sudden sweating, or other symptoms that concern you. °

## 2017-10-21 NOTE — ED Notes (Signed)
ED Provider at bedside. 

## 2017-10-21 NOTE — ED Provider Notes (Signed)
[facility] EMERGENCY DEPARTMENT Provider Note   CSN: [id number] Arrival date & time: 10/21/17  1405     History   Chief Complaint Chief Complaint  Patient presents with  . Chest Pain    HPI Jennifer Mcgrath is a 42 y.o. female.  The history is provided by the patient and medical records. No language interpreter was used.  Chest Pain   Associated symptoms include headaches and numbness. Pertinent negatives include no dizziness and no palpitations.   Jennifer Mcgrath is a 42 y.o. female  with a PMH of HTN who presents to the Emergency Department complaining of chest pain that began today around 10 AM.  She reports pain has been constant with no alleviating or aggravating factors.  It does radiate to her back.  She endorses associated right arm and right leg tingling/numb sensation which began at 10 AM as well.  She also had a headache.  She does not typically get headaches, but sometimes of her blood pressure gets high, she will have a headache.  She took her blood pressure medication this morning, but it did not make much of a difference.  No other medications taken prior to arrival for her symptoms.  She reports her blood pressure is typically pretty well controlled, in the 130s/90's on average. No history of similar. No shortness of breath, abdominal pain, n/v or diaphoresis.    Past Medical History:  Diagnosis Date  . Hypertension     There are no active problems to display for this patient.   Past Surgical History:  Procedure Laterality Date  . CESAREAN SECTION  1992, 1999, 2003, 2008     OB History   None      Home Medications    Prior to Admission medications   Medication Sig Start Date End Date Taking? Authorizing Provider  amLODipine (NORVASC) 5 MG tablet Take 5 mg by mouth every morning. 09/16/17  Yes [provider]  Aspirin-Acetaminophen-Caffeine (GOODY HEADACHE PO) Take 1 packet by mouth daily as needed.   Yes [provider]    hydrochlorothiazide (MICROZIDE) 12.5 MG capsule Take 12.5 mg by mouth daily.   Yes [provider]  Linaclotide (LINZESS) 145 MCG CAPS capsule Take 145 mcg by mouth daily.   Yes [provider]  loratadine (CLARITIN) 10 MG tablet Take 10 mg by mouth daily.   Yes [provider]  omeprazole (PRILOSEC) 20 MG capsule Take 20 mg by mouth every morning.   Yes [provider]  pantoprazole (PROTONIX) 20 MG tablet Take 1 tablet (20 mg total) by mouth daily. 08/21/16  Yes Pfeiffer, Lebron Conners, MD  potassium chloride SA (K-DUR,KLOR-CON) 20 MEQ tablet Take 2 tablets (40 mEq total) by mouth 2 (two) times daily. Patient not taking: Reported on 10/21/2017 09/02/16   Lyndal Pulley, MD    Family History History reviewed. No pertinent family history.  Social History Social History   Tobacco Use  . Smoking status: Never Smoker  . Smokeless tobacco: Never Used  Substance Use Topics  . Alcohol use: Yes    Comment: occasional   . Drug use: No     Allergies   Patient has no known allergies.   Review of Systems Review of Systems  Eyes: Positive for visual disturbance (Blurred vision).  Cardiovascular: Positive for chest pain. Negative for palpitations and leg swelling.  Neurological: Positive for numbness and headaches. Negative for dizziness.  All other systems reviewed and are negative.    Physical Exam Updated Vital Signs BP Marland Kitchen)  122/96   Pulse 72   Temp 99.5 F (37.5 C) (Oral)   Resp 20   Ht  (1.575 m)   Wt 77.1 kg (170 lb)   LMP 10/14/2017   SpO2 99%   BMI 31.09 kg/m   Physical Exam  Constitutional: She is oriented to person, place, and time. She appears well-developed and well-nourished. No distress.  HENT:  Head: Normocephalic and atraumatic.  Cardiovascular: Normal rate, regular rhythm and normal heart sounds.  No murmur heard. Intact and equal distal pulses.  Pulmonary/Chest: Effort normal and breath sounds normal. No respiratory  distress.  Abdominal: Soft. She exhibits no distension. There is no tenderness.  Musculoskeletal:  Full ROM and 5/5 muscle strength in all four extremities.  Neurological: She is alert and oriented to person, place, and time.  Alert, oriented, thought content appropriate, able to give a coherent history. Speech is clear and goal oriented, able to follow commands.  Cranial Nerves:  II:  Peripheral visual fields grossly normal, pupils equal, round, reactive to light III, IV, VI: EOM intact bilaterally, ptosis not present V,VII: smile symmetric, eyes kept closed tightly against resistance, facial light touch sensation equal VIII: hearing grossly normal IX, X: symmetric soft palate movement, uvula elevates symmetrically  XI: bilateral shoulder shrug symmetric and strong XII: midline tongue extension Normal finger-to-nose and rapid alternating movements; no drift. Sensation equal and intact to upper and lower extremities.  Skin: Skin is warm and dry.  Nursing note and vitals reviewed.    ED Treatments / Results  Labs (all labs ordered are listed, but only abnormal results are displayed) Labs Reviewed  CBC WITH DIFFERENTIAL/PLATELET - Abnormal; Notable for the following components:      Result Value   HCT 35.8 (*)    All other components within normal limits  BASIC METABOLIC PANEL - Abnormal; Notable for the following components:   Potassium 2.9 (*)    Calcium 8.6 (*)    All other components within normal limits  TROPONIN I  TROPONIN I    EKG EKG Interpretation  Date/Time:  Wednesday Oct 21 2017 14:12:44 EDT Ventricular Rate:  57 PR Interval:  168 QRS Duration: 82 QT Interval:  430 QTC Calculation: 418 R Axis:   76 Text Interpretation:  Sinus bradycardia Non-specific ST-t changes Confirmed by Raeford Razor [phone]) on 10/21/2017 2:43:18 PM   Radiology Ct Head Wo Contrast  Result Date: 10/21/2017 CLINICAL DATA:  Patient states that she has been having chest pains and back  pains since this morning, with a headache and some slight dizziness, also c/o right arm and leg numbness, hx of HTN, denies any injury, no other complaints EXAM: CT HEAD WITHOUT CONTRAST TECHNIQUE: Contiguous axial images were obtained from the base of the skull through the vertex without intravenous contrast. COMPARISON:  12/29/2012 FINDINGS: Brain: No evidence of acute infarction, hemorrhage, hydrocephalus, extra-axial collection or mass lesion/mass effect. Mild atrophy Vascular: No hyperdense vessel or unexpected calcification. Skull: Normal. Negative for fracture or focal lesion. Sinuses/Orbits: Hypoplastic frontal sinuses.  Otherwise negative. Other: None. IMPRESSION: Negative Electronically Signed   By: Corlis Leak M.D.   On: 10/21/2017 15:47   Ct Angio Chest/abd/pel For Dissection W And/or Wo Contrast  Result Date: 10/21/2017 CLINICAL DATA:  Patient states that she has been having chest pains and back pains since this morning, with a headache and some slight dizziness, also c/o right arm and leg numbness, hx of HTN, denies any injury, no other complaints^ EXAM: CT ANGIOGRAPHY CHEST, ABDOMEN AND  PELVIS TECHNIQUE: Multidetector CT imaging through the chest, abdomen and pelvis was performed using the standard protocol during bolus administration of intravenous contrast. Multiplanar reconstructed images and MIPs were obtained and reviewed to evaluate the vascular anatomy. CONTRAST:  ISOVUE-370 IOPAMIDOL (ISOVUE-370) INJECTION 76% COMPARISON:  09/21/2016 FINDINGS: CTA CHEST FINDINGS Cardiovascular: Heart size normal. No pericardial effusion. Satisfactory opacification of pulmonary arteries noted, and there is no evidence of pulmonary emboli. Adequate contrast opacification of the thoracic aorta with no evidence of dissection, aneurysm, or stenosis. There is classic 3-vessel brachiocephalic arch anatomy without proximal stenosis. No significant atheromatous change. Mediastinum/Nodes: No hilar or  mediastinal adenopathy. Lungs/Pleura: Lungs are clear. No pleural effusion or pneumothorax. Musculoskeletal: No chest wall abnormality. No acute or significant osseous findings. Review of the MIP images confirms the above findings. CTA ABDOMEN AND PELVIS FINDINGS VASCULAR Aorta: Normal caliber aorta without aneurysm, dissection, vasculitis or significant stenosis. Celiac: Patent without evidence of aneurysm, dissection, vasculitis or significant stenosis. SMA: Patent without evidence of aneurysm, dissection, vasculitis or significant stenosis. Renals: Both renal arteries are patent without evidence of aneurysm, dissection, vasculitis, fibromuscular dysplasia or significant stenosis. IMA: Patent without evidence of aneurysm, dissection, vasculitis or significant stenosis. Inflow: Patent without evidence of aneurysm, dissection, vasculitis or significant stenosis. Veins: No obvious venous abnormality within the limitations of this arterial phase study. Review of the MIP images confirms the above findings. NON-VASCULAR Hepatobiliary: probable cysts in hepatic segments 7 and 8 Stable since 05/20/2013. No new focal liver abnormality is seen. No gallstones, gallbladder wall thickening, or biliary dilatation. Pancreas: Unremarkable. No pancreatic ductal dilatation or surrounding inflammatory changes. Spleen: Normal in size without focal abnormality. Adrenals/Urinary Tract: Adrenal glands are unremarkable. Kidneys are normal, without renal calculi, focal lesion, or hydronephrosis. Bladder is unremarkable. Stomach/Bowel: Stomach is within normal limits. Appendix appears normal. No evidence of bowel wall thickening, distention, or inflammatory changes. Lymphatic: No abdominal or pelvic adenopathy. Reproductive: Enlarged heterogeneous uterus suggesting leiomyomata. No adnexal mass. Other: No ascites.  No free air. Musculoskeletal: No acute or significant osseous findings. Review of the MIP images confirms the above findings.  IMPRESSION: 1. Negative for aortic dissection or other acute findings. 2. 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 Electronically Signed   By: Corlis Leak M.D.   On: 10/21/2017 15:45    Procedures Procedures (including critical care time)  Medications Ordered in ED Medications  iopamidol (ISOVUE-370) 76 % injection 100 mL (100 mLs Intravenous Contrast Given 10/21/17 1509)  potassium chloride 10 mEq in 100 mL IVPB (0 mEq Intravenous Stopped 10/21/17 1832)  potassium chloride SA (K-DUR,KLOR-CON) CR tablet 40 mEq (40 mEq Oral Given 10/21/17 1556)     Initial Impression / Assessment and Plan / ED Course  I have reviewed the triage vital signs and the nursing notes.  Pertinent labs & imaging results that were available during my care of the patient were reviewed by me and considered in my medical decision making (see chart for details).    Jennifer Mcgrath is a 42 y.o. female who presents to ED for constant central chest pain radiating to the back which began this morning around 10 am. Associated with headache and right upper/lower extremity numbness. Hx of HTN, typically well controlled per patient. She is hypertensive in ED today. BP of 147/106 in RUE and 117/109 in LUE. No weakness appreciated on exam. No focal neuro deficits. History concerning for dissection, therefore CT angio obtained and negative for dissection - no other acute findings noted. CT head obtained given hypertension and headache.  This was reviewed, showing no acute findings. Labs were reviewed: K+ of 2.9 -- Replenished while in ED today. Troponin negative x2. EKG non-ischemic. Low risk heart score. Evaluation does not show pathology that would require ongoing emergent intervention or inpatient treatment. She has PCP and will follow up with them. Reasons to return to ER were discussed and all questions answered.   Patient seen by and discussed with Dr. Juleen China who agrees with treatment plan.    Final Clinical Impressions(s) / ED  Diagnoses   Final diagnoses:  Chest pain    ED Discharge Orders    None       Ward, Chase Picket, PA-C 10/21/17 1845    Raeford Razor, MD 10/28/17 1343

## 2017-10-21 NOTE — ED Triage Notes (Signed)
Pt c/o waking up with CP, back pain this am

## 2018-04-03 ENCOUNTER — Other Ambulatory Visit: Payer: Self-pay

## 2018-04-03 ENCOUNTER — Emergency Department (HOSPITAL_BASED_OUTPATIENT_CLINIC_OR_DEPARTMENT_OTHER): Payer: Medicaid Other

## 2018-04-03 ENCOUNTER — Emergency Department (HOSPITAL_BASED_OUTPATIENT_CLINIC_OR_DEPARTMENT_OTHER)
Admission: EM | Admit: 2018-04-03 | Discharge: 2018-04-03 | Disposition: A | Discharge status: Home / Self Care | Payer: Medicaid Other | Attending: Emergency Medicine | Admitting: Emergency Medicine

## 2018-04-03 ENCOUNTER — Encounter (HOSPITAL_BASED_OUTPATIENT_CLINIC_OR_DEPARTMENT_OTHER): Payer: Self-pay | Admitting: Emergency Medicine

## 2018-04-03 DIAGNOSIS — A5900 Urogenital trichomoniasis, unspecified: Secondary | ICD-10-CM | POA: Diagnosis not present

## 2018-04-03 DIAGNOSIS — I10 Essential (primary) hypertension: Secondary | ICD-10-CM

## 2018-04-03 DIAGNOSIS — R112 Nausea with vomiting, unspecified: Secondary | ICD-10-CM | POA: Insufficient documentation

## 2018-04-03 DIAGNOSIS — E876 Hypokalemia: Secondary | ICD-10-CM

## 2018-04-03 DIAGNOSIS — Z79899 Other long term (current) drug therapy: Secondary | ICD-10-CM | POA: Insufficient documentation

## 2018-04-03 DIAGNOSIS — R1031 Right lower quadrant pain: Secondary | ICD-10-CM | POA: Diagnosis not present

## 2018-04-03 DIAGNOSIS — R197 Diarrhea, unspecified: Secondary | ICD-10-CM

## 2018-04-03 LAB — WET PREP, GENITAL
Clue Cells Wet Prep HPF POC: NONE SEEN
Sperm: NONE SEEN
Yeast Wet Prep HPF POC: NONE SEEN

## 2018-04-03 LAB — COMPREHENSIVE METABOLIC PANEL
ALK PHOS: 43 U/L (ref 38–126)
ALT: 20 U/L (ref 0–44)
ANION GAP: 10 (ref 5–15)
AST: 25 U/L (ref 15–41)
Albumin: 3.7 g/dL (ref 3.5–5.0)
BUN: 12 mg/dL (ref 6–20)
CALCIUM: 8.7 mg/dL — AB (ref 8.9–10.3)
CO2: 28 mmol/L (ref 22–32)
Chloride: 104 mmol/L (ref 98–111)
Creatinine, Ser: 0.71 mg/dL (ref 0.44–1.00)
GFR calc non Af Amer: >60 mL/min (ref >60)
Glucose, Bld: 91 mg/dL (ref 70–99)
POTASSIUM: 3 mmol/L — AB (ref 3.5–5.1)
Sodium: 142 mmol/L (ref 135–145)
Total Bilirubin: 0.6 mg/dL (ref 0.3–1.2)
Total Protein: 8.1 g/dL (ref 6.5–8.1)

## 2018-04-03 LAB — URINALYSIS, ROUTINE W REFLEX MICROSCOPIC
BILIRUBIN URINE: NEGATIVE
GLUCOSE, UA: NEGATIVE mg/dL
KETONES UR: NEGATIVE mg/dL
LEUKOCYTES UA: NEGATIVE
NITRITE: NEGATIVE
PH: 8.5 — AB (ref 5.0–8.0)
Protein, ur: 30 mg/dL — AB
Specific Gravity, Urine: 1.015 (ref 1.005–1.030)

## 2018-04-03 LAB — URINALYSIS, MICROSCOPIC (REFLEX)

## 2018-04-03 LAB — PREGNANCY, URINE: Preg Test, Ur: NEGATIVE

## 2018-04-03 LAB — CBC
HEMATOCRIT: 38.2 % (ref 36.0–46.0)
Hemoglobin: 12.4 g/dL (ref 12.0–15.0)
MCH: 29.1 pg (ref 26.0–34.0)
MCHC: 32.5 g/dL (ref 30.0–36.0)
MCV: 89.7 fL (ref 80.0–100.0)
NRBC: 0 % (ref 0.0–0.2)
PLATELETS: 366 10*3/uL (ref 150–400)
RBC: 4.26 MIL/uL (ref 3.87–5.11)
RDW: 13.5 % (ref 11.5–15.5)
WBC: 5.2 10*3/uL (ref 4.0–10.5)

## 2018-04-03 LAB — LIPASE, BLOOD: LIPASE: 25 U/L (ref 11–51)

## 2018-04-03 MED ORDER — ONDANSETRON HCL 4 MG/2ML IJ SOLN
4.0000 mg | Freq: Once | INTRAMUSCULAR | Status: AC | PRN
  Administered 2018-04-03: 4 mg via INTRAVENOUS
  Filled 2018-04-03: qty 2

## 2018-04-03 MED ORDER — METOCLOPRAMIDE HCL 10 MG PO TABS: 10.0000 mg | ORAL_TABLET | Freq: Four times a day (QID) | ORAL | 0 refills | Status: DC

## 2018-04-03 MED ORDER — ONDANSETRON 4 MG PO TBDP: 4.0000 mg | ORAL_TABLET | Freq: Three times a day (TID) | ORAL | 0 refills | Status: DC | PRN

## 2018-04-03 MED ORDER — SODIUM CHLORIDE 0.9 % IV BOLUS
1000.0000 mL | Freq: Once | INTRAVENOUS | Status: AC
  Administered 2018-04-03: 1000 mL via INTRAVENOUS

## 2018-04-03 MED ORDER — METOCLOPRAMIDE HCL 5 MG/ML IJ SOLN
10.0000 mg | Freq: Once | INTRAMUSCULAR | Status: AC
  Administered 2018-04-03: 10 mg via INTRAVENOUS
  Filled 2018-04-03: qty 2

## 2018-04-03 MED ORDER — POTASSIUM CHLORIDE CRYS ER 20 MEQ PO TBCR
40.0000 meq | EXTENDED_RELEASE_TABLET | Freq: Once | ORAL | Status: AC
  Administered 2018-04-03: 40 meq via ORAL
  Filled 2018-04-03: qty 2

## 2018-04-03 MED ORDER — METRONIDAZOLE 500 MG PO TABS: 2000.0000 mg | ORAL_TABLET | Freq: Once | ORAL | 0 refills | Status: AC

## 2018-04-03 MED ORDER — IOPAMIDOL (ISOVUE-300) INJECTION 61%
100.0000 mL | Freq: Once | INTRAVENOUS | Status: AC | PRN
  Administered 2018-04-03: 100 mL via INTRAVENOUS

## 2018-04-03 MED ORDER — POTASSIUM CHLORIDE CRYS ER 20 MEQ PO TBCR: 40.0000 meq | EXTENDED_RELEASE_TABLET | Freq: Every day | ORAL | 0 refills | Status: DC

## 2018-04-03 NOTE — ED Notes (Signed)
Patient transported to CT and returned 

## 2018-04-03 NOTE — ED Notes (Signed)
ED Provider at bedside. 

## 2018-04-03 NOTE — ED Notes (Signed)
Provider notified that patient is ready for pelvic exam and supplies at bedside

## 2018-04-03 NOTE — Discharge Instructions (Addendum)
Today you were diagnosed with trichomoniasis which is a sexually transmitted infection.  The treatment for this is a one-time dose of Flagyl also known as metronidazole.  As you have consumed alcohol in the past 24 hours we were unable to give it to you in the department here today.  Once it has been 24 hours since you consumed alcohol please take the entire dose at once.  Please do not drink alcohol for 3 days after taking it.  Today you have been tested for gonorrhea and chlamydia.  The test to determine if you have these will take somewhere between 1 to 4 days to come back.  If they are positive you will be contacted and you will need to get treatment.  Please make sure that you are staying well-hydrated.  Today your potassium was slightly low, this is most likely due to the nausea and vomiting.  Please eat high potassium foods over the next few days including bananas.  Please follow-up with your primary care doctor in 1 week to evaluate for need for recheck.  I have given you two nausea prescriptions today.  While in the department Reglan also known as metoclopramide appeared to help your nausea better.  I have also given you a prescription for Zofran which is a different nausea medicine as this dissolves in your mouth if you are unable to take the Reglan.

## 2018-04-03 NOTE — ED Notes (Signed)
Pt on auto VS  

## 2018-04-03 NOTE — ED Triage Notes (Signed)
Vomiting and diarrhea since this morning. Denies pain.  

## 2018-04-03 NOTE — ED Provider Notes (Signed)
[facility] EMERGENCY DEPARTMENT Provider Note   CSN: [id number] Arrival date & time: 04/03/18  1336     History   Chief Complaint Chief Complaint  Patient presents with  . Emesis  . Diarrhea    HPI Jennifer Mcgrath is a 42 y.o. female with past medical history of hypertension who presents today for evaluation of lower abdominal pain, nausea vomiting and diarrhea.  She reports that when she went to bed last night she was feeling okay however all of her symptoms started this morning.  She has vomited more than 10 times and only had one episode of a loose bowel movement.  She reports that she has been peeing more than usual for the past 2 weeks.  She denies any fevers or chills.  No known sick contacts and none of her other household contacts are sick currently.  She denies any abnormal vaginal discharge.  No menstrual concerns.    HPI  Past Medical History:  Diagnosis Date  . Hypertension     There are no active problems to display for this patient.   Past Surgical History:  Procedure Laterality Date  . CESAREAN SECTION  1992, 1999, 2003, 2008     OB History   None      Home Medications    Prior to Admission medications   Medication Sig Start Date End Date Taking? Authorizing Provider  amLODipine (NORVASC) 5 MG tablet Take 5 mg by mouth every morning. 09/16/17  Yes [provider]  Aspirin-Acetaminophen-Caffeine (GOODY HEADACHE PO) Take 1 packet by mouth daily as needed.   Yes [provider]  hydrochlorothiazide (MICROZIDE) 12.5 MG capsule Take 12.5 mg by mouth daily.   Yes [provider]  Linaclotide (LINZESS) 145 MCG CAPS capsule Take 145 mcg by mouth daily.   Yes [provider]  loratadine (CLARITIN) 10 MG tablet Take 10 mg by mouth daily.    [provider]  metoCLOPramide (REGLAN) 10 MG tablet Take 1 tablet (10 mg total) by mouth every 6 (six) hours. 04/03/18   Cristina Gong, PA-C  omeprazole (PRILOSEC)  20 MG capsule Take 20 mg by mouth every morning.    [provider]  ondansetron (ZOFRAN ODT) 4 MG disintegrating tablet Take 1 tablet (4 mg total) by mouth every 8 (eight) hours as needed for nausea or vomiting. 04/03/18   Cristina Gong, PA-C  pantoprazole (PROTONIX) 20 MG tablet Take 1 tablet (20 mg total) by mouth daily. 08/21/16   Arby Barrette, MD  potassium chloride SA (K-DUR,KLOR-CON) 20 MEQ tablet Take 2 tablets (40 mEq total) by mouth daily for 7 days. 04/03/18 04/10/18  Cristina Gong, PA-C    Family History No family history on file.  Social History Social History   Tobacco Use  . Smoking status: Never Smoker  . Smokeless tobacco: Never Used  Substance Use Topics  . Alcohol use: Yes    Comment: occasional   . Drug use: No     Allergies   Patient has no known allergies.   Review of Systems Review of Systems  Constitutional: Negative for chills and fever.  HENT: Negative for congestion, postnasal drip, rhinorrhea and sinus pain.   Respiratory: Positive for shortness of breath (Only when stands up. ). Negative for cough and wheezing.   Cardiovascular: Negative for chest pain.  Gastrointestinal: Positive for abdominal pain, diarrhea, nausea and vomiting. Negative for constipation.  Genitourinary: Positive for frequency. Negative for difficulty urinating, dysuria, flank pain, hematuria, menstrual problem,  pelvic pain, urgency, vaginal bleeding, vaginal discharge and vaginal pain.  Skin: Negative for color change and rash.  Neurological: Positive for light-headedness (Only when stands up. ). Negative for dizziness, weakness and headaches.  All other systems reviewed and are negative.    Physical Exam Updated Vital Signs BP 105/75 (BP Location: Right Arm)   Pulse 71   Temp 99.9 F (37.7 C) (Oral)   Resp 18   Ht 5\' 2"  (1.575 m)   Wt 78 kg   LMP 03/24/2018   SpO2 100%   BMI 31.46 kg/m   Physical Exam  Constitutional: She is oriented to  person, place, and time. She appears well-developed and well-nourished. No distress.  HENT:  Head: Normocephalic and atraumatic.  Mouth/Throat: Oropharynx is clear and moist.  Eyes: Conjunctivae are normal.  Neck: Normal range of motion. Neck supple.  Cardiovascular: Normal rate, regular rhythm, normal heart sounds and intact distal pulses.  No murmur heard. Pulmonary/Chest: Effort normal and breath sounds normal. No accessory muscle usage. No respiratory distress. She has no decreased breath sounds. She has no wheezes. She has no rhonchi. She has no rales.  Abdominal: Soft. Normal appearance and bowel sounds are normal. She exhibits no distension. There is tenderness in the right lower quadrant and suprapubic area. There is tenderness at McBurney's point. There is no rigidity, no rebound, no guarding and no CVA tenderness.  Genitourinary:  Genitourinary Comments: Exam performed with EMT chaperone in room.  There is watery, murky moderate amount of discharge in the vaginal canal.  There is no adnexal tenderness or fullness.  No cervical motion tenderness.  Normal external female genitalia.  Musculoskeletal: She exhibits no edema.  Neurological: She is alert and oriented to person, place, and time.  Skin: Skin is warm and dry. She is not diaphoretic.  Psychiatric: She has a normal mood and affect. Her behavior is normal.  Nursing note and vitals reviewed.    ED Treatments / Results  Labs (all labs ordered are listed, but only abnormal results are displayed) Labs Reviewed  WET PREP, GENITAL - Abnormal; Notable for the following components:      Result Value   Trich, Wet Prep PRESENT (*)    WBC, Wet Prep HPF POC MODERATE (*)    All other components within normal limits  URINALYSIS, ROUTINE W REFLEX MICROSCOPIC - Abnormal; Notable for the following components:   pH 8.5 (*)    Hgb urine dipstick LARGE (*)    Protein, ur 30 (*)    All other components within normal limits  COMPREHENSIVE  METABOLIC PANEL - Abnormal; Notable for the following components:   Potassium 3.0 (*)    Calcium 8.7 (*)    All other components within normal limits  URINALYSIS, MICROSCOPIC (REFLEX) - Abnormal; Notable for the following components:   Bacteria, UA RARE (*)    Trichomonas, UA PRESENT (*)    All other components within normal limits  URINE CULTURE  CBC  PREGNANCY, URINE  LIPASE, BLOOD  RPR  HIV ANTIBODY (ROUTINE TESTING W REFLEX)  GC/CHLAMYDIA PROBE AMP ([place]) NOT AT [facility]    EKG None  Radiology Ct Abdomen Pelvis W Contrast  Result Date: 04/03/2018 CLINICAL DATA:  Right lower quadrant abdominal pain. EXAM: CT ABDOMEN AND PELVIS WITH CONTRAST TECHNIQUE: Multidetector CT imaging of the abdomen and pelvis was performed using the standard protocol following bolus administration of intravenous contrast. CONTRAST:  ISOVUE-300 IOPAMIDOL (ISOVUE-300) INJECTION 61% COMPARISON:  05/20/2013 FINDINGS: Lower chest: No acute abnormality.  Hepatobiliary: Numerous liver cysts, and some hypoattenuated subcentimeter nodules throughout the liver which is too small to be actually characterize by CT. Normal appearance of the gallbladder. Pancreas: Unremarkable. No pancreatic ductal dilatation or surrounding inflammatory changes. Spleen: Normal in size without focal abnormality. Adrenals/Urinary Tract: Adrenal glands are unremarkable. Kidneys are normal, without renal calculi, focal lesion, or hydronephrosis. Bladder is unremarkable. Stomach/Bowel: Stomach is within normal limits. Appendix appears normal. No evidence of bowel wall thickening, distention, or inflammatory changes. Scattered colonic diverticulosis without evidence of acute diverticulitis. Vascular/Lymphatic: No significant vascular findings are present. No enlarged abdominal or pelvic lymph nodes. Reproductive: Uterus and bilateral adnexa are unremarkable. Other: No abdominal wall hernia or abnormality. No abdominopelvic ascites.  Musculoskeletal: No acute or significant osseous findings. IMPRESSION: No evidence of acute abnormalities within the abdomen or pelvis. Scattered colonic diverticulosis without evidence of diverticulitis. Electronically Signed   By: Ted Mcalpine M.D.   On: 04/03/2018 17:24    Procedures Procedures (including critical care time)  Medications Ordered in ED Medications  sodium chloride 0.9 % bolus 1,000 mL (0 mLs Intravenous Stopped 04/03/18 1903)  ondansetron (ZOFRAN) injection 4 mg (4 mg Intravenous Given 04/03/18 1443)  metoCLOPramide (REGLAN) injection 10 mg (10 mg Intravenous Given 04/03/18 1608)  iopamidol (ISOVUE-300) 61 % injection 100 mL (100 mLs Intravenous Contrast Given 04/03/18 1646)  potassium chloride SA (K-DUR,KLOR-CON) CR tablet 40 mEq (40 mEq Oral Given 04/03/18 1841)     Initial Impression / Assessment and Plan / ED Course  I have reviewed the triage vital signs and the nursing notes.  Pertinent labs & imaging results that were available during my care of the patient were reviewed by me and considered in my medical decision making (see chart for details).  Clinical Course as of Apr 04 42  Sat Apr 03, 2018  9604 Patient reports that she has been with the same person for many years.  Given normal-appearing CT scan without cause for her symptoms, in addition to possible UTI, along with trichomonas, concern for other sexually transmitted infections.  Patient requested HIV and RPR testing both of which were ordered.  She also requested testing for gonorrhea chlamydia.  Given the location of her symptoms a pelvic exam is reasonable.  Trichomonas, UA(!): PRESENT [EH]  2048 Patient has had alcohol in the last 24 hours.   Robynn Pane Prep(!): PRESENT [EH]    Clinical Course User Index [EH] Cristina Gong, PA-C   Patient presents today for evaluation of nausea vomiting and diarrhea since this morning.  She initially had midline and right lower quadrant abdominal pain.   Discussed evaluation options with patient.  She does not have leukocytosis, she is not anemic.  Her potassium was low at 3.0, she reports that she has been taking her potassium pills at home, however has switched to over-the-counter rather than prescription.  This was orally repleted in the emergency room and she is discharged with a prescription for 1 week of potassium.  Her kidney and liver functions were both normal.  Her urine showed large blood, 30 protein, negative nitrite and leukocytes, microscopic showed rare bacteria, 21-50 red blood cells, and trichomoniasis present.  Since pregnancy test was negative.  Discussed with patient indication for pelvic exam given presence of trichomoniasis, concern for other sexually transmitted infections.  Pelvic exam was performed showing a moderate amount of watery, cloudy discharge.  No adnexal fullness or tenderness and no cervical motion tenderness.  Patient is offered empiric treatment for gonorrhea chlamydia which she  declined.  Patient has had alcohol in the past 24 hours, therefore concern if give Flagyl in the department for a disulfiram reaction.  Therefore she is given prescription for Flagyl to treat her trichomoniasis at home.  Instructed on need to take the medications and abstain from sexual activity.  Instructed that partners will need treatment.    While in the emergency room patient's nausea was treated with Zofran which provided mild improvement however she still remained nauseous.  EKG was obtained, QTC was not elevated.  She was given Reglan which fully resolved her nausea.  The prescriptions for both Zofran and Reglan, as Reglan appears to work better for her, however she will have ODT Zofran in case she is actively vomiting.  Return precautions were discussed with patient who states their understanding.  At the time of discharge patient denied any unaddressed complaints or concerns.  Patient is agreeable for discharge home.   Final Clinical  Impressions(s) / ED Diagnoses   Final diagnoses:  Nausea vomiting and diarrhea  Trichomoniasis, urogenital  Hypertension, unspecified type  Hypokalemia    ED Discharge Orders         Ordered    potassium chloride SA (K-DUR,KLOR-CON) 20 MEQ tablet  Daily     04/03/18 2108    metoCLOPramide (REGLAN) 10 MG tablet  Every 6 hours     04/03/18 2108    ondansetron (ZOFRAN ODT) 4 MG disintegrating tablet  Every 8 hours PRN     04/03/18 2108    metroNIDAZOLE (FLAGYL) 500 MG tablet   Once     04/03/18 2108           Cristina Gong, PA-C 04/04/18 Marcie Bal    Arby Barrette, MD 04/05/18 [phone]

## 2018-04-03 NOTE — ED Notes (Signed)
ED Provider at bedside. PA Jeraldine Loots

## 2018-04-03 NOTE — ED Notes (Signed)
Pt understood dc material. NAD noted. Scripts given at dc 

## 2018-04-03 NOTE — ED Notes (Signed)
Pt given ginger ale and graham crackers.

## 2018-04-05 LAB — GC/CHLAMYDIA PROBE AMP (~~LOC~~) NOT AT ARMC
Chlamydia: NEGATIVE
Neisseria Gonorrhea: NEGATIVE

## 2018-04-05 LAB — RPR: RPR Ser Ql: NONREACTIVE

## 2018-04-05 LAB — URINE CULTURE: Culture: <10000 — AB

## 2018-04-06 LAB — HIV ANTIBODY (ROUTINE TESTING W REFLEX): HIV Screen 4th Generation wRfx: NONREACTIVE

## 2018-06-02 HISTORY — PX: TUBAL LIGATION: SHX77

## 2019-06-08 ENCOUNTER — Emergency Department (HOSPITAL_BASED_OUTPATIENT_CLINIC_OR_DEPARTMENT_OTHER)
Admission: EM | Admit: 2019-06-08 | Discharge: 2019-06-09 | Disposition: A | Discharge status: Home / Self Care | Payer: Medicaid Other | Attending: Emergency Medicine | Admitting: Emergency Medicine

## 2019-06-08 ENCOUNTER — Other Ambulatory Visit: Payer: Self-pay

## 2019-06-08 ENCOUNTER — Encounter (HOSPITAL_BASED_OUTPATIENT_CLINIC_OR_DEPARTMENT_OTHER): Payer: Self-pay | Admitting: Emergency Medicine

## 2019-06-08 DIAGNOSIS — Z79899 Other long term (current) drug therapy: Secondary | ICD-10-CM | POA: Insufficient documentation

## 2019-06-08 DIAGNOSIS — R509 Fever, unspecified: Secondary | ICD-10-CM | POA: Diagnosis present

## 2019-06-08 DIAGNOSIS — Z20822 Contact with and (suspected) exposure to covid-19: Secondary | ICD-10-CM

## 2019-06-08 DIAGNOSIS — I10 Essential (primary) hypertension: Secondary | ICD-10-CM | POA: Insufficient documentation

## 2019-06-08 DIAGNOSIS — U071 COVID-19: Secondary | ICD-10-CM | POA: Diagnosis not present

## 2019-06-08 LAB — SARS CORONAVIRUS 2 AG (30 MIN TAT): SARS Coronavirus 2 Ag: NEGATIVE

## 2019-06-08 NOTE — ED Notes (Signed)
Patient spoke with the PA and stated that when she went home and sprayed some Lysol in the room, she can't smell.  She asked if she can be tested for Covid.  Charge nurse is aware and checked her in.

## 2019-06-08 NOTE — Discharge Instructions (Addendum)
Your rapid COVID test is negative. I have sent for an outpatient COVID test. You should find out the results within the next 3 days. Continue to self quarantine until results become available. Return to the ER for new or worsening symptoms.

## 2019-06-08 NOTE — ED Provider Notes (Signed)
[facility] EMERGENCY DEPARTMENT Provider Note   CSN: [id number] Arrival date & time: 06/08/19  1751     History Chief Complaint  Patient presents with  . covid testing    Jennifer Mcgrath is a 44 y.o. female with a past medical history significant for hypertension who presents to the ED for evaluation of her 39 day old daughter with a fever. Upon evaluation of daughter, patient notes she has been experiencing loss of smell for the past few days and is worried about possible COVID infection. Patient admits she was cleaning the house with Lysol and was unable to smell. Patient denies associative symptoms of fever, cough, shortness of breath, chest pain, abdominal pain, nausea, vomiting, sore throat and diarrhea. Patient has not tried anything for her symptoms. No known COVID exposures or sick contacts, but she was just discharged from the hospital after she gave birth to her daughter 9 days prior. Husband is at bedside and denies similar symptoms.     Past Medical History:  Diagnosis Date  . Hypertension     There are no problems to display for this patient.   Past Surgical History:  Procedure Laterality Date  . CESAREAN SECTION  1992, 1999, 2003, 2008     OB History   No obstetric history on file.     History reviewed. No pertinent family history.  Social History   Tobacco Use  . Smoking status: Never Smoker  . Smokeless tobacco: Never Used  Substance Use Topics  . Alcohol use: Yes    Comment: occasional   . Drug use: No    Home Medications Prior to Admission medications   Medication Sig Start Date End Date Taking? Authorizing Provider  amLODipine (NORVASC) 5 MG tablet Take 5 mg by mouth every morning. 09/16/17   [provider]  Aspirin-Acetaminophen-Caffeine (GOODY HEADACHE PO) Take 1 packet by mouth daily as needed.    [provider]  hydrochlorothiazide (MICROZIDE) 12.5 MG capsule Take 12.5 mg by mouth daily.    [provider]   Linaclotide Karlene Einstein) 145 MCG CAPS capsule Take 145 mcg by mouth daily.    [provider]  loratadine (CLARITIN) 10 MG tablet Take 10 mg by mouth daily.    [provider]  metoCLOPramide (REGLAN) 10 MG tablet Take 1 tablet (10 mg total) by mouth every 6 (six) hours. 04/03/18   Cristina Gong, PA-C  omeprazole (PRILOSEC) 20 MG capsule Take 20 mg by mouth every morning.    [provider]  ondansetron (ZOFRAN ODT) 4 MG disintegrating tablet Take 1 tablet (4 mg total) by mouth every 8 (eight) hours as needed for nausea or vomiting. 04/03/18   Cristina Gong, PA-C  pantoprazole (PROTONIX) 20 MG tablet Take 1 tablet (20 mg total) by mouth daily. 08/21/16   Arby Barrette, MD  potassium chloride SA (K-DUR,KLOR-CON) 20 MEQ tablet Take 2 tablets (40 mEq total) by mouth daily for 7 days. 04/03/18 04/10/18  Cristina Gong, PA-C    Allergies    Patient has no known allergies.  Review of Systems   Review of Systems  Constitutional: Negative for chills and fever.  HENT: Negative for congestion, sneezing, sore throat and trouble swallowing.   Respiratory: Negative for cough and shortness of breath.   Cardiovascular: Negative for chest pain.  Gastrointestinal: Negative for abdominal pain, diarrhea, nausea and vomiting.    Physical Exam Updated Vital Signs BP (!) 143/107 (BP Location: Right Arm)   Pulse 99   Temp  99.1 F (37.3 C) (Oral)   Resp 14   Ht 5\' 2"  (1.575 m)   Wt 87.1 kg   LMP 09/06/2018   SpO2 97%   BMI 35.12 kg/m   Physical Exam Vitals and nursing note reviewed.  Constitutional:      General: She is not in acute distress.    Appearance: She is not ill-appearing.  HENT:     Head: Normocephalic.     Nose: Nose normal.     Mouth/Throat:     Mouth: Mucous membranes are moist.     Pharynx: Oropharynx is clear. No oropharyngeal exudate or posterior oropharyngeal erythema.  Eyes:     Conjunctiva/sclera: Conjunctivae normal.  Neck:      Comments: No meningismus Cardiovascular:     Rate and Rhythm: Normal rate and regular rhythm.     Pulses: Normal pulses.     Heart sounds: Normal heart sounds. No murmur. No friction rub. No gallop.   Pulmonary:     Effort: Pulmonary effort is normal.     Breath sounds: Normal breath sounds.     Comments: Respirations equal and unlabored, patient able to speak in full sentences, lungs clear to auscultation bilaterally Abdominal:     General: Abdomen is flat. There is no distension.     Palpations: Abdomen is soft.     Tenderness: There is no abdominal tenderness. There is no guarding or rebound.  Musculoskeletal:     Cervical back: Neck supple.     Comments: Able to move all 4 extremities without difficulty. No lower extremity edema.   Skin:    General: Skin is warm.  Neurological:     General: No focal deficit present.     Mental Status: She is alert.     ED Results / Procedures / Treatments   Labs (all labs ordered are listed, but only abnormal results are displayed) Labs Reviewed  SARS CORONAVIRUS 2 AG (30 MIN TAT)  NOVEL CORONAVIRUS, NAA (HOSP ORDER, SEND-OUT TO REF LAB; TAT 18-24 HRS)    EKG None  Radiology No results found.  Procedures Procedures (including critical care time)  Medications Ordered in ED Medications - No data to display  ED Course  I have reviewed the triage vital signs and the nursing notes.  Pertinent labs & imaging results that were available during my care of the patient were reviewed by me and considered in my medical decision making (see chart for details).    MDM Rules/Calculators/A&P                      44 year old female presents to the ED for an evaluation of her febrile 23 day old daughter. During evaluation of daughter, patient notes loss of smell and is concerned about possible COVID infection. She denies COVID exposure and sick contacts, but was just recently discharged from the hospital after her delivery. No complaints except  loss of smell. Stable vitals. Patient is afebrile, not tachycardic or hypoxic. Patient in no acute distress and non-ill appearing. Physical exam unremarkable. Rapid COVID test negative. PCR COVID test pending. Patient able to ambulate in room and maintained O2 saturation >95%. Instructed patient to self quarantine until COVID results become available. Strict ED precautions discussed with patient. Patient states understanding and agrees to plan. Patient discharged home in no acute distress and stable vitals  Diego Delancey was evaluated in Emergency Department on 06/09/2019 for the symptoms described in the history of present illness. She was evaluated  in the context of the global COVID-19 pandemic, which necessitated consideration that the patient might be at risk for infection with the SARS-CoV-2 virus that causes COVID-19. Institutional protocols and algorithms that pertain to the evaluation of patients at risk for COVID-19 are in a state of rapid change based on information released by regulatory bodies including the CDC and federal and state organizations. These policies and algorithms were followed during the patient's care in the ED.  Final Clinical Impression(s) / ED Diagnoses Final diagnoses:  Suspected COVID-19 virus infection    Rx / DC Orders ED Discharge Orders    None       Suzy Bouchard, PA-C 06/09/19 0142    Malvin Johns, MD 06/09/19 2326

## 2019-06-08 NOTE — ED Triage Notes (Signed)
Mother here with daughter.  Needs covid test per EDP.

## 2019-06-10 LAB — NOVEL CORONAVIRUS, NAA (HOSP ORDER, SEND-OUT TO REF LAB; TAT 18-24 HRS): SARS-CoV-2, NAA: DETECTED — AB

## 2019-09-03 ENCOUNTER — Emergency Department (HOSPITAL_BASED_OUTPATIENT_CLINIC_OR_DEPARTMENT_OTHER)
Admission: EM | Admit: 2019-09-03 | Discharge: 2019-09-04 | Disposition: A | Discharge status: Home / Self Care | Payer: Medicaid Other | Attending: Emergency Medicine | Admitting: Emergency Medicine

## 2019-09-03 ENCOUNTER — Emergency Department (HOSPITAL_BASED_OUTPATIENT_CLINIC_OR_DEPARTMENT_OTHER): Payer: Medicaid Other

## 2019-09-03 ENCOUNTER — Encounter (HOSPITAL_BASED_OUTPATIENT_CLINIC_OR_DEPARTMENT_OTHER): Payer: Self-pay | Admitting: Emergency Medicine

## 2019-09-03 ENCOUNTER — Other Ambulatory Visit: Payer: Self-pay

## 2019-09-03 DIAGNOSIS — I10 Essential (primary) hypertension: Secondary | ICD-10-CM | POA: Insufficient documentation

## 2019-09-03 DIAGNOSIS — I2699 Other pulmonary embolism without acute cor pulmonale: Secondary | ICD-10-CM | POA: Insufficient documentation

## 2019-09-03 DIAGNOSIS — E876 Hypokalemia: Secondary | ICD-10-CM | POA: Diagnosis not present

## 2019-09-03 DIAGNOSIS — Z79899 Other long term (current) drug therapy: Secondary | ICD-10-CM | POA: Insufficient documentation

## 2019-09-03 DIAGNOSIS — R079 Chest pain, unspecified: Secondary | ICD-10-CM

## 2019-09-03 LAB — CBC
HCT: 40.4 % (ref 36.0–46.0)
Hemoglobin: 13.3 g/dL (ref 12.0–15.0)
MCH: 30.3 pg (ref 26.0–34.0)
MCHC: 32.9 g/dL (ref 30.0–36.0)
MCV: 92 fL (ref 80.0–100.0)
Platelets: 264 10*3/uL (ref 150–400)
RBC: 4.39 MIL/uL (ref 3.87–5.11)
RDW: 12.8 % (ref 11.5–15.5)
WBC: 6.5 10*3/uL (ref 4.0–10.5)
nRBC: 0 % (ref 0.0–0.2)

## 2019-09-03 LAB — BASIC METABOLIC PANEL
Anion gap: 10 (ref 5–15)
BUN: 12 mg/dL (ref 6–20)
CO2: 27 mmol/L (ref 22–32)
Calcium: 9 mg/dL (ref 8.9–10.3)
Chloride: 98 mmol/L (ref 98–111)
Creatinine, Ser: 0.8 mg/dL (ref 0.44–1.00)
GFR calc Af Amer: >60 mL/min (ref >60)
GFR calc non Af Amer: >60 mL/min (ref >60)
Glucose, Bld: 89 mg/dL (ref 70–99)
Potassium: 2.7 mmol/L — CL (ref 3.5–5.1)
Sodium: 135 mmol/L (ref 135–145)

## 2019-09-03 LAB — D-DIMER, QUANTITATIVE (NOT AT ARMC): D-Dimer, Quant: 1.3 ug/mL-FEU — ABNORMAL HIGH (ref 0.00–0.50)

## 2019-09-03 LAB — PREGNANCY, URINE: Preg Test, Ur: NEGATIVE

## 2019-09-03 LAB — MAGNESIUM: Magnesium: 1.5 mg/dL — ABNORMAL LOW (ref 1.7–2.4)

## 2019-09-03 LAB — TROPONIN I (HIGH SENSITIVITY): Troponin I (High Sensitivity): 6 ng/L (ref <18)

## 2019-09-03 MED ORDER — SODIUM CHLORIDE 0.9 % IV BOLUS (SEPSIS)
1000.0000 mL | Freq: Once | INTRAVENOUS | Status: AC
  Administered 2019-09-04: 1000 mL via INTRAVENOUS

## 2019-09-03 MED ORDER — POTASSIUM CHLORIDE CRYS ER 20 MEQ PO TBCR
40.0000 meq | EXTENDED_RELEASE_TABLET | Freq: Once | ORAL | Status: AC
  Administered 2019-09-04: 01:00:00 40 meq via ORAL
  Filled 2019-09-03: qty 2

## 2019-09-03 MED ORDER — MAGNESIUM SULFATE 2 GM/50ML IV SOLN
2.0000 g | Freq: Once | INTRAVENOUS | Status: AC
  Administered 2019-09-04: 01:00:00 2 g via INTRAVENOUS
  Filled 2019-09-03: qty 50

## 2019-09-03 MED ORDER — IOHEXOL 350 MG/ML SOLN
100.0000 mL | Freq: Once | INTRAVENOUS | Status: AC | PRN
  Administered 2019-09-04: 100 mL via INTRAVENOUS

## 2019-09-03 MED ORDER — POTASSIUM CHLORIDE 10 MEQ/100ML IV SOLN
10.0000 meq | INTRAVENOUS | Status: AC
  Administered 2019-09-04 (×2): 10 meq via INTRAVENOUS
  Filled 2019-09-03 (×2): qty 100

## 2019-09-03 MED ORDER — MORPHINE SULFATE (PF) 4 MG/ML IV SOLN
4.0000 mg | Freq: Once | INTRAVENOUS | Status: AC
  Administered 2019-09-03: 23:00:00 4 mg via INTRAVENOUS
  Filled 2019-09-03: qty 1

## 2019-09-03 MED ORDER — SODIUM CHLORIDE 0.9% FLUSH
3.0000 mL | Freq: Once | INTRAVENOUS | Status: DC
  Filled 2019-09-03: qty 3

## 2019-09-03 NOTE — ED Notes (Signed)
Lab called a potassium of 2.7. Dr. Elesa Massed and primary nurse aware.

## 2019-09-03 NOTE — ED Triage Notes (Signed)
Pt states that she started to have chest pain earlier this morning. About 1 -2 hours ago it worsened. It started in her chest and radiates to her right shoulder and arm as well as through to her back

## 2019-09-03 NOTE — ED Provider Notes (Signed)
TIME SEEN: 11:02 PM  CHIEF COMPLAINT: Chest pain  HPI: Patient is a 44 year old female with history of hypertension who presents to the emergency department with chest pain that started Friday evening.  She states it started off as mild and progressively got worse.  She reports over the last 3 hours it has been increasingly painful.  She describes as a pressure, tightness that goes throughout her anterior chest and into her back.  She has associated shortness of breath.  No nausea, vomiting, diaphoresis or dizziness.  No history of diabetes, hyperlipidemia.  She was a smoker years ago but has quit.  No family history of premature CAD.  No history of PE, DVT, exogenous estrogen use, recent fractures, trauma, or prolonged travel. No lower extremity swelling or pain. No calf tenderness.  Patient reports she did just have a child in December 2020 by C-section.  She states pain is worse with deep inspiration.  No alleviating factors.  Denies fever or cough.  ROS: See HPI Constitutional: no fever  Eyes: no drainage  ENT: no runny nose   Cardiovascular:   chest pain  Resp:  SOB  GI: no vomiting GU: no dysuria Integumentary: no rash  Allergy: no hives  Musculoskeletal: no leg swelling  Neurological: no slurred speech ROS otherwise negative  PAST MEDICAL HISTORY/PAST SURGICAL HISTORY:  Past Medical History:  Diagnosis Date  . Hypertension     MEDICATIONS:  Prior to Admission medications   Medication Sig Start Date End Date Taking? Authorizing Provider  NIFEdipine (PROCARDIA-XL/NIFEDICAL-XL) 30 MG 24 hr tablet TAKE 1 TABLET(30 MG) BY MOUTH DAILY 08/17/19  Yes [provider]  amLODipine (NORVASC) 5 MG tablet Take 5 mg by mouth every morning. 09/16/17   [provider]  Aspirin-Acetaminophen-Caffeine (GOODY HEADACHE PO) Take 1 packet by mouth daily as needed.    [provider]  hydrochlorothiazide (MICROZIDE) 12.5 MG capsule Take 12.5 mg by mouth daily.    [provider]  Linaclotide Karlene Einstein) 145 MCG CAPS capsule Take 145 mcg by mouth daily.    [provider]  loratadine (CLARITIN) 10 MG tablet Take 10 mg by mouth daily.    [provider]  metoCLOPramide (REGLAN) 10 MG tablet Take 1 tablet (10 mg total) by mouth every 6 (six) hours. 04/03/18   Cristina Gong, PA-C  omeprazole (PRILOSEC) 20 MG capsule Take 20 mg by mouth every morning.    [provider]  ondansetron (ZOFRAN ODT) 4 MG disintegrating tablet Take 1 tablet (4 mg total) by mouth every 8 (eight) hours as needed for nausea or vomiting. 04/03/18   Cristina Gong, PA-C  pantoprazole (PROTONIX) 20 MG tablet Take 1 tablet (20 mg total) by mouth daily. 08/21/16   Arby Barrette, MD  potassium chloride SA (K-DUR,KLOR-CON) 20 MEQ tablet Take 2 tablets (40 mEq total) by mouth daily for 7 days. 04/03/18 04/10/18  Cristina Gong, PA-C    ALLERGIES:  No Known Allergies  SOCIAL HISTORY:  Social History   Tobacco Use  . Smoking status: Never Smoker  . Smokeless tobacco: Never Used  Substance Use Topics  . Alcohol use: Yes    Comment: occasional     FAMILY HISTORY: History reviewed. No pertinent family history.  EXAM: BP (!) 181/114 (BP Location: Left Arm)   Pulse (!) 58   Temp 98 F (36.7 C) (Oral)   Resp 18   Ht 5\' 2"  (1.575 m)   Wt 70.3 kg   LMP 08/25/2019   SpO2  100%   BMI 28.35 kg/m  CONSTITUTIONAL: Alert and oriented and responds appropriately to questions.  Appears slightly uncomfortable but not in distress.  Nontoxic.  Afebrile. HEAD: Normocephalic EYES: Conjunctivae clear, pupils appear equal, EOM appear intact ENT: normal nose; moist mucous membranes NECK: Supple, normal ROM CARD: RRR; S1 and S2 appreciated; no murmurs, no clicks, no rubs, no gallops RESP: Normal chest excursion without splinting or tachypnea; breath sounds clear and equal bilaterally; no wheezes, no rhonchi, no rales, no hypoxia or respiratory distress,  speaking full sentences ABD/GI: Normal bowel sounds; non-distended; soft, non-tender, no rebound, no guarding, no peritoneal signs, no hepatosplenomegaly BACK:  The back appears normal EXT: Normal ROM in all joints; no deformity noted, no edema; no cyanosis, no calf tenderness or calf swelling SKIN: Normal color for age and race; warm; no rash on exposed skin NEURO: Moves all extremities equally PSYCH: The patient's mood and manner are appropriate.   MEDICAL DECISION MAKING: Patient here with chest pain.  She does have history of previous tobacco use and hypertension but no other significant risk factor for ACS.  She does have some nonspecific ST abnormalities but does appear similar to previous EKG on my interpretation.  She does have a potassium level today of 2.7 which appears to be something she struggles with.  Will give oral and IV potassium as well as check a magnesium level.  Her troponin is negative.  Second troponin pending.  We will add on D-dimer given recent birth of a child and complaints of pleuritic chest pain.  She received morphine in the ED and reports pain has improved.  Declines any further medicine at this time.  Chest x-ray pending.  No infectious symptoms to suggest pneumonia.  No sign of volume overload.  No history of CHF.  Doubt dissection.  ED PROGRESS: Magnesium level is 1.5.  Repeat EKG has been reviewed by myself and shows slightly prolonged QT interval.  Replace magnesium and potassium here today.  No new ischemic changes.  D-dimer elevated.  Will proceed with CTA of the chest to evaluate for PE.  Second troponin pending.    I have independently reviewed and interpreted patient's chest x-ray and CT scan.  Chest x-ray shows no cardiomegaly, widened mediastinum, pneumothorax, infiltrate or edema.  CTA of the chest does show nonocclusive right upper lobe PE as well as a left lower lobe PE.   Patient reports improvement in pain with nitroglycerin but still a 4/10.  Now  having significant headache.  Second troponin is flat at 6.  Discussed findings with patient and husband at bedside.  Patient would prefer discharge home given she has a 22-month-old child at home.   No sign of right heart strain seen on CT scan.  No lower extremity swelling or pain.  No active bleeding currently.  Will start Xarelto here.  Her PESI score is a 46 and we have discussed that she is very low risk for 30-day mortality and I feel it would be reasonable to be discharged home with very close outpatient follow-up with her PCP for outpatient echocardiogram and venous Dopplers.  Will ambulate with pulse oximeter prior to discharge.  Low suspicion for ACS given 2 negative troponins here.  Patient's risk score is a 4 using the heart pathway.  2:30 AM Pt's pain has completely resolved.  Discussed possibilities of admission versus discharge home with close outpatient follow-up for continued work-up.  She would prefer discharge home which I feel is very reasonable especially given she  continues to be hemodynamically stable and is now pain-free and has had 2 negative and flat high-sensitivity troponins.  Xarelto has been started here and we will schedule her to come back later today to have bilateral venous Dopplers.  She feels comfortable with close follow-up with her PCP and feels like he will be able to get her in soon.  Have recommended outpatient echocardiogram.  Plan will be to discharge home with some pain medication as well.  She is not currently breast-feeding.  She has not had any documented tachycardia, tachypnea, hypoxia or hypotension here.  We will also plan to discharge home with potassium prescription.   3:15 Pt's repeat EKG shows improvement of her QT interval.  No new ischemic changes.  She has been able to ambulate and sats stay 97% or greater and heart rate is in the 80s.  No shortness of breath or chest pain with walking.  She has been scheduled for bilateral venous Dopplers.  Will  discharge with prescription of Xarelto, pain medication and potassium.  Recommended close follow-up with her PCP.  Discussed return precautions.  She is comfortable with plan as is her husband.  At this time, I do not feel there is any life-threatening condition present. I have reviewed, interpreted and discussed all results (EKG, imaging, lab, urine as appropriate) and exam findings with patient/family. I have reviewed nursing notes and appropriate previous records.  I feel the patient is safe to be discharged home without further emergent workup and can continue workup as an outpatient as needed. Discussed usual and customary return precautions. Patient/family verbalize understanding and are comfortable with this plan.  Outpatient follow-up has been provided as needed. All questions have been answered.    EKG Interpretation  Date/Time:  Saturday September 03 2019 22:10:14 EDT Ventricular Rate:  63 PR Interval:  132 QRS Duration: 82 QT Interval:  416 QTC Calculation: 425 R Axis:   80 Text Interpretation: Normal sinus rhythm ST & T wave abnormality, consider inferior ischemia Abnormal ECG new possible ischemia compared with prior 11/19 Confirmed by Aletta Edouard [phone]) on 09/03/2019 10:43:45 PM        EKG Interpretation  Date/Time:  Saturday September 03 2019 23:45:09 EDT Ventricular Rate:  55 PR Interval:  132 QRS Duration: 106 QT Interval:  509 QTC Calculation: 487 R Axis:   72 Text Interpretation: Sinus rhythm Borderline prolonged QT interval Scooped ST segements seem similar to previous Confirmed by Pryor Curia [phone]) on 09/03/2019 11:49:07 PM       EKG Interpretation  Date/Time:  Sunday September 04 2019 02:38:42 EDT Ventricular Rate:  73 PR Interval:  132 QRS Duration: 87 QT Interval:  456 QTC Calculation: 503 R Axis:   76 Text Interpretation: Sinus rhythm Low voltage, precordial leads Borderline repolarization abnormality Borderline prolonged QT interval No significant change  since last tracing Confirmed by Pryor Curia [phone]) on 09/04/2019 2:46:09 AM       EKG Interpretation  Date/Time:  Sunday September 04 2019 02:50:09 EDT Ventricular Rate:  67 PR Interval:  132 QRS Duration: 94 QT Interval:  455 QTC Calculation: 481 R Axis:   87 Text Interpretation: Sinus rhythm Low voltage, precordial leads Minimal ST depression QT interval has improved Confirmed by Pryor Curia [phone]) on 09/04/2019 2:53:38 AM        CRITICAL CARE Performed by: Cyril Mourning Dejay Kronk   Total critical care time: 65 minutes  Critical care time was exclusive of separately billable procedures and treating other patients.  Critical care was necessary  to treat or prevent imminent or life-threatening deterioration.  Critical care was time spent personally by me on the following activities: development of treatment plan with patient and/or surrogate as well as nursing, discussions with consultants, evaluation of patient's response to treatment, examination of patient, obtaining history from patient or surrogate, ordering and performing treatments and interventions, ordering and review of laboratory studies, ordering and review of radiographic studies, pulse oximetry and re-evaluation of patient's condition.   Jennifer Mcgrath was evaluated in Emergency Department on 09/03/2019 for the symptoms described in the history of present illness. She was evaluated in the context of the global COVID-19 pandemic, which necessitated consideration that the patient might be at risk for infection with the SARS-CoV-2 virus that causes COVID-19. Institutional protocols and algorithms that pertain to the evaluation of patients at risk for COVID-19 are in a state of rapid change based on information released by regulatory bodies including the CDC and federal and state organizations. These policies and algorithms were followed during the patient's care in the ED.      Aideliz Garmany, Layla Maw, DO 09/04/19 [phone]

## 2019-09-04 ENCOUNTER — Ambulatory Visit (HOSPITAL_BASED_OUTPATIENT_CLINIC_OR_DEPARTMENT_OTHER)
Admit: 2019-09-04 | Discharge: 2019-09-04 | Disposition: A | Discharge status: Home / Self Care | Payer: Medicaid Other | Attending: Emergency Medicine | Admitting: Emergency Medicine

## 2019-09-04 ENCOUNTER — Telehealth (HOSPITAL_BASED_OUTPATIENT_CLINIC_OR_DEPARTMENT_OTHER): Payer: Self-pay | Admitting: Emergency Medicine

## 2019-09-04 LAB — TROPONIN I (HIGH SENSITIVITY): Troponin I (High Sensitivity): 6 ng/L (ref <18)

## 2019-09-04 MED ORDER — ASPIRIN 81 MG PO CHEW
324.0000 mg | CHEWABLE_TABLET | Freq: Once | ORAL | Status: AC
  Administered 2019-09-04: 324 mg via ORAL
  Filled 2019-09-04: qty 4

## 2019-09-04 MED ORDER — OXYCODONE-ACETAMINOPHEN 5-325 MG PO TABS: 1.0000 | ORAL_TABLET | ORAL | 0 refills | Status: DC | PRN

## 2019-09-04 MED ORDER — RIVAROXABAN (XARELTO) VTE STARTER PACK (15 & 20 MG): ORAL_TABLET | ORAL | 0 refills | Status: DC

## 2019-09-04 MED ORDER — POTASSIUM CHLORIDE CRYS ER 20 MEQ PO TBCR: 40.0000 meq | EXTENDED_RELEASE_TABLET | Freq: Every day | ORAL | 0 refills | Status: DC

## 2019-09-04 MED ORDER — ACETAMINOPHEN 500 MG PO TABS
1000.0000 mg | ORAL_TABLET | Freq: Once | ORAL | Status: AC
  Administered 2019-09-04: 1000 mg via ORAL
  Filled 2019-09-04: qty 2

## 2019-09-04 MED ORDER — RIVAROXABAN 15 MG PO TABS
15.0000 mg | ORAL_TABLET | Freq: Once | ORAL | Status: AC
  Administered 2019-09-04: 15 mg via ORAL
  Filled 2019-09-04: qty 1

## 2019-09-04 MED ORDER — NITROGLYCERIN 0.4 MG SL SUBL
0.4000 mg | SUBLINGUAL_TABLET | SUBLINGUAL | Status: DC | PRN
  Administered 2019-09-04 (×2): 0.4 mg via SUBLINGUAL
  Filled 2019-09-04: qty 1

## 2019-09-04 NOTE — ED Notes (Signed)
Pt ambulated with stand by assistance. O2 sats 97-99 the whole time. Heart rate 86-90.

## 2019-09-04 NOTE — ED Notes (Signed)
Pt to CT

## 2019-09-04 NOTE — ED Notes (Signed)
Outpatient doppler scheduled with patient.

## 2019-09-04 NOTE — Telephone Encounter (Signed)
Patient had DVT study performed, which was negative.  Informed patient of results.  She was able to fill xarelto prescription. Discussed home pain control, continuing Xarelto.

## 2019-09-04 NOTE — Discharge Instructions (Addendum)
Your potassium level was slightly low today at 2.7.  Please continue potassium as prescribed and follow-up with your PCP to have this rechecked.  Your magnesium level was also slightly low at 1.5.  This was also replaced in the emergency department.  Please discuss with your doctor if you should resume daily potassium tablets.  Your cardiac labs and chest x-ray today were normal.  I recommend close follow-up with your primary care doctor as you do have some risk factors for heart disease.  Your CT scan did show small blood clots in the right upper and left lower lobe.  Your mortality risk is low and we have discussed admission versus discharge.  We have decided together on discharge home on blood thinners.  He will take 15 mg twice a day for 3 weeks and then transition to 20 mg at dinnertime once a day.  We have scheduled you for ultrasound of both of your legs.  Please keep this appointment.  I recommend close follow-up with your primary care physician who may recommend an echocardiogram (ultrasound of your heart) as an outpatient.  If you develop any worsening chest pain, shortness of breath, feel like you are going to pass out or do pass out, please return to the emergency department.  You are being provided a prescription for opiates (also known as narcotics) for pain control.  Opiates can be addictive and should only be used when absolutely necessary for pain control when other alternatives do not work.  We recommend you only use them for the recommended amount of time and only as prescribed.  Please do not take with other sedative medications or alcohol.  Please do not drive, operate machinery, make important decisions while taking opiates.  Please note that these medications can be addictive and have high abuse potential.  Patients can become addicted to narcotics after only taking them for a few days.  Please keep these medications locked away from children, teenagers or any family members with  history of substance abuse.  Narcotic pain medicine may also make you constipated.  You may use over-the-counter medications such as MiraLAX, Colace to prevent constipation.  If you become constipated you may use over-the-counter enemas as needed.  Itching and nausea are common side effects of narcotic pain medication.  If you develop uncontrolled vomiting or a rash, please stop these medications.

## 2019-11-14 ENCOUNTER — Encounter (HOSPITAL_BASED_OUTPATIENT_CLINIC_OR_DEPARTMENT_OTHER): Payer: Self-pay

## 2019-11-14 ENCOUNTER — Other Ambulatory Visit: Payer: Self-pay

## 2019-11-14 ENCOUNTER — Emergency Department (HOSPITAL_BASED_OUTPATIENT_CLINIC_OR_DEPARTMENT_OTHER)
Admission: EM | Admit: 2019-11-14 | Discharge: 2019-11-15 | Disposition: A | Discharge status: Home / Self Care | Payer: Medicaid Other | Attending: Emergency Medicine | Admitting: Emergency Medicine

## 2019-11-14 DIAGNOSIS — I10 Essential (primary) hypertension: Secondary | ICD-10-CM | POA: Diagnosis not present

## 2019-11-14 DIAGNOSIS — Z79899 Other long term (current) drug therapy: Secondary | ICD-10-CM | POA: Diagnosis not present

## 2019-11-14 DIAGNOSIS — K625 Hemorrhage of anus and rectum: Secondary | ICD-10-CM | POA: Insufficient documentation

## 2019-11-14 DIAGNOSIS — Z7982 Long term (current) use of aspirin: Secondary | ICD-10-CM | POA: Insufficient documentation

## 2019-11-14 LAB — CBC
HCT: 35.3 % — ABNORMAL LOW (ref 36.0–46.0)
HCT: 36.4 % (ref 36.0–46.0)
Hemoglobin: 11.5 g/dL — ABNORMAL LOW (ref 12.0–15.0)
Hemoglobin: 11.7 g/dL — ABNORMAL LOW (ref 12.0–15.0)
MCH: 29.4 pg (ref 26.0–34.0)
MCH: 29.3 pg (ref 26.0–34.0)
MCHC: 32.6 g/dL (ref 30.0–36.0)
MCHC: 32.1 g/dL (ref 30.0–36.0)
MCV: 90.3 fL (ref 80.0–100.0)
MCV: 91 fL (ref 80.0–100.0)
Platelets: 355 10*3/uL (ref 150–400)
Platelets: 348 10*3/uL (ref 150–400)
RBC: 3.91 MIL/uL (ref 3.87–5.11)
RBC: 4 MIL/uL (ref 3.87–5.11)
RDW: 12.7 % (ref 11.5–15.5)
RDW: 12.6 % (ref 11.5–15.5)
WBC: 5.9 10*3/uL (ref 4.0–10.5)
WBC: 7.2 10*3/uL (ref 4.0–10.5)
nRBC: 0 % (ref 0.0–0.2)
nRBC: 0 % (ref 0.0–0.2)

## 2019-11-14 LAB — COMPREHENSIVE METABOLIC PANEL
ALT: 15 U/L (ref 0–44)
AST: 23 U/L (ref 15–41)
Albumin: 3.7 g/dL (ref 3.5–5.0)
Alkaline Phosphatase: 53 U/L (ref 38–126)
Anion gap: 11 (ref 5–15)
BUN: 17 mg/dL (ref 6–20)
CO2: 29 mmol/L (ref 22–32)
Calcium: 8.8 mg/dL — ABNORMAL LOW (ref 8.9–10.3)
Chloride: 96 mmol/L — ABNORMAL LOW (ref 98–111)
Creatinine, Ser: 0.78 mg/dL (ref 0.44–1.00)
GFR calc Af Amer: >60 mL/min (ref >60)
GFR calc non Af Amer: >60 mL/min (ref >60)
Glucose, Bld: 86 mg/dL (ref 70–99)
Potassium: 2.9 mmol/L — ABNORMAL LOW (ref 3.5–5.1)
Sodium: 136 mmol/L (ref 135–145)
Total Bilirubin: 0.4 mg/dL (ref 0.3–1.2)
Total Protein: 8.1 g/dL (ref 6.5–8.1)

## 2019-11-14 LAB — PREGNANCY, URINE: Preg Test, Ur: NEGATIVE

## 2019-11-14 MED ORDER — IOHEXOL 300 MG/ML  SOLN: 100.0000 mL | Freq: Once | INTRAMUSCULAR | Status: DC

## 2019-11-14 NOTE — ED Triage Notes (Signed)
Pt having bleeding from rectum and abd pain that started today.

## 2019-11-14 NOTE — Discharge Instructions (Signed)
Watch for more bleeding.  Watch for lightheadedness or dizziness.  Follow-up with gastroenterology to make sure this was just traumatic bleeding.

## 2019-11-14 NOTE — ED Provider Notes (Signed)
[facility] EMERGENCY DEPARTMENT Provider Note   CSN: [id number] Arrival date & time: 11/14/19  1947     History Chief Complaint  Patient presents with  . Rectal Bleeding    Jennifer Mcgrath is a 44 y.o. female.  HPI Patient presents with rectal bleeding. States she had severe lower abdominal pain. Had not had a bowel movement in around a week which is not that unusual for her. States that after she began to try and take out the stool herself she began to have some rectal bleeding. States blood was coming out. She is on anticoagulation for pulmonary embolism. States that after she arrived here she did have a large bowel movement and abdomen feels much better. Pain had been in the lower abdomen. Had not really had rectal pain with it. States there was some blood in with the stool but less blood when she wiped. States she feels much better now that she has had a bowel movement. No lightheadedness or dizziness.    Past Medical History:  Diagnosis Date  . Hypertension     There are no problems to display for this patient.   Past Surgical History:  Procedure Laterality Date  . CESAREAN SECTION  1992, 1999, 2003, 2008     OB History   No obstetric history on file.     No family history on file.  Social History   Tobacco Use  . Smoking status: Never Smoker  . Smokeless tobacco: Never Used  Vaping Use  . Vaping Use: Never used  Substance Use Topics  . Alcohol use: Yes    Comment: occasional   . Drug use: No    Home Medications Prior to Admission medications   Medication Sig Start Date End Date Taking? Authorizing Provider  amLODipine (NORVASC) 5 MG tablet Take 5 mg by mouth every morning. 09/16/17   [provider]  Aspirin-Acetaminophen-Caffeine (GOODY HEADACHE PO) Take 1 packet by mouth daily as needed.    [provider]  hydrochlorothiazide (MICROZIDE) 12.5 MG capsule Take 12.5 mg by mouth daily.    [provider]  Linaclotide  Karlene Einstein) 145 MCG CAPS capsule Take 145 mcg by mouth daily.    [provider]  loratadine (CLARITIN) 10 MG tablet Take 10 mg by mouth daily.    [provider]  metoCLOPramide (REGLAN) 10 MG tablet Take 1 tablet (10 mg total) by mouth every 6 (six) hours. 04/03/18   Cristina Gong, PA-C  NIFEdipine (PROCARDIA-XL/NIFEDICAL-XL) 30 MG 24 hr tablet TAKE 1 TABLET(30 MG) BY MOUTH DAILY 08/17/19   [provider]  omeprazole (PRILOSEC) 20 MG capsule Take 20 mg by mouth every morning.    [provider]  ondansetron (ZOFRAN ODT) 4 MG disintegrating tablet Take 1 tablet (4 mg total) by mouth every 8 (eight) hours as needed for nausea or vomiting. 04/03/18   Cristina Gong, PA-C  oxyCODONE-acetaminophen (PERCOCET/ROXICET) 5-325 MG tablet Take 1 tablet by mouth every 4 (four) hours as needed. 09/04/19   Ward, Layla Maw, DO  pantoprazole (PROTONIX) 20 MG tablet Take 1 tablet (20 mg total) by mouth daily. 08/21/16   Arby Barrette, MD  potassium chloride SA (KLOR-CON) 20 MEQ tablet Take 2 tablets (40 mEq total) by mouth daily. 09/04/19   Ward, Layla Maw, DO  Rivaroxaban 15 & 20 MG TBPK Follow package directions: Take one 15mg  tablet by mouth twice a day. On day 22, switch to one 20mg  tablet once a day. Take with food. 09/04/19  Ward, Layla Maw, DO    Allergies    Patient has no known allergies.  Review of Systems   Review of Systems  Constitutional: Negative for appetite change.  HENT: Negative for congestion.   Respiratory: Negative for shortness of breath.   Gastrointestinal: Positive for abdominal pain, anal bleeding, blood in stool and constipation. Negative for rectal pain.  Genitourinary: Negative for flank pain.  Musculoskeletal: Negative for back pain.  Skin: Negative for rash.  Neurological: Negative for weakness.  Psychiatric/Behavioral: Negative for confusion.    Physical Exam Updated Vital Signs BP 140/83 (BP Location: Right Arm)   Pulse 89    Temp 98.7 F (37.1 C) (Oral)   Resp 19   Ht 5\' 2"  (1.575 m)   Wt 76.7 kg   LMP 10/12/2019   SpO2 100%   BMI 30.91 kg/m   Physical Exam Vitals and nursing note reviewed.  HENT:     Head: Normocephalic.  Eyes:     Extraocular Movements: Extraocular movements intact.  Cardiovascular:     Rate and Rhythm: Normal rate and regular rhythm.  Pulmonary:     Effort: Pulmonary effort is normal.     Breath sounds: Normal breath sounds.  Abdominal:     Tenderness: There is no abdominal tenderness.  Genitourinary:    Comments: Mild anterior rectal tenderness. No external bleeding seen. Did have some mild amount of blood on finger. No stool palpated. Musculoskeletal:     Cervical back: Neck supple.  Skin:    General: Skin is dry.     Capillary Refill: Capillary refill takes less than 2 seconds.     Coloration: Skin is not pale.  Neurological:     Mental Status: She is alert and oriented to person, place, and time.     ED Results / Procedures / Treatments   Labs (all labs ordered are listed, but only abnormal results are displayed) Labs Reviewed  COMPREHENSIVE METABOLIC PANEL - Abnormal; Notable for the following components:      Result Value   Potassium 2.9 (*)    Chloride 96 (*)    Calcium 8.8 (*)    All other components within normal limits  CBC - Abnormal; Notable for the following components:   Hemoglobin 11.5 (*)    HCT 35.3 (*)    All other components within normal limits  CBC - Abnormal; Notable for the following components:   Hemoglobin 11.7 (*)    All other components within normal limits  PREGNANCY, URINE    EKG None  Radiology No results found.  Procedures Procedures (including critical care time)  Medications Ordered in ED Medications - No data to display  ED Course  I have reviewed the triage vital signs and the nursing notes.  Pertinent labs & imaging results that were available during my care of the patient were reviewed by me and considered in  my medical decision making (see chart for details).    MDM Rules/Calculators/A&P                          Patient with rectal bleeding.  Has decreased with subsequent bowel movements.  Hemoglobin stable with a 2-hour delta.  She is on anticoagulation however.  Began after she tried to take the stool out of her own rectum.  I think more likely this is a mechanical bleed.  Also had been constipated.  However I think it is reasonable to follow-up to make sure this was all  traumatic.  Discharge home. Final Clinical Impression(s) / ED Diagnoses Final diagnoses:  Rectal bleeding    Rx / DC Orders ED Discharge Orders    None       Davonna Belling, MD 11/14/19 2320

## 2020-01-15 ENCOUNTER — Emergency Department (HOSPITAL_BASED_OUTPATIENT_CLINIC_OR_DEPARTMENT_OTHER): Payer: Medicaid Other

## 2020-01-15 ENCOUNTER — Encounter (HOSPITAL_BASED_OUTPATIENT_CLINIC_OR_DEPARTMENT_OTHER): Payer: Self-pay | Admitting: Emergency Medicine

## 2020-01-15 ENCOUNTER — Other Ambulatory Visit: Payer: Self-pay

## 2020-01-15 ENCOUNTER — Emergency Department (HOSPITAL_BASED_OUTPATIENT_CLINIC_OR_DEPARTMENT_OTHER)
Admission: EM | Admit: 2020-01-15 | Discharge: 2020-01-15 | Disposition: A | Discharge status: Home / Self Care | Payer: Medicaid Other | Attending: Emergency Medicine | Admitting: Emergency Medicine

## 2020-01-15 DIAGNOSIS — R05 Cough: Secondary | ICD-10-CM | POA: Diagnosis not present

## 2020-01-15 DIAGNOSIS — R0602 Shortness of breath: Secondary | ICD-10-CM | POA: Diagnosis not present

## 2020-01-15 DIAGNOSIS — J029 Acute pharyngitis, unspecified: Secondary | ICD-10-CM | POA: Insufficient documentation

## 2020-01-15 DIAGNOSIS — I10 Essential (primary) hypertension: Secondary | ICD-10-CM | POA: Diagnosis not present

## 2020-01-15 DIAGNOSIS — R0789 Other chest pain: Secondary | ICD-10-CM | POA: Diagnosis not present

## 2020-01-15 DIAGNOSIS — Z20822 Contact with and (suspected) exposure to covid-19: Secondary | ICD-10-CM | POA: Insufficient documentation

## 2020-01-15 DIAGNOSIS — Z7901 Long term (current) use of anticoagulants: Secondary | ICD-10-CM | POA: Diagnosis not present

## 2020-01-15 DIAGNOSIS — R079 Chest pain, unspecified: Secondary | ICD-10-CM | POA: Diagnosis present

## 2020-01-15 LAB — SARS CORONAVIRUS 2 BY RT PCR (HOSPITAL ORDER, PERFORMED IN ~~LOC~~ HOSPITAL LAB): SARS Coronavirus 2: NEGATIVE

## 2020-01-15 LAB — BASIC METABOLIC PANEL
Anion gap: 9 (ref 5–15)
BUN: 10 mg/dL (ref 6–20)
CO2: 27 mmol/L (ref 22–32)
Calcium: 8.7 mg/dL — ABNORMAL LOW (ref 8.9–10.3)
Chloride: 100 mmol/L (ref 98–111)
Creatinine, Ser: 0.76 mg/dL (ref 0.44–1.00)
GFR calc Af Amer: >60 mL/min (ref >60)
GFR calc non Af Amer: >60 mL/min (ref >60)
Glucose, Bld: 95 mg/dL (ref 70–99)
Potassium: 3 mmol/L — ABNORMAL LOW (ref 3.5–5.1)
Sodium: 136 mmol/L (ref 135–145)

## 2020-01-15 LAB — CBC
HCT: 37.3 % (ref 36.0–46.0)
Hemoglobin: 12 g/dL (ref 12.0–15.0)
MCH: 28.6 pg (ref 26.0–34.0)
MCHC: 32.2 g/dL (ref 30.0–36.0)
MCV: 89 fL (ref 80.0–100.0)
Platelets: 319 10*3/uL (ref 150–400)
RBC: 4.19 MIL/uL (ref 3.87–5.11)
RDW: 13.4 % (ref 11.5–15.5)
WBC: 5.4 10*3/uL (ref 4.0–10.5)
nRBC: 0 % (ref 0.0–0.2)

## 2020-01-15 LAB — TROPONIN I (HIGH SENSITIVITY)
Troponin I (High Sensitivity): 3 ng/L (ref <18)
Troponin I (High Sensitivity): 4 ng/L (ref <18)

## 2020-01-15 LAB — PREGNANCY, URINE: Preg Test, Ur: NEGATIVE

## 2020-01-15 MED ORDER — MORPHINE SULFATE (PF) 4 MG/ML IV SOLN
4.0000 mg | Freq: Once | INTRAVENOUS | Status: AC
  Administered 2020-01-15: 4 mg via INTRAVENOUS
  Filled 2020-01-15: qty 1

## 2020-01-15 MED ORDER — SODIUM CHLORIDE 0.9 % IV BOLUS
1000.0000 mL | Freq: Once | INTRAVENOUS | Status: AC
  Administered 2020-01-15: 1000 mL via INTRAVENOUS

## 2020-01-15 MED ORDER — IOHEXOL 350 MG/ML SOLN
100.0000 mL | Freq: Once | INTRAVENOUS | Status: AC | PRN
  Administered 2020-01-15: 86 mL via INTRAVENOUS

## 2020-01-15 MED ORDER — POTASSIUM CHLORIDE CRYS ER 20 MEQ PO TBCR
40.0000 meq | EXTENDED_RELEASE_TABLET | Freq: Once | ORAL | Status: AC
  Administered 2020-01-15: 40 meq via ORAL
  Filled 2020-01-15: qty 2

## 2020-01-15 MED ORDER — ONDANSETRON HCL 4 MG/2ML IJ SOLN
4.0000 mg | Freq: Once | INTRAMUSCULAR | Status: AC
  Administered 2020-01-15: 4 mg via INTRAVENOUS
  Filled 2020-01-15: qty 2

## 2020-01-15 MED ORDER — NITROGLYCERIN 0.4 MG SL SUBL
0.4000 mg | SUBLINGUAL_TABLET | SUBLINGUAL | Status: DC | PRN
  Administered 2020-01-15: 0.4 mg via SUBLINGUAL
  Filled 2020-01-15: qty 1

## 2020-01-15 NOTE — ED Notes (Signed)
ED Provider at bedside. 

## 2020-01-15 NOTE — Discharge Instructions (Signed)
As we discussed, your work-up today was reassuring.  Your CT scan did not show any evidence of blood clots.  You still need to take your Xarelto as previously instructed.  As we discussed, please follow-up with your primary care doctor.  I have also provided you a referral to cardiology.  He can follow-up with them and you may see be seen in the hypertension clinic for further evaluation and management of high blood pressure.  Return to the Emergency Department immediately if you experiencing worsening chest pain, difficulty breathing, nausea/vomiting, get very sweaty, headache or any other worsening or concerning symptoms.

## 2020-01-15 NOTE — ED Provider Notes (Signed)
[facility] EMERGENCY DEPARTMENT Provider Note   CSN: [id number] Arrival date & time: 01/15/20  1217     History Chief Complaint  Patient presents with  . Chest Pain  . Shortness of Breath    Jennifer Mcgrath is a 44 y.o. female past medical history of hypertension, PE who presents for evaluation of chest pain shortness of breath began yesterday. She reports that yesterday while at work, she started having a heaviness on her chest. She states that this was associated with diaphoresis, shortness of breath. No nausea/vomiting. She states that yesterday, the symptoms were coming and going. This morning when she woke up at 6 AM, and states that the pain was still there and has been constant since then. She describes it as a heaviness across her chest, "like something is sitting on her chest." She states she is also had shortness of breath. She states that the shortness of breath is worse on exertion but the chest pain is not worse with exertion. She does report the chest pain is worse with deep inspiration. She reports she has not been sick recently with any fever, cough, congestion. She does report she has had some mild sore throat. She states her daughter recently had RSV. She has a history of bilateral PEs. She does not know what caused them but she states that they think it was because she was right after pregnancy. She was put on Xarelto. She does report that she had a month where she was not on the Xarelto and then her primary care doctor refilled it. She has been taking it for the last several weeks. She states that the symptoms feel similar to when she previously had blood clots in her legs. She does not smoke. She denies any cocaine use. She has high blood pressure but denies any history of diabetes. No personal cardiac history. No family history of MI. Denies any exogenous hormone use, recent travel, leg swelling, recent surgeries or admissions.  HPI  HPI: A 44 year old patient with a  history of hypertension presents for evaluation of chest pain. Initial onset of pain was more than 6 hours ago. The patient's chest pain is described as heaviness/pressure/tightness, is not worse with exertion and is relieved by nitroglycerin. The patient's chest pain is not middle- or left-sided, is not well-localized, is not sharp and does not radiate to the arms/jaw/neck. The patient does not complain of nausea and denies diaphoresis. The patient has no history of stroke, has no history of peripheral artery disease, has not smoked in the past 90 days, denies any history of treated diabetes, has no relevant family history of coronary artery disease (first degree relative at less than age 46), has no history of hypercholesterolemia and does not have an elevated BMI (>=30).   Past Medical History:  Diagnosis Date  . Hypertension     There are no problems to display for this patient.   Past Surgical History:  Procedure Laterality Date  . CESAREAN SECTION  1992, 1999, 2003, 2008     OB History   No obstetric history on file.     History reviewed. No pertinent family history.  Social History   Tobacco Use  . Smoking status: Never Smoker  . Smokeless tobacco: Never Used  Vaping Use  . Vaping Use: Never used  Substance Use Topics  . Alcohol use: Yes    Comment: occasional   . Drug use: No    Home Medications Prior to Admission medications   Medication  Sig Start Date End Date Taking? Authorizing Provider  amLODipine (NORVASC) 5 MG tablet Take 5 mg by mouth every morning. 09/16/17   [provider]  Aspirin-Acetaminophen-Caffeine (GOODY HEADACHE PO) Take 1 packet by mouth daily as needed.    [provider]  hydrochlorothiazide (MICROZIDE) 12.5 MG capsule Take 12.5 mg by mouth daily.    [provider]  Linaclotide Karlene Einstein) 145 MCG CAPS capsule Take 145 mcg by mouth daily.    [provider]  loratadine (CLARITIN) 10 MG tablet Take 10 mg by mouth  daily.    [provider]  metoCLOPramide (REGLAN) 10 MG tablet Take 1 tablet (10 mg total) by mouth every 6 (six) hours. 04/03/18   Cristina Gong, PA-C  NIFEdipine (PROCARDIA-XL/NIFEDICAL-XL) 30 MG 24 hr tablet TAKE 1 TABLET(30 MG) BY MOUTH DAILY 08/17/19   [provider]  omeprazole (PRILOSEC) 20 MG capsule Take 20 mg by mouth every morning.    [provider]  ondansetron (ZOFRAN ODT) 4 MG disintegrating tablet Take 1 tablet (4 mg total) by mouth every 8 (eight) hours as needed for nausea or vomiting. 04/03/18   Cristina Gong, PA-C  oxyCODONE-acetaminophen (PERCOCET/ROXICET) 5-325 MG tablet Take 1 tablet by mouth every 4 (four) hours as needed. 09/04/19   Ward, Layla Maw, DO  pantoprazole (PROTONIX) 20 MG tablet Take 1 tablet (20 mg total) by mouth daily. 08/21/16   Arby Barrette, MD  potassium chloride SA (KLOR-CON) 20 MEQ tablet Take 2 tablets (40 mEq total) by mouth daily. 09/04/19   Ward, Layla Maw, DO  Rivaroxaban 15 & 20 MG TBPK Follow package directions: Take one 15mg  tablet by mouth twice a day. On day 22, switch to one 20mg  tablet once a day. Take with food. 09/04/19   Ward, , DO    Allergies    Patient has no known allergies.  Review of Systems   Review of Systems  Constitutional: Negative for fever.  HENT: Positive for sore throat.   Respiratory: Positive for cough and shortness of breath.   Cardiovascular: Negative for chest pain and leg swelling.  Gastrointestinal: Negative for abdominal pain, nausea and vomiting.  Genitourinary: Negative for dysuria and hematuria.  Neurological: Negative for headaches.  All other systems reviewed and are negative.   Physical Exam Updated Vital Signs BP 121/82   Pulse 69   Temp 98.9 F (37.2 C) (Oral)   Resp 15   SpO2 100% Comment: ambulating  Physical Exam Vitals and nursing note reviewed.  Constitutional:      Appearance: Normal appearance. She is well-developed.     Comments: Appears  uncomfortable no acute distress.  She appears anxious.  HENT:     Head: Normocephalic and atraumatic.     Mouth/Throat:     Comments: Posterior oropharynx is clear without any signs of erythema, edema, exudates. Eyes:     General: Lids are normal.     Conjunctiva/sclera: Conjunctivae normal.     Pupils: Pupils are equal, round, and reactive to light.  Cardiovascular:     Rate and Rhythm: Normal rate and regular rhythm.     Pulses: Normal pulses.          Radial pulses are 2+ on the right side and 2+ on the left side.       Dorsalis pedis pulses are 2+ on the right side and 2+ on the left side.     Heart sounds: Normal heart sounds. No murmur heard.  No friction rub. No gallop.  Pulmonary:     Effort: Pulmonary effort is normal. Tachypnea present.     Breath sounds: Normal breath sounds.     Comments: Slight tachypnea.  Able speak in full sentences without any difficulty.  No evidence of respiratory distress.  Lungs clear to auscultation bilaterally.  Symmetric chest rise.  No wheezing, rales, rhonchi. Abdominal:     Palpations: Abdomen is soft. Abdomen is not rigid.     Tenderness: There is no abdominal tenderness. There is no guarding.  Musculoskeletal:        General: Normal range of motion.     Cervical back: Full passive range of motion without pain.     Comments: Bilateral lower extremities are symmetric in appearance without any overlying warmth, erythema.  Skin:    General: Skin is warm and dry.     Capillary Refill: Capillary refill takes less than 2 seconds.  Neurological:     Mental Status: She is alert and oriented to person, place, and time.  Psychiatric:        Speech: Speech normal.     ED Results / Procedures / Treatments   Labs (all labs ordered are listed, but only abnormal results are displayed) Labs Reviewed  BASIC METABOLIC PANEL - Abnormal; Notable for the following components:      Result Value   Potassium 3.0 (*)    Calcium 8.7 (*)    All other  components within normal limits  SARS CORONAVIRUS 2 BY RT PCR (HOSPITAL ORDER, PERFORMED IN [facility])  CBC  PREGNANCY, URINE  TROPONIN I (HIGH SENSITIVITY)  TROPONIN I (HIGH SENSITIVITY)    EKG EKG Interpretation  Date/Time:  Sunday January 15 2020 12:26:06 EDT Ventricular Rate:  66 PR Interval:  170 QRS Duration: 82 QT Interval:  426 QTC Calculation: 446 R Axis:   58 Text Interpretation: Normal sinus rhythm with sinus arrhythmia Cannot rule out Anterior infarct , age undetermined Abnormal ECG Confirmed by Cherlynn Perches ([id number]) on 01/15/2020 2:35:28 PM   Radiology DG Chest 2 View  Result Date: 01/15/2020 CLINICAL DATA:  Chest pain and shortness of breath since yesterday. EXAM: CHEST - 2 VIEW COMPARISON:  09/03/2019 FINDINGS: The heart size and mediastinal contours are within normal limits. Both lungs are clear. The visualized skeletal structures are unremarkable. IMPRESSION: No active cardiopulmonary disease. Electronically Signed   By: Elberta Fortis M.D.   On: 01/15/2020 13:06   CT Angio Chest PE W and/or Wo Contrast  Result Date: 01/15/2020 CLINICAL DATA:  Chest pain radiating to back which shortness-of-breath. Suspect pulmonary embolism. EXAM: CT ANGIOGRAPHY CHEST WITH CONTRAST TECHNIQUE: Multidetector CT imaging of the chest was performed using the standard protocol during bolus administration of intravenous contrast. Multiplanar CT image reconstructions and MIPs were obtained to evaluate the vascular anatomy. CONTRAST:  56mL OMNIPAQUE IOHEXOL 350 MG/ML SOLN COMPARISON:  09/04/2019 FINDINGS: Cardiovascular: Heart is normal size. Thoracic aorta is normal in caliber without evidence of dissection or aneurysm. Pulmonary arterial system is well opacified without evidence of emboli. Mediastinum/Nodes: No mediastinal or hilar adenopathy. Remaining mediastinal structures are within normal. Lungs/Pleura: Lungs are adequately inflated without focal airspace consolidation or  effusion. Airways are normal. Upper Abdomen: There are a few small liver hypodensities unchanged likely cysts. No acute findings. Musculoskeletal: No focal abnormality. Review of the MIP images confirms the above findings. IMPRESSION: 1. No acute cardiopulmonary disease and no evidence of pulmonary embolism. 2. Few small liver hypodensities unchanged likely cysts. Electronically Signed   By: Elberta Fortis  M.D.   On: 01/15/2020 14:51    Procedures Procedures (including critical care time)  Medications Ordered in ED Medications  nitroGLYCERIN (NITROSTAT) SL tablet 0.4 mg (0.4 mg Sublingual Given 01/15/20 1432)  sodium chloride 0.9 % bolus 1,000 mL (0 mLs Intravenous Stopped 01/15/20 1552)  ondansetron (ZOFRAN) injection 4 mg (4 mg Intravenous Given 01/15/20 1342)  morphine 4 MG/ML injection 4 mg (4 mg Intravenous Given 01/15/20 1343)  iohexol (OMNIPAQUE) 350 MG/ML injection 100 mL (86 mLs Intravenous Contrast Given 01/15/20 1415)  potassium chloride SA (KLOR-CON) CR tablet 40 mEq (40 mEq Oral Given 01/15/20 1432)    ED Course  I have reviewed the triage vital signs and the nursing notes.  Pertinent labs & imaging results that were available during my care of the patient were reviewed by me and considered in my medical decision making (see chart for details).    MDM Rules/Calculators/A&P HEAR Score: 43                        44 year old female who presents for evaluation of chest pain, shortness of breath that began yesterday.  She reports initially was intermittent but then today became more constant.  She states that since this morning, she has had heaviness/pressure in her chest as well as shortness of breath.  Chest pain is not worse with exertion but shortness of breath is.  She states the chest pain is pleuritic.  She states it feels like when she had pulmonary embolisms back in April 2021.  On initial arrival, she is afebrile, she is slightly hypertensive.  She does appear very anxious this is  likely reflecting in her blood pressure.  No evidence of respiratory distress.  She is slightly tachypneic but I think this is most likely related to anxiety.  She has no evidence of wheezing, abnormal lung sounds on lung exam.  She does report that she recently had some sore throat and that her daughter has been sick with RSV.  She did get Covid in January 2021.  She has not gotten vaccinated.  Low suspicion for ACS etiology as she does have some atypical features but is a consideration. Do not suspect aortic dissection given history/physical exam.  Also consider PE.  She does tell me that she had a month where she was not on Xarelto.  She had her Xarelto reinstated and states she has been on it constantly since then.  No other PE risk factors.  Also consider infectious etiology.  Plan check labs, chest x-ray.  At this time, she is not considered low risk and do not feel a D-dimer in the setting is appropriate.  We will plan for CTA of chest for evaluation of PE. Will give morphine, nitro. Discussed patient with Dr. Myrtis Ser who is in agreement with plan.   I reviewed her records.  She had bilateral subsegmental PEs noted in April 2021.  At that time, she was very close postpartum and she tells me that the left that was the cause.  She had no other provoked risk factor for PE.  She states that she does not know if she has gotten a coagulation panel done but states that her primary care doctor did other tests that she was fine.  Initial troponin negative.  Urine pregnancy negative.  BMP shows potassium of 3.0.  Repletion given.  CBC shows no leukocytosis or anemia.  Covid is negative.  Chest x-ray unremarkable.  CTA of chest shows no evidence of  PE.  She has few small liver hypodensities unchanged from previous imaging.  Reevaluation.  Patient reports improvement in pain.  She states that she feels much better than initially came in.  She does appear much more comfortable.  Vital signs are normalized.  We will  plan for delta troponin.  We will plan to ambulate.  Given patient's history/risk factors, she is a heart score of 3.  Plan for delta troponin.  Patient able to ambulate in the ED with mild maintaining O2 sats greater than 98% on room air.  Delta troponin is negative.  Reevaluation.  Patient is resting comfortably with no signs of distress.  She reports chest pain is completely gone.  Her blood pressure has normalized here in the ED.  Unclear etiology of her symptoms.  Do not suspect this is unstable angina.  Her chest pain is not worse with exertion.  Question if there is an anxiety component.  This is her second visit for chest pain.  She does have hypertension and is on 2 hypertensive medications.  She may benefit from outpatient cardiology evaluation.  At this time, patient is hemodynamically stable with no signs of distress. At this time, patient exhibits no emergent life-threatening condition that require further evaluation in ED. Patient had ample opportunity for questions and discussion. All patient's questions were answered with full understanding. Strict return precautions discussed. Patient expresses understanding and agreement to plan.   Portions of this note were generated with Scientist, clinical (histocompatibility and immunogenetics). Dictation errors may occur despite best attempts at proofreading.   Final Clinical Impression(s) / ED Diagnoses Final diagnoses:  Atypical chest pain  Shortness of breath    Rx / DC Orders ED Discharge Orders    None       Rosana Hoes 01/15/20 1858    Sabino Donovan, MD 01/16/20 [phone]

## 2020-01-15 NOTE — ED Triage Notes (Addendum)
Pt here with SOB and chest pain since yesterday. States it feels like it did when she had a blood clot. Crushing 10/10 pain. Sore throat. Had covid in December. Pt on xarelto.

## 2020-04-29 ENCOUNTER — Encounter (HOSPITAL_BASED_OUTPATIENT_CLINIC_OR_DEPARTMENT_OTHER): Payer: Self-pay | Admitting: Emergency Medicine

## 2020-04-29 ENCOUNTER — Other Ambulatory Visit: Payer: Self-pay

## 2020-04-29 ENCOUNTER — Emergency Department (HOSPITAL_BASED_OUTPATIENT_CLINIC_OR_DEPARTMENT_OTHER)
Admission: EM | Admit: 2020-04-29 | Discharge: 2020-04-29 | Disposition: A | Discharge status: Home / Self Care | Payer: Medicaid Other | Attending: Emergency Medicine | Admitting: Emergency Medicine

## 2020-04-29 DIAGNOSIS — R0602 Shortness of breath: Secondary | ICD-10-CM | POA: Diagnosis not present

## 2020-04-29 DIAGNOSIS — Z79899 Other long term (current) drug therapy: Secondary | ICD-10-CM | POA: Insufficient documentation

## 2020-04-29 DIAGNOSIS — Z7901 Long term (current) use of anticoagulants: Secondary | ICD-10-CM | POA: Diagnosis not present

## 2020-04-29 DIAGNOSIS — R519 Headache, unspecified: Secondary | ICD-10-CM | POA: Diagnosis not present

## 2020-04-29 DIAGNOSIS — Z8616 Personal history of COVID-19: Secondary | ICD-10-CM | POA: Insufficient documentation

## 2020-04-29 DIAGNOSIS — R0789 Other chest pain: Secondary | ICD-10-CM | POA: Insufficient documentation

## 2020-04-29 DIAGNOSIS — I10 Essential (primary) hypertension: Secondary | ICD-10-CM | POA: Diagnosis not present

## 2020-04-29 DIAGNOSIS — M542 Cervicalgia: Secondary | ICD-10-CM | POA: Diagnosis not present

## 2020-04-29 DIAGNOSIS — Z86711 Personal history of pulmonary embolism: Secondary | ICD-10-CM | POA: Diagnosis not present

## 2020-04-29 DIAGNOSIS — R252 Cramp and spasm: Secondary | ICD-10-CM | POA: Diagnosis not present

## 2020-04-29 LAB — CBC WITH DIFFERENTIAL/PLATELET
Abs Immature Granulocytes: 0.01 10*3/uL (ref 0.00–0.07)
Basophils Absolute: 0 10*3/uL (ref 0.0–0.1)
Basophils Relative: 1 %
Eosinophils Absolute: 0.1 10*3/uL (ref 0.0–0.5)
Eosinophils Relative: 1 %
HCT: 33.1 % — ABNORMAL LOW (ref 36.0–46.0)
Hemoglobin: 10.6 g/dL — ABNORMAL LOW (ref 12.0–15.0)
Immature Granulocytes: 0 %
Lymphocytes Relative: 33 %
Lymphs Abs: 1.8 10*3/uL (ref 0.7–4.0)
MCH: 26.6 pg (ref 26.0–34.0)
MCHC: 32 g/dL (ref 30.0–36.0)
MCV: 83.2 fL (ref 80.0–100.0)
Monocytes Absolute: 0.5 10*3/uL (ref 0.1–1.0)
Monocytes Relative: 9 %
Neutro Abs: 3 10*3/uL (ref 1.7–7.7)
Neutrophils Relative %: 56 %
Platelets: 371 10*3/uL (ref 150–400)
RBC: 3.98 MIL/uL (ref 3.87–5.11)
RDW: 13.9 % (ref 11.5–15.5)
WBC: 5.4 10*3/uL (ref 4.0–10.5)
nRBC: 0 % (ref 0.0–0.2)

## 2020-04-29 LAB — BASIC METABOLIC PANEL
Anion gap: 8 (ref 5–15)
BUN: 15 mg/dL (ref 6–20)
CO2: 25 mmol/L (ref 22–32)
Calcium: 8.7 mg/dL — ABNORMAL LOW (ref 8.9–10.3)
Chloride: 103 mmol/L (ref 98–111)
Creatinine, Ser: 0.81 mg/dL (ref 0.44–1.00)
GFR, Estimated: >60 mL/min (ref >60)
Glucose, Bld: 93 mg/dL (ref 70–99)
Potassium: 3.1 mmol/L — ABNORMAL LOW (ref 3.5–5.1)
Sodium: 136 mmol/L (ref 135–145)

## 2020-04-29 LAB — TROPONIN I (HIGH SENSITIVITY): Troponin I (High Sensitivity): 5 ng/L (ref <18)

## 2020-04-29 MED ORDER — METHOCARBAMOL 500 MG PO TABS
500.0000 mg | ORAL_TABLET | Freq: Two times a day (BID) | ORAL | 0 refills | Status: DC
Start: 2020-04-29 — End: 2020-12-28

## 2020-04-29 MED ORDER — ALBUTEROL SULFATE HFA 108 (90 BASE) MCG/ACT IN AERS
2.0000 | INHALATION_SPRAY | Freq: Once | RESPIRATORY_TRACT | Status: AC
  Administered 2020-04-29: 2 via RESPIRATORY_TRACT
  Filled 2020-04-29: qty 6.7

## 2020-04-29 MED ORDER — METHOCARBAMOL 500 MG PO TABS
500.0000 mg | ORAL_TABLET | Freq: Two times a day (BID) | ORAL | 0 refills | Status: DC
Start: 2020-04-29 — End: 2020-04-29

## 2020-04-29 MED ORDER — DIAZEPAM 2 MG PO TABS
2.0000 mg | ORAL_TABLET | Freq: Once | ORAL | Status: AC
  Administered 2020-04-29: 2 mg via ORAL
  Filled 2020-04-29: qty 1

## 2020-04-29 MED ORDER — NITROGLYCERIN 0.4 MG SL SUBL
0.4000 mg | SUBLINGUAL_TABLET | Freq: Once | SUBLINGUAL | Status: AC
  Administered 2020-04-29: 0.4 mg via SUBLINGUAL
  Filled 2020-04-29: qty 1

## 2020-04-29 MED ORDER — NAPROXEN 250 MG PO TABS
500.0000 mg | ORAL_TABLET | Freq: Once | ORAL | Status: AC
  Administered 2020-04-29: 500 mg via ORAL
  Filled 2020-04-29: qty 2

## 2020-04-29 NOTE — ED Notes (Signed)
Pt instructed not to get up after rec Valium PO, side rails x 2 up, call bell within reach, placed on cont POX monitoring with int NBP assessments. Comfort measures provided

## 2020-04-29 NOTE — ED Notes (Signed)
She smilingly tells me she feels "great now".

## 2020-04-29 NOTE — Discharge Instructions (Addendum)
Recently ER for chest discomfort.  Please call the information provided to follow-up with one of the clinics.  [facility] had a Covid follow-up set up.  Dr. Drue Novel is a primary care doctor working in this building and [facility] is run by the residency and teaching service.  We recommend that you take the inhaler as requested when you start feeling chest tightness.  Take the muscle relaxant for your spasms and read the instructions provided.

## 2020-04-29 NOTE — ED Triage Notes (Signed)
Pt c/o right sided neck, chest pain, shoulder pain, leg pain and shortness of breath onset Tuesday. Daughter tested positive for COVID on the 7th. Pt tested negative for COVID on the 9th.

## 2020-04-29 NOTE — ED Provider Notes (Addendum)
[facility] EMERGENCY DEPARTMENT Provider Note   CSN: [id number] Arrival date & time: 04/29/20  1140     History Chief Complaint  Patient presents with  . Chest Pain    Jennifer Mcgrath is a 44 y.o. female.  HPI     44 year old female comes in w/ chief complaint of chest pain. Patient has history of hypertension late last year she developed Covid and subsequently PE, on Xarelto.  Patient comes to the ER with chief complaint of chest tightness.  She is also having pain over her neck that is radiating down her right arm.  Her chest tightness and and present for the last 3 or 4 days.  Symptoms are waxing and waning in intensity but constantly there.  She has not taken any medication for it.  She has some associated shortness of breath.  She does not think she has been using, but she has not used any bronchodilators.  No new cough.  She has been taking her Xarelto as prescribed.    In addition the patient is complaining of pain shooting down her neck into her right arm.  That pain often will also move towards the chest and causes her to have headaches.  She has no associated numbness, tingling, focal weakness, vision change, slurred speech.  She had similar symptoms several months back and received a shot in the back that helped for.  She denies any asthma history.  Past Medical History:  Diagnosis Date  . Hypertension     There are no problems to display for this patient.   Past Surgical History:  Procedure Laterality Date  . CESAREAN SECTION  1992, 1999, 2003, 2008     OB History   No obstetric history on file.     No family history on file.  Social History   Tobacco Use  . Smoking status: Never Smoker  . Smokeless tobacco: Never Used  Vaping Use  . Vaping Use: Never used  Substance Use Topics  . Alcohol use: Yes    Comment: occasional   . Drug use: No    Home Medications Prior to Admission medications   Medication Sig Start Date End Date Taking?  Authorizing Provider  amLODipine (NORVASC) 5 MG tablet Take 5 mg by mouth every morning. 09/16/17   [provider]  Aspirin-Acetaminophen-Caffeine (GOODY HEADACHE PO) Take 1 packet by mouth daily as needed.    [provider]  hydrochlorothiazide (MICROZIDE) 12.5 MG capsule Take 12.5 mg by mouth daily.    [provider]  Linaclotide Karlene Einstein) 145 MCG CAPS capsule Take 145 mcg by mouth daily.    [provider]  loratadine (CLARITIN) 10 MG tablet Take 10 mg by mouth daily.    [provider]  methocarbamol (ROBAXIN) 500 MG tablet Take 1 tablet (500 mg total) by mouth 2 (two) times daily. 04/29/20   Derwood Kaplan, MD  metoCLOPramide (REGLAN) 10 MG tablet Take 1 tablet (10 mg total) by mouth every 6 (six) hours. 04/03/18   Cristina Gong, PA-C  NIFEdipine (PROCARDIA-XL/NIFEDICAL-XL) 30 MG 24 hr tablet TAKE 1 TABLET(30 MG) BY MOUTH DAILY 08/17/19   [provider]  omeprazole (PRILOSEC) 20 MG capsule Take 20 mg by mouth every morning.    [provider]  ondansetron (ZOFRAN ODT) 4 MG disintegrating tablet Take 1 tablet (4 mg total) by mouth every 8 (eight) hours as needed for nausea or vomiting. 04/03/18   Cristina Gong, PA-C  oxyCODONE-acetaminophen (PERCOCET/ROXICET) 5-325 MG tablet Take  1 tablet by mouth every 4 (four) hours as needed. 09/04/19   Ward, Layla Maw, DO  pantoprazole (PROTONIX) 20 MG tablet Take 1 tablet (20 mg total) by mouth daily. 08/21/16   Arby Barrette, MD  potassium chloride SA (KLOR-CON) 20 MEQ tablet Take 2 tablets (40 mEq total) by mouth daily. 09/04/19   Ward, Layla Maw, DO  Rivaroxaban 15 & 20 MG TBPK Follow package directions: Take one 15mg  tablet by mouth twice a day. On day 22, switch to one 20mg  tablet once a day. Take with food. 09/04/19   Ward, , DO    Allergies    Patient has no known allergies.  Review of Systems   Review of Systems  Constitutional: Positive for activity change.    Respiratory: Positive for chest tightness and shortness of breath.   Cardiovascular: Positive for chest pain.  Musculoskeletal: Positive for neck pain.  Allergic/Immunologic: Negative for immunocompromised state.  Hematological: Does not bruise/bleed easily.  All other systems reviewed and are negative.   Physical Exam Updated Vital Signs BP (!) 161/105 (BP Location: Left Arm)   Pulse 60   Temp 98.7 F (37.1 C) (Oral)   Resp 16   Ht 5\' 2"  (1.575 m)   Wt 82.6 kg   LMP 04/16/2020   SpO2 100%   BMI 33.29 kg/m   Physical Exam Vitals and nursing note reviewed.  Constitutional:      Appearance: She is well-developed.  HENT:     Head: Normocephalic and atraumatic.  Cardiovascular:     Rate and Rhythm: Normal rate.  Pulmonary:     Effort: Pulmonary effort is normal.  Abdominal:     General: Bowel sounds are normal.  Musculoskeletal:     Cervical back: Normal range of motion and neck supple.     Comments: Patient has reproducible tenderness with palpation of the paraspinal region over the cervical spine and scapular region -all right-sided  Skin:    General: Skin is warm and dry.  Neurological:     Mental Status: She is alert and oriented to person, place, and time.     ED Results / Procedures / Treatments   Labs (all labs ordered are listed, but only abnormal results are displayed) Labs Reviewed  BASIC METABOLIC PANEL - Abnormal; Notable for the following components:      Result Value   Potassium 3.1 (*)    Calcium 8.7 (*)    All other components within normal limits  CBC WITH DIFFERENTIAL/PLATELET - Abnormal; Notable for the following components:   Hemoglobin 10.6 (*)    HCT 33.1 (*)    All other components within normal limits  TROPONIN I (HIGH SENSITIVITY)    EKG EKG Interpretation  Date/Time:  Sunday April 29 2020 11:52:01 EST Ventricular Rate:  63 PR Interval:  168 QRS Duration: 74 QT Interval:  438 QTC Calculation: 448 R Axis:   66 Text  Interpretation: Normal sinus rhythm Normal ECG No acute changes No significant change since last tracing Confirmed by 04/18/2020 [phone]) on 04/29/2020 1:09:50 PM   Radiology No results found.  Procedures Procedures (including critical care time)  Medications Ordered in ED Medications  albuterol (VENTOLIN HFA) 108 (90 Base) MCG/ACT inhaler 2 puff (2 puffs Inhalation Given 04/29/20 1347)  nitroGLYCERIN (NITROSTAT) SL tablet 0.4 mg (0.4 mg Sublingual Given 04/29/20 1350)  diazepam (VALIUM) tablet 2 mg (2 mg Oral Given 04/29/20 1348)  naproxen (NAPROSYN) tablet 500 mg (500 mg Oral Given 04/29/20 1348)  ED Course  I have reviewed the triage vital signs and the nursing notes.  Pertinent labs & imaging results that were available during my care of the patient were reviewed by me and considered in my medical decision making (see chart for details).  Clinical Course as of Apr 29 1526  Wynelle Link Apr 29, 2020  1526 Patient reassessed. Pt is comfortable at this time. Pain resolved.  Results of the workup discussed. Strict ER return precautions discussed. Follow up instruction discussed, and pt agrees with the plan and is comfortable with it.    [AN]    Clinical Course User Index [AN] Derwood Kaplan, MD   MDM Rules/Calculators/A&P                          44 year old female comes in a chief complaint of neck pain, chest pain.  On exam, patient is noted to have tenderness over the scapula and paraspinal cervical region with palpation.  I suspect that her symptoms of neck pain and arm pain are because of severe muscle spasms.  We discussed instructions on how to alleviate them and how to prevent them in first place.  Her chest tightness does not have any associated cough.  No ACS-like symptoms.  She has had for CT PEs in the last 2 years, that 2 this year were negative for PE.  I do not think she needs further PE testing.  Clinical suspicion for PE is extremely low especially since she is  taking Xarelto.  We will give her some bronchodilator.  She states that in the past nitro helped her, therefore we will give her nitro as well.  Final Clinical Impression(s) / ED Diagnoses Final diagnoses:  Chest tightness  Spasm    Rx / DC Orders ED Discharge Orders         Ordered    methocarbamol (ROBAXIN) 500 MG tablet  2 times daily,   Status:  Discontinued        04/29/20 1520    methocarbamol (ROBAXIN) 500 MG tablet  2 times daily        04/29/20 1526           Derwood Kaplan, MD 04/29/20 1526    Derwood Kaplan, MD 04/29/20 1527

## 2020-09-04 ENCOUNTER — Ambulatory Visit: Payer: Medicaid Other | Admitting: Medical

## 2020-09-09 ENCOUNTER — Emergency Department (HOSPITAL_BASED_OUTPATIENT_CLINIC_OR_DEPARTMENT_OTHER)
Admission: EM | Admit: 2020-09-09 | Discharge: 2020-09-09 | Disposition: A | Discharge status: Home / Self Care | Payer: Medicaid Other | Attending: Emergency Medicine | Admitting: Emergency Medicine

## 2020-09-09 ENCOUNTER — Emergency Department (HOSPITAL_BASED_OUTPATIENT_CLINIC_OR_DEPARTMENT_OTHER): Payer: Medicaid Other

## 2020-09-09 ENCOUNTER — Encounter (HOSPITAL_BASED_OUTPATIENT_CLINIC_OR_DEPARTMENT_OTHER): Payer: Self-pay | Admitting: Emergency Medicine

## 2020-09-09 ENCOUNTER — Other Ambulatory Visit: Payer: Self-pay

## 2020-09-09 DIAGNOSIS — R519 Headache, unspecified: Secondary | ICD-10-CM | POA: Insufficient documentation

## 2020-09-09 DIAGNOSIS — Z79899 Other long term (current) drug therapy: Secondary | ICD-10-CM | POA: Diagnosis not present

## 2020-09-09 DIAGNOSIS — R42 Dizziness and giddiness: Secondary | ICD-10-CM | POA: Insufficient documentation

## 2020-09-09 DIAGNOSIS — I10 Essential (primary) hypertension: Secondary | ICD-10-CM | POA: Insufficient documentation

## 2020-09-09 DIAGNOSIS — Z7982 Long term (current) use of aspirin: Secondary | ICD-10-CM | POA: Insufficient documentation

## 2020-09-09 DIAGNOSIS — E876 Hypokalemia: Secondary | ICD-10-CM | POA: Diagnosis not present

## 2020-09-09 HISTORY — DX: Gastro-esophageal reflux disease without esophagitis: K21.9

## 2020-09-09 HISTORY — DX: Other pulmonary embolism without acute cor pulmonale: I26.99

## 2020-09-09 LAB — BASIC METABOLIC PANEL
Anion gap: 7 (ref 5–15)
BUN: 11 mg/dL (ref 6–20)
CO2: 28 mmol/L (ref 22–32)
Calcium: 8.6 mg/dL — ABNORMAL LOW (ref 8.9–10.3)
Chloride: 102 mmol/L (ref 98–111)
Creatinine, Ser: 0.68 mg/dL (ref 0.44–1.00)
GFR, Estimated: >60 mL/min (ref >60)
Glucose, Bld: 91 mg/dL (ref 70–99)
Potassium: 2.9 mmol/L — ABNORMAL LOW (ref 3.5–5.1)
Sodium: 137 mmol/L (ref 135–145)

## 2020-09-09 LAB — CBC WITH DIFFERENTIAL/PLATELET
Abs Immature Granulocytes: 0.01 10*3/uL (ref 0.00–0.07)
Basophils Absolute: 0 10*3/uL (ref 0.0–0.1)
Basophils Relative: 1 %
Eosinophils Absolute: 0.1 10*3/uL (ref 0.0–0.5)
Eosinophils Relative: 2 %
HCT: 31.9 % — ABNORMAL LOW (ref 36.0–46.0)
Hemoglobin: 9.9 g/dL — ABNORMAL LOW (ref 12.0–15.0)
Immature Granulocytes: 0 %
Lymphocytes Relative: 41 %
Lymphs Abs: 2 10*3/uL (ref 0.7–4.0)
MCH: 24.1 pg — ABNORMAL LOW (ref 26.0–34.0)
MCHC: 31 g/dL (ref 30.0–36.0)
MCV: 77.6 fL — ABNORMAL LOW (ref 80.0–100.0)
Monocytes Absolute: 0.4 10*3/uL (ref 0.1–1.0)
Monocytes Relative: 8 %
Neutro Abs: 2.4 10*3/uL (ref 1.7–7.7)
Neutrophils Relative %: 48 %
Platelets: 328 10*3/uL (ref 150–400)
RBC: 4.11 MIL/uL (ref 3.87–5.11)
RDW: 20.5 % — ABNORMAL HIGH (ref 11.5–15.5)
WBC: 5 10*3/uL (ref 4.0–10.5)
nRBC: 0 % (ref 0.0–0.2)

## 2020-09-09 MED ORDER — POTASSIUM CHLORIDE CRYS ER 20 MEQ PO TBCR: 40.0000 meq | EXTENDED_RELEASE_TABLET | Freq: Every day | ORAL | 0 refills | Status: DC

## 2020-09-09 MED ORDER — MECLIZINE HCL 25 MG PO TABS: 25.0000 mg | ORAL_TABLET | Freq: Three times a day (TID) | ORAL | 0 refills | Status: DC | PRN

## 2020-09-09 MED ORDER — MECLIZINE HCL 25 MG PO TABS
25.0000 mg | ORAL_TABLET | Freq: Once | ORAL | Status: AC
  Administered 2020-09-09: 25 mg via ORAL
  Filled 2020-09-09: qty 1

## 2020-09-09 MED ORDER — SODIUM CHLORIDE 0.9 % IV BOLUS
1000.0000 mL | Freq: Once | INTRAVENOUS | Status: AC
Start: 2020-09-09 — End: 2020-09-09
  Administered 2020-09-09: 1000 mL via INTRAVENOUS

## 2020-09-09 MED ORDER — POTASSIUM CHLORIDE 10 MEQ/100ML IV SOLN
10.0000 meq | Freq: Once | INTRAVENOUS | Status: AC
  Administered 2020-09-09: 10 meq via INTRAVENOUS
  Filled 2020-09-09: qty 100

## 2020-09-09 MED ORDER — ONDANSETRON HCL 4 MG/2ML IJ SOLN
4.0000 mg | Freq: Once | INTRAMUSCULAR | Status: AC
  Administered 2020-09-09: 4 mg via INTRAVENOUS
  Filled 2020-09-09: qty 2

## 2020-09-09 NOTE — ED Provider Notes (Signed)
[facility] EMERGENCY DEPARTMENT Provider Note   CSN: [id number] Arrival date & time: 09/09/20  1036     History Chief Complaint  Patient presents with  . Dizziness    Jennifer Mcgrath is a 45 y.o. female.  HPI Patient presents with episodes of dizziness and feeling her heart race.  Has been going on and off for the last few days.  Will last a minute or 2.  Just.  On his own.  Feels little lightheaded with it happens.  States she will feel her heart going fast.  States she will also get a headache at the time.  The headache only comes on with her heart racing.  She will also get some tingling in the right arm and right neck when it happens.  Feels better in between.  Previous history of pulmonary embolisms during Covid infection.  Also had been shortly postpartum what happened.  Still on anticoagulation however. States she has had heavy menses which is not unusual for her.  States also if she previously had some difficulty with bowel movements after having some previous GI bleeding.  States she now feels as if she has to strain a bowel movement.  No current blood in the movements. Patient has some blood around her right eye.  Subconjunctival hemorrhage.  States that her small child hit her in the eye the other day.  Later on reexamination patient states that she was feeling more dizzy.  Now it is more of a spinning sensation.  Worse with standing.  She is feeling that without her heart racing.     Past Medical History:  Diagnosis Date  . GERD (gastroesophageal reflux disease)   . Hypertension   . PE (pulmonary thromboembolism) (HCC)     There are no problems to display for this patient.   Past Surgical History:  Procedure Laterality Date  . CESAREAN SECTION  1992, 1999, 2003, 2008  . TUBAL LIGATION       OB History   No obstetric history on file.     No family history on file.  Social History   Tobacco Use  . Smoking status: Never Smoker  . Smokeless tobacco:  Never Used  Vaping Use  . Vaping Use: Never used  Substance Use Topics  . Alcohol use: Yes    Comment: occasional   . Drug use: No    Home Medications Prior to Admission medications   Medication Sig Start Date End Date Taking? Authorizing Provider  Aspirin-Acetaminophen-Caffeine (GOODY HEADACHE PO) Take 1 packet by mouth daily as needed.   Yes [provider]  hydrochlorothiazide (MICROZIDE) 12.5 MG capsule Take 12.5 mg by mouth daily.   Yes [provider]  linaclotide (LINZESS) 145 MCG CAPS capsule Take 145 mcg by mouth daily.   Yes [provider]  meclizine (ANTIVERT) 25 MG tablet Take 1 tablet (25 mg total) by mouth 3 (three) times daily as needed for dizziness. 09/09/20  Yes Benjiman Core, MD  NIFEdipine (PROCARDIA-XL/NIFEDICAL-XL) 30 MG 24 hr tablet TAKE 1 TABLET(30 MG) BY MOUTH DAILY 08/17/19  Yes [provider]  rivaroxaban (XARELTO) 20 MG TABS tablet Take 20 mg by mouth daily with supper.   Yes [provider]  Rivaroxaban 15 & 20 MG TBPK Follow package directions: Take one 15mg  tablet by mouth twice a day. On day 22, switch to one 20mg  tablet once a day. Take with food. Patient taking differently: Take 20 mg by mouth daily. 09/04/19  Yes Ward, ,  DO  amLODipine (NORVASC) 5 MG tablet Take 5 mg by mouth every morning. 09/16/17   [provider]  loratadine (CLARITIN) 10 MG tablet Take 10 mg by mouth daily.    [provider]  methocarbamol (ROBAXIN) 500 MG tablet Take 1 tablet (500 mg total) by mouth 2 (two) times daily. 04/29/20   Derwood Kaplan, MD  metoCLOPramide (REGLAN) 10 MG tablet Take 1 tablet (10 mg total) by mouth every 6 (six) hours. 04/03/18   Cristina Gong, PA-C  omeprazole (PRILOSEC) 20 MG capsule Take 20 mg by mouth every morning.    [provider]  ondansetron (ZOFRAN ODT) 4 MG disintegrating tablet Take 1 tablet (4 mg total) by mouth every 8 (eight) hours as needed for nausea or  vomiting. 04/03/18   Cristina Gong, PA-C  oxyCODONE-acetaminophen (PERCOCET/ROXICET) 5-325 MG tablet Take 1 tablet by mouth every 4 (four) hours as needed. 09/04/19   Ward, Layla Maw, DO  pantoprazole (PROTONIX) 20 MG tablet Take 1 tablet (20 mg total) by mouth daily. 08/21/16   Arby Barrette, MD  potassium chloride SA (KLOR-CON) 20 MEQ tablet Take 2 tablets (40 mEq total) by mouth daily. 09/09/20   Benjiman Core, MD    Allergies    Patient has no known allergies.  Review of Systems   Review of Systems  Constitutional: Negative for appetite change.  HENT: Negative for congestion.   Respiratory: Negative for shortness of breath.   Cardiovascular: Positive for chest pain and palpitations.  Gastrointestinal: Positive for blood in stool. Negative for abdominal pain.  Genitourinary: Positive for vaginal bleeding. Negative for difficulty urinating.  Musculoskeletal: Negative for back pain.  Skin: Negative for rash.  Neurological: Positive for dizziness and headaches.  Psychiatric/Behavioral: Negative for confusion.    Physical Exam Updated Vital Signs BP (!) 151/105   Pulse 70   Temp 98.5 F (36.9 C) (Oral)   Resp 16   Ht 5\' 2"  (1.575 m)   Wt 83.9 kg   LMP 08/14/2020   SpO2 100%   BMI 33.84 kg/m   Physical Exam Vitals and nursing note reviewed.  HENT:     Head: Atraumatic.     Mouth/Throat:     Mouth: Mucous membranes are moist.  Eyes:     Pupils: Pupils are equal, round, and reactive to light.  Cardiovascular:     Rate and Rhythm: Regular rhythm.  Pulmonary:     Breath sounds: No wheezing or rhonchi.  Abdominal:     Tenderness: There is no abdominal tenderness.  Musculoskeletal:        General: No tenderness.     Cervical back: Neck supple.  Skin:    General: Skin is warm.     Capillary Refill: Capillary refill takes less than 2 seconds.  Neurological:     Mental Status: She is alert and oriented to person, place, and time.  Psychiatric:        Mood and  Affect: Mood normal.     ED Results / Procedures / Treatments   Labs (all labs ordered are listed, but only abnormal results are displayed) Labs Reviewed  BASIC METABOLIC PANEL - Abnormal; Notable for the following components:      Result Value   Potassium 2.9 (*)    Calcium 8.6 (*)    All other components within normal limits  CBC WITH DIFFERENTIAL/PLATELET - Abnormal; Notable for the following components:   Hemoglobin 9.9 (*)    HCT 31.9 (*)    MCV 77.6 (*)  MCH 24.1 (*)    RDW 20.5 (*)    All other components within normal limits    EKG EKG Interpretation  Date/Time:  Sunday September 09 2020 10:51:47 EDT Ventricular Rate:  75 PR Interval:  168 QRS Duration: 82 QT Interval:  424 QTC Calculation: 473 R Axis:   61 Text Interpretation: Normal sinus rhythm Normal ECG Confirmed by Gildardo Tickner ([id number]) on 09/09/2020 11:14:26 AM   Radiology CT Head Wo Contrast  Result Date: 09/09/2020 CLINICAL DATA:  Dizziness with right upper extremity tingling sensation EXAM: CT HEAD WITHOUT CONTRAST TECHNIQUE: Contiguous axial images were obtained from the base of the skull through the vertex without intravenous contrast. COMPARISON:  Oct 21, 2017 FINDINGS: Brain: Ventricles and sulci are normal in size and configuration. There is no intracranial mass, hemorrhage, extra-axial fluid collection, or midline shift. Brain parenchyma appears unremarkable. No evident acute infarct. Vascular: No hyperdense vessel.  No evident vascular calcification. Skull: The bony calvarium appears intact. Sinuses/Orbits: Visualized paranasal sinuses are clear. Visualized orbits appear symmetric bilaterally. Other: Mastoid air cells are clear. IMPRESSION: Study within normal limits. Electronically Signed   By: William  Woodruff III M.D.   On: 09/09/2020 13:38    Procedures Procedures   Medications Ordered in ED Medications  potassium chloride 10 mEq in 100 mL IVPB (10 mEq Intravenous New Bag/Given 09/09/20  1251)  sodium chloride 0.9 % bolus 1,000 mL (1,000 mLs Intravenous New Bag/Given 09/09/20 1250)  meclizine (ANTIVERT) tablet 25 mg (25 mg Oral Given 09/09/20 1330)  ondansetron (ZOFRAN) injection 4 mg (4 mg Intravenous Given 09/09/20 1248)    ED Course  I have reviewed the triage vital signs and the nursing notes.  Pertinent labs & imaging results that were available during my care of the patient were reviewed by me and considered in my medical decision making (see chart for details).    MDM Rules/Calculators/A&P                          Patient with dizziness.  Comes and goes.  Feels initially lightheaded.  But later appeared more vertiginous.  Had some feeling of the room spinning.  With her anticoagulation head CT done and was reassuring.  Feels better after treatment with Antivert and Zofran.  I think more likely a peripheral vertigo.  Central felt less likely.  Initially thought with the dizziness it may have been arrhythmia, however no arrhythmia during the time of the dizziness now.  Mild hyponatremia supplemented. Also after discussion with the patient she is on anticoagulation after pulmonary embolisms with COVID.  I am unsure about the duration of what this will need to be since she no longer has Covid I did know if she is still at higher risk.  We discussed following up with hematology to potential adjustment of her medications.  Will discharge home. Final Clinical Impression(s) / ED Diagnoses Final diagnoses:  Dizziness  Vertigo  Hyponatremia    Rx / DC Orders ED Discharge Orders         Ordered    meclizine (ANTIVERT) 25 MG tablet  3 times daily PRN        09/09/20 1411    potassium chloride SA (KLOR-CON) 20 MEQ tablet  Daily        04 /10/22 1411           03-21-1982, MD 09/09/20 1415

## 2020-09-09 NOTE — ED Notes (Signed)
ED Provider at bedside. 

## 2020-09-09 NOTE — ED Triage Notes (Signed)
States," Since Friday I have been feeling dizzy and tingling to right side of neck and right arm" N/V and lightheaded

## 2020-09-09 NOTE — ED Notes (Signed)
Assisted pt to BR; pt had difficulty ambulating d/t dizziness. Vomited upon returning to room.

## 2020-09-09 NOTE — ED Notes (Signed)
Pt feels much better; able to walk w/o difficulty.

## 2020-09-12 ENCOUNTER — Telehealth: Payer: Self-pay | Admitting: *Deleted

## 2020-09-12 NOTE — Telephone Encounter (Signed)
Per staff message from [facility] 09/11/20 - Called and lvm of upcoming appointments - mailed calendar with welcome packet

## 2020-09-27 ENCOUNTER — Encounter (HOSPITAL_BASED_OUTPATIENT_CLINIC_OR_DEPARTMENT_OTHER): Payer: Self-pay | Admitting: Emergency Medicine

## 2020-09-27 ENCOUNTER — Emergency Department (HOSPITAL_BASED_OUTPATIENT_CLINIC_OR_DEPARTMENT_OTHER)
Admission: EM | Admit: 2020-09-27 | Discharge: 2020-09-28 | Disposition: A | Discharge status: Home / Self Care | Payer: Medicaid Other | Attending: Emergency Medicine | Admitting: Emergency Medicine

## 2020-09-27 ENCOUNTER — Emergency Department (HOSPITAL_BASED_OUTPATIENT_CLINIC_OR_DEPARTMENT_OTHER): Payer: Medicaid Other

## 2020-09-27 ENCOUNTER — Other Ambulatory Visit: Payer: Self-pay

## 2020-09-27 DIAGNOSIS — Z79899 Other long term (current) drug therapy: Secondary | ICD-10-CM | POA: Diagnosis not present

## 2020-09-27 DIAGNOSIS — K219 Gastro-esophageal reflux disease without esophagitis: Secondary | ICD-10-CM | POA: Diagnosis not present

## 2020-09-27 DIAGNOSIS — I10 Essential (primary) hypertension: Secondary | ICD-10-CM | POA: Diagnosis not present

## 2020-09-27 DIAGNOSIS — Z7901 Long term (current) use of anticoagulants: Secondary | ICD-10-CM | POA: Insufficient documentation

## 2020-09-27 DIAGNOSIS — R109 Unspecified abdominal pain: Secondary | ICD-10-CM | POA: Diagnosis not present

## 2020-09-27 DIAGNOSIS — R319 Hematuria, unspecified: Secondary | ICD-10-CM | POA: Diagnosis not present

## 2020-09-27 DIAGNOSIS — M545 Low back pain, unspecified: Secondary | ICD-10-CM | POA: Insufficient documentation

## 2020-09-27 LAB — URINALYSIS, ROUTINE W REFLEX MICROSCOPIC
Bilirubin Urine: NEGATIVE
Glucose, UA: NEGATIVE mg/dL
Ketones, ur: NEGATIVE mg/dL
Leukocytes,Ua: NEGATIVE
Nitrite: NEGATIVE
Protein, ur: NEGATIVE mg/dL
Specific Gravity, Urine: 1.01 (ref 1.005–1.030)
pH: 7 (ref 5.0–8.0)

## 2020-09-27 LAB — URINALYSIS, MICROSCOPIC (REFLEX)

## 2020-09-27 LAB — CBC
HCT: 32.4 % — ABNORMAL LOW (ref 36.0–46.0)
Hemoglobin: 10.1 g/dL — ABNORMAL LOW (ref 12.0–15.0)
MCH: 24.9 pg — ABNORMAL LOW (ref 26.0–34.0)
MCHC: 31.2 g/dL (ref 30.0–36.0)
MCV: 80 fL (ref 80.0–100.0)
Platelets: 377 10*3/uL (ref 150–400)
RBC: 4.05 MIL/uL (ref 3.87–5.11)
RDW: 20.4 % — ABNORMAL HIGH (ref 11.5–15.5)
WBC: 8.3 10*3/uL (ref 4.0–10.5)
nRBC: 0 % (ref 0.0–0.2)

## 2020-09-27 LAB — BASIC METABOLIC PANEL
Anion gap: 11 (ref 5–15)
BUN: 16 mg/dL (ref 6–20)
CO2: 25 mmol/L (ref 22–32)
Calcium: 9 mg/dL (ref 8.9–10.3)
Chloride: 100 mmol/L (ref 98–111)
Creatinine, Ser: 0.78 mg/dL (ref 0.44–1.00)
GFR, Estimated: >60 mL/min (ref >60)
Glucose, Bld: 86 mg/dL (ref 70–99)
Potassium: 3 mmol/L — ABNORMAL LOW (ref 3.5–5.1)
Sodium: 136 mmol/L (ref 135–145)

## 2020-09-27 LAB — PREGNANCY, URINE: Preg Test, Ur: NEGATIVE

## 2020-09-27 MED ORDER — HYDROMORPHONE HCL 1 MG/ML IJ SOLN
1.0000 mg | Freq: Once | INTRAMUSCULAR | Status: AC
Start: 2020-09-27 — End: 2020-09-27
  Administered 2020-09-27: 1 mg via INTRAVENOUS
  Filled 2020-09-27: qty 1

## 2020-09-27 MED ORDER — SODIUM CHLORIDE 0.9 % IV SOLN: INTRAVENOUS | Status: DC

## 2020-09-27 MED ORDER — ONDANSETRON HCL 4 MG/2ML IJ SOLN
4.0000 mg | Freq: Once | INTRAMUSCULAR | Status: AC
  Administered 2020-09-27: 4 mg via INTRAVENOUS
  Filled 2020-09-27: qty 2

## 2020-09-27 NOTE — ED Triage Notes (Signed)
Pt state back pain X 2 weeks worse today at work, came home soaked in bath a took muscle relaxer and gotten worse since.

## 2020-09-27 NOTE — ED Provider Notes (Signed)
[facility] EMERGENCY DEPARTMENT Provider Note   CSN: [id number] Arrival date & time: 09/27/20  2013     History Chief Complaint  Patient presents with  . Back Pain    Jennifer Mcgrath is a 45 y.o. female.  Patient with a 2-week history of left flank pain.  Started from the left CVA area came the left flank and then went into the left lower quadrant.  Been worse the past few days no nausea no vomiting.  No history of kidney stones.  Patient states she is seeing a little bit of blood in her urine.  No radiation of the pain from the back into the buttocks or down the leg.  No numbness or weakness in the left foot.  No prior history of any back problems.  Symptoms were worse today at work.        Past Medical History:  Diagnosis Date  . GERD (gastroesophageal reflux disease)   . Hypertension   . PE (pulmonary thromboembolism) (HCC)     There are no problems to display for this patient.   Past Surgical History:  Procedure Laterality Date  . CESAREAN SECTION  1992, 1999, 2003, 2008  . TUBAL LIGATION       OB History   No obstetric history on file.     History reviewed. No pertinent family history.  Social History   Tobacco Use  . Smoking status: Never Smoker  . Smokeless tobacco: Never Used  Vaping Use  . Vaping Use: Never used  Substance Use Topics  . Alcohol use: Yes    Comment: occasional   . Drug use: No    Home Medications Prior to Admission medications   Medication Sig Start Date End Date Taking? Authorizing Provider  naproxen (NAPROSYN) 500 MG tablet Take 1 tablet (500 mg total) by mouth 2 (two) times daily. 09/28/20  Yes Vanetta MuldersZackowski, Ashlin Hidalgo, MD  amLODipine (NORVASC) 5 MG tablet Take 5 mg by mouth every morning. 09/16/17   [provider]  Aspirin-Acetaminophen-Caffeine (GOODY HEADACHE PO) Take 1 packet by mouth daily as needed.    [provider]  hydrochlorothiazide (MICROZIDE) 12.5 MG capsule Take 12.5 mg by mouth daily.     [provider]  linaclotide (LINZESS) 145 MCG CAPS capsule Take 145 mcg by mouth daily.    [provider]  loratadine (CLARITIN) 10 MG tablet Take 10 mg by mouth daily.    [provider]  meclizine (ANTIVERT) 25 MG tablet Take 1 tablet (25 mg total) by mouth 3 (three) times daily as needed for dizziness. 09/09/20   Benjiman CorePickering, Nathan, MD  methocarbamol (ROBAXIN) 500 MG tablet Take 1 tablet (500 mg total) by mouth 2 (two) times daily. 04/29/20   Derwood KaplanNanavati, Ankit, MD  metoCLOPramide (REGLAN) 10 MG tablet Take 1 tablet (10 mg total) by mouth every 6 (six) hours. 04/03/18   Cristina GongHammond, Elizabeth W, PA-C  NIFEdipine (PROCARDIA-XL/NIFEDICAL-XL) 30 MG 24 hr tablet TAKE 1 TABLET(30 MG) BY MOUTH DAILY 08/17/19   [provider]  omeprazole (PRILOSEC) 20 MG capsule Take 20 mg by mouth every morning.    [provider]  ondansetron (ZOFRAN ODT) 4 MG disintegrating tablet Take 1 tablet (4 mg total) by mouth every 8 (eight) hours as needed for nausea or vomiting. 04/03/18   Cristina GongHammond, Elizabeth W, PA-C  oxyCODONE-acetaminophen (PERCOCET/ROXICET) 5-325 MG tablet Take 1 tablet by mouth every 4 (four) hours as needed. 09/04/19   Ward, Layla MawKristen N, DO  pantoprazole (PROTONIX) 20 MG tablet Take  1 tablet (20 mg total) by mouth daily. 08/21/16   Arby Barrette, MD  potassium chloride SA (KLOR-CON) 20 MEQ tablet Take 2 tablets (40 mEq total) by mouth daily. 09/09/20   Benjiman Core, MD  rivaroxaban (XARELTO) 20 MG TABS tablet Take 20 mg by mouth daily with supper.    [provider]  Rivaroxaban 15 & 20 MG TBPK Follow package directions: Take one 15mg  tablet by mouth twice a day. On day 22, switch to one 20mg  tablet once a day. Take with food. Patient taking differently: Take 20 mg by mouth daily. 09/04/19   Ward, , DO    Allergies    Patient has no known allergies.  Review of Systems   Review of Systems  Constitutional: Negative for chills and fever.  HENT:  Negative for rhinorrhea and sore throat.   Eyes: Negative for visual disturbance.  Respiratory: Negative for cough and shortness of breath.   Cardiovascular: Negative for chest pain and leg swelling.  Gastrointestinal: Positive for abdominal pain. Negative for diarrhea, nausea and vomiting.  Genitourinary: Positive for flank pain and hematuria. Negative for dysuria.  Musculoskeletal: Positive for back pain. Negative for neck pain.  Skin: Negative for rash.  Neurological: Negative for dizziness, light-headedness and headaches.  Hematological: Does not bruise/bleed easily.  Psychiatric/Behavioral: Negative for confusion.    Physical Exam Updated Vital Signs BP 125/84   Pulse (!) 51   Temp 98.8 F (37.1 C) (Oral)   Resp 18   Ht 1.575 m (5\' 2" )   Wt 85.3 kg   LMP 09/14/2020   SpO2 96%   BMI 34.39 kg/m   Physical Exam Vitals and nursing note reviewed.  Constitutional:      General: She is not in acute distress.    Appearance: She is well-developed.  HENT:     Head: Normocephalic and atraumatic.  Eyes:     Extraocular Movements: Extraocular movements intact.     Pupils: Pupils are equal, round, and reactive to light.  Cardiovascular:     Rate and Rhythm: Normal rate and regular rhythm.     Heart sounds: No murmur heard.   Pulmonary:     Effort: Pulmonary effort is normal. No respiratory distress.     Breath sounds: Normal breath sounds.  Abdominal:     Palpations: Abdomen is soft.     Tenderness: There is no abdominal tenderness.     Comments: Abdomen nontender.  Musculoskeletal:        General: No tenderness.     Cervical back: Neck supple.     Comments: No tenderness to palpation to the buttocks area or the lumbar spine area.  Lower extremities dorsalis pedis pulses 2+.  Sensation intact good movement at the ankle and toes.  No increased back pain with range of motion of the left leg at the hip or knee.  Skin:    General: Skin is warm and dry.     Capillary Refill:  Capillary refill takes less than 2 seconds.  Neurological:     General: No focal deficit present.     Mental Status: She is alert and oriented to person, place, and time.     Cranial Nerves: No cranial nerve deficit.     Sensory: No sensory deficit.     Comments: No weakness or sensory deficit to the lower extremities.  Good cap refill.     ED Results / Procedures / Treatments   Labs (all labs ordered are listed, but only abnormal results  are displayed) Labs Reviewed  URINALYSIS, ROUTINE W REFLEX MICROSCOPIC - Abnormal; Notable for the following components:      Result Value   Hgb urine dipstick LARGE (*)    All other components within normal limits  CBC - Abnormal; Notable for the following components:   Hemoglobin 10.1 (*)    HCT 32.4 (*)    MCH 24.9 (*)    RDW 20.4 (*)    All other components within normal limits  BASIC METABOLIC PANEL - Abnormal; Notable for the following components:   Potassium 3.0 (*)    All other components within normal limits  URINALYSIS, MICROSCOPIC (REFLEX) - Abnormal; Notable for the following components:   Bacteria, UA RARE (*)    All other components within normal limits  URINE CULTURE  PREGNANCY, URINE    EKG None  Radiology CT Renal Stone Study  Result Date: 09/27/2020 CLINICAL DATA:  Two weeks of worsening left-sided back pain, kidney stone suspected EXAM: CT ABDOMEN AND PELVIS WITHOUT CONTRAST TECHNIQUE: Multidetector CT imaging of the abdomen and pelvis was performed following the standard protocol without IV contrast. COMPARISON:  April 03, 2018 FINDINGS: Lower chest: No acute abnormality. Normal size heart. No significant pericardial effusion/thickening. Small hiatal hernia. Hepatobiliary: Bilobar hypodense hepatic lesions which are too small to accurately characterize but favored represent cysts. Gallbladder is grossly unremarkable. No biliary ductal dilation. Pancreas: Unremarkable. No pancreatic ductal dilatation or surrounding  inflammatory changes. Spleen: Within normal limits. Adrenals/Urinary Tract: Bilateral adrenal glands are unremarkable. No hydronephrosis. No renal, ureteral or bladder calculi visualized. No contour deforming renal masses. Urinary bladder is grossly unremarkable for degree of distension. Stomach/Bowel: Small hiatal hernia otherwise the stomach is grossly unremarkable. No suspicious small bowel wall thickening or dilation. The appendix is grossly unremarkable. Pancolonic diverticulosis without findings of acute diverticulitis. Vascular/Lymphatic: No significant vascular findings are present. No enlarged abdominal or pelvic lymph nodes. Reproductive: Lobular appearance of the uterine contour likely representing leiomyomas. Left adnexa is unremarkable. 2 cm right ovarian cyst. No follow-up imaging recommended. Note: This recommendation does not apply to premenarchal patients and to those with increased risk (genetic, family history, elevated tumor markers or other high-risk factors) of ovarian cancer. Reference: JACR 2020 Feb; 17(2):248-254 Other: No abdominopelvic ascites. Mild diastasis rectus with a small fat containing ventral hernia. Musculoskeletal: No acute or significant osseous findings. IMPRESSION: 1. No acute abdominopelvic findings. Specifically, no evidence of obstructive uropathy. 2. Colonic diverticulosis without findings of acute diverticulitis. 3. Small hiatal hernia. Electronically Signed   By: Maudry Mayhew MD   On: 09/27/2020 22:41    Procedures Procedures   Medications Ordered in ED Medications  0.9 %  sodium chloride infusion ( Intravenous New Bag/Given 09/27/20 2208)  ondansetron (ZOFRAN) injection 4 mg (4 mg Intravenous Given 09/27/20 2200)  HYDROmorphone (DILAUDID) injection 1 mg (1 mg Intravenous Given 09/27/20 2200)    ED Course  I have reviewed the triage vital signs and the nursing notes.  Pertinent labs & imaging results that were available during my care of the patient were  reviewed by me and considered in my medical decision making (see chart for details).    MDM Rules/Calculators/A&P                          Work-up here positive for hematuria.  Patient states that she has had blood in her urine for long period of time.  No evidence of any stones on CT scan and abdomen was  negative.  Pregnancy test negative.  Labs without any significant abnormalities.  Urine was sent for culture.  Patient will follow up with primary care doctor.  Will treat with Naprosyn work note.  In the emergency department patient was given pain medication has a little bit of nausea related to that.  But her pain is much better. Final Clinical Impression(s) / ED Diagnoses Final diagnoses:  Flank pain  Acute left-sided low back pain without sciatica  Hematuria, unspecified type    Rx / DC Orders ED Discharge Orders         Ordered    naproxen (NAPROSYN) 500 MG tablet  2 times daily        09/28/20 0012           Vanetta Mulders, MD 09/28/20 [phone]

## 2020-09-28 ENCOUNTER — Other Ambulatory Visit: Payer: Self-pay | Admitting: Family

## 2020-09-28 ENCOUNTER — Inpatient Hospital Stay (HOSPITAL_BASED_OUTPATIENT_CLINIC_OR_DEPARTMENT_OTHER): Payer: Medicaid Other | Admitting: Family

## 2020-09-28 ENCOUNTER — Inpatient Hospital Stay: Payer: Medicaid Other | Attending: Family

## 2020-09-28 VITALS — BP 147/96 | HR 63 | Resp 18 | Ht 62.0 in | Wt 190.0 lb

## 2020-09-28 DIAGNOSIS — I2699 Other pulmonary embolism without acute cor pulmonale: Secondary | ICD-10-CM

## 2020-09-28 DIAGNOSIS — Z86711 Personal history of pulmonary embolism: Secondary | ICD-10-CM | POA: Insufficient documentation

## 2020-09-28 DIAGNOSIS — R600 Localized edema: Secondary | ICD-10-CM | POA: Diagnosis not present

## 2020-09-28 DIAGNOSIS — I10 Essential (primary) hypertension: Secondary | ICD-10-CM | POA: Insufficient documentation

## 2020-09-28 DIAGNOSIS — Z791 Long term (current) use of non-steroidal anti-inflammatories (NSAID): Secondary | ICD-10-CM | POA: Diagnosis not present

## 2020-09-28 DIAGNOSIS — K219 Gastro-esophageal reflux disease without esophagitis: Secondary | ICD-10-CM | POA: Insufficient documentation

## 2020-09-28 DIAGNOSIS — R202 Paresthesia of skin: Secondary | ICD-10-CM | POA: Insufficient documentation

## 2020-09-28 DIAGNOSIS — Z79899 Other long term (current) drug therapy: Secondary | ICD-10-CM | POA: Diagnosis not present

## 2020-09-28 LAB — CBC WITH DIFFERENTIAL (CANCER CENTER ONLY)
Abs Immature Granulocytes: 0.02 10*3/uL (ref 0.00–0.07)
Basophils Absolute: 0 10*3/uL (ref 0.0–0.1)
Basophils Relative: 0 %
Eosinophils Absolute: 0.1 10*3/uL (ref 0.0–0.5)
Eosinophils Relative: 1 %
HCT: 30.2 % — ABNORMAL LOW (ref 36.0–46.0)
Hemoglobin: 9.5 g/dL — ABNORMAL LOW (ref 12.0–15.0)
Immature Granulocytes: 0 %
Lymphocytes Relative: 35 %
Lymphs Abs: 2.3 10*3/uL (ref 0.7–4.0)
MCH: 24.8 pg — ABNORMAL LOW (ref 26.0–34.0)
MCHC: 31.5 g/dL (ref 30.0–36.0)
MCV: 78.9 fL — ABNORMAL LOW (ref 80.0–100.0)
Monocytes Absolute: 0.5 10*3/uL (ref 0.1–1.0)
Monocytes Relative: 7 %
Neutro Abs: 3.8 10*3/uL (ref 1.7–7.7)
Neutrophils Relative %: 57 %
Platelet Count: 336 10*3/uL (ref 150–400)
RBC: 3.83 MIL/uL — ABNORMAL LOW (ref 3.87–5.11)
RDW: 20.3 % — ABNORMAL HIGH (ref 11.5–15.5)
WBC Count: 6.8 10*3/uL (ref 4.0–10.5)
nRBC: 0 % (ref 0.0–0.2)

## 2020-09-28 LAB — CMP (CANCER CENTER ONLY)
ALT: 10 U/L (ref 0–44)
AST: 16 U/L (ref 15–41)
Albumin: 3.7 g/dL (ref 3.5–5.0)
Alkaline Phosphatase: 42 U/L (ref 38–126)
Anion gap: 7 (ref 5–15)
BUN: 14 mg/dL (ref 6–20)
CO2: 30 mmol/L (ref 22–32)
Calcium: 9.1 mg/dL (ref 8.9–10.3)
Chloride: 98 mmol/L (ref 98–111)
Creatinine: 0.81 mg/dL (ref 0.44–1.00)
GFR, Estimated: >60 mL/min (ref >60)
Glucose, Bld: 83 mg/dL (ref 70–99)
Potassium: 3 mmol/L — ABNORMAL LOW (ref 3.5–5.1)
Sodium: 135 mmol/L (ref 135–145)
Total Bilirubin: 0.4 mg/dL (ref 0.3–1.2)
Total Protein: 7.2 g/dL (ref 6.5–8.1)

## 2020-09-28 LAB — ANTITHROMBIN III: AntiThromb III Func: 104 % (ref 75–120)

## 2020-09-28 MED ORDER — NAPROXEN 500 MG PO TABS: 500.0000 mg | ORAL_TABLET | Freq: Two times a day (BID) | ORAL | 0 refills | Status: DC

## 2020-09-28 NOTE — Discharge Instructions (Signed)
Take the Naprosyn as needed for pain.  Work note provided to be out of work.  Make an appointment to follow-up with your primary care doctor.  Work-up here today without evidence of any kidney stones or any significant abnormalities.  You do have blood in your urine and urine was sent for urine culture.  But she states you have had blood in your urine for many years.  But it would be important to have your primary follow this up.  Return for any new or worse symptoms.

## 2020-09-28 NOTE — Progress Notes (Signed)
Hematology/Oncology Consultation   Name: Jennifer Mcgrath      MRN: [id number]    Location: Room/bed info not found  Date: 09/28/2020 Time:3:51 PM   REFERRING PHYSICIAN: Jackie Plum, MD  REASON FOR CONSULT: Bilateral pulmonary emboli   DIAGNOSIS: Bilateral pulmonary emboli - resolved on repeat CT angio 01/2020   HISTORY OF PRESENT ILLNESS: Ms. Jennifer Mcgrath is a very pleasant 45 yo African American female with history of bilateral pulmonary emboli. No prior history of thrombus.  She states that she had given birth to her baby girl on December 2020 and then in March 2021 developed persistent SOB and chest pain. CT angio confirmed bilateral PE. [place] of both lower extremities was negative for DVT.  Repeat CT scan in August 2021 showed resolution of pulmonary emboli.    She states that she has been on Xarelto since April 2021.  No hormone replacement therapy. No injury or illness.  No smoking, ETOH or recreational drug use.  No history of miscarriage.  She has 5 children and had a tubal ligation after her last delivery.  No family history of thrombotic event.  No sickle cell disease or trait.  She has HTN and is currently on Microzide and Nifedipine.  Her cycle this last month was quite heavy. No other blood loss noted. No abnormal bruising, no petechiae.  No history of diabetes or thyroid disease.  She has lower back pain and states that so far her work up as been negative.  No personal history of cancer. Family history includes one maternal uncle with brain cancer.  No fever, chills, n/v, cough, rash, dizziness, chest pain, palpitations, abdominal pain or changes in bowel or bladder habits.  She has mild SOB with over exertion at times and takes breaks to rest as needed.  No swelling, tenderness, numbness or tingling in her extremities at this time. She has intermittent swelling in her ankles that comes and goes.  Pedal pulses are 3+. She also has occasional tingling in her fingertips. She is unsure if  this is related to HTN.  She has a good appetite and is staying well hydrated. Her weight is stable.  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   ROS: All other 10 point review of systems is negative.   PAST MEDICAL HISTORY:   Past Medical History:  Diagnosis Date  . GERD (gastroesophageal reflux disease)   . Hypertension   . PE (pulmonary thromboembolism) (HCC)     ALLERGIES: No Known Allergies    MEDICATIONS:  Current Outpatient Medications on File Prior to Visit  Medication Sig Dispense Refill  . amLODipine (NORVASC) 5 MG tablet Take 5 mg by mouth every morning.  2  . Aspirin-Acetaminophen-Caffeine (GOODY HEADACHE PO) Take 1 packet by mouth daily as needed.    . hydrochlorothiazide (MICROZIDE) 12.5 MG capsule Take 12.5 mg by mouth daily.    Marland Kitchen linaclotide (LINZESS) 145 MCG CAPS capsule Take 145 mcg by mouth daily.    Marland Kitchen loratadine (CLARITIN) 10 MG tablet Take 10 mg by mouth daily.    . meclizine (ANTIVERT) 25 MG tablet Take 1 tablet (25 mg total) by mouth 3 (three) times daily as needed for dizziness. 10 tablet 0  . methocarbamol (ROBAXIN) 500 MG tablet Take 1 tablet (500 mg total) by mouth 2 (two) times daily. 10 tablet 0  . metoCLOPramide (REGLAN) 10 MG tablet Take 1 tablet (10 mg total) by mouth every 6 (six) hours. 20 tablet 0  . naproxen (NAPROSYN) 500 MG tablet  Take 1 tablet (500 mg total) by mouth 2 (two) times daily. 14 tablet 0  . NIFEdipine (PROCARDIA-XL/NIFEDICAL-XL) 30 MG 24 hr tablet TAKE 1 TABLET(30 MG) BY MOUTH DAILY    . omeprazole (PRILOSEC) 20 MG capsule Take 20 mg by mouth every morning.    . ondansetron (ZOFRAN ODT) 4 MG disintegrating tablet Take 1 tablet (4 mg total) by mouth every 8 (eight) hours as needed for nausea or vomiting. 10 tablet 0  . oxyCODONE-acetaminophen (PERCOCET/ROXICET) 5-325 MG tablet Take 1 tablet by mouth every 4 (four) hours as needed. 15 tablet 0  . pantoprazole (PROTONIX) 20 MG tablet Take 1 tablet (20 mg total) by mouth daily. 30  tablet 0  . potassium chloride SA (KLOR-CON) 20 MEQ tablet Take 2 tablets (40 mEq total) by mouth daily. 10 tablet 0  . rivaroxaban (XARELTO) 20 MG TABS tablet Take 20 mg by mouth daily with supper.    . Rivaroxaban 15 & 20 MG TBPK Follow package directions: Take one 15mg  tablet by mouth twice a day. On day 22, switch to one 20mg  tablet once a day. Take with food. (Patient taking differently: Take 20 mg by mouth daily.) 51 each 0   No current facility-administered medications on file prior to visit.     PAST SURGICAL HISTORY Past Surgical History:  Procedure Laterality Date  . CESAREAN SECTION  1992, 1999, 2003, 2008  . TUBAL LIGATION      FAMILY HISTORY: No family history on file.  SOCIAL HISTORY:  reports that she has never smoked. She has never used smokeless tobacco. She reports current alcohol use. She reports that she does not use drugs.  PERFORMANCE STATUS: The patient's performance status is 1 - Symptomatic but completely ambulatory  PHYSICAL EXAM: Most Recent Vital Signs: Blood pressure (!) 147/96, pulse 63, resp. rate 18, height 5\' 2"  (1.575 m), weight 190 lb (86.2 kg), last menstrual period 09/14/2020, SpO2 100 %. BP (!) 147/96 (BP Location: Left Arm, Patient Position: Sitting)   Pulse 63   Resp 18   Ht 5\' 2"  (1.575 m)   Wt 190 lb (86.2 kg)   LMP 09/14/2020   SpO2 100%   BMI 34.75 kg/m   General Appearance:    Alert, cooperative, no distress, appears stated age  Head:    Normocephalic, without obvious abnormality, atraumatic  Eyes:    PERRL, conjunctiva/corneas clear, EOM's intact, fundi    benign, both eyes        Throat:   Lips, mucosa, and tongue normal; teeth and gums normal  Neck:   Supple, symmetrical, trachea midline, no adenopathy;    thyroid:  no enlargement/tenderness/nodules; no carotid   bruit or JVD  Back:     Symmetric, no curvature, ROM normal, no CVA tenderness  Lungs:     Clear to auscultation bilaterally, respirations unlabored  Chest Wall:     No tenderness or deformity   Heart:    Regular rate and rhythm, S1 and S2 normal, no murmur, rub   or gallop     Abdomen:     Soft, non-tender, bowel sounds active all four quadrants,    no masses, no organomegaly        Extremities:   Extremities normal, atraumatic, no cyanosis or edema  Pulses:   2+ and symmetric all extremities  Skin:   Skin color, texture, turgor normal, no rashes or lesions  Lymph nodes:   Cervical, supraclavicular, and axillary nodes normal  Neurologic:   CNII-XII intact, normal strength,  sensation and reflexes    throughout    LABORATORY DATA:  Results for orders placed or performed in visit on 09/28/20 (from the past 48 hour(s))  CBC with Differential (Cancer Center Only)     Status: Abnormal   Collection Time: 09/28/20  2:55 PM  Result Value Ref Range   WBC Count 6.8 4.0 - 10.5 K/uL   RBC 3.83 (L) 3.87 - 5.11 MIL/uL   Hemoglobin 9.5 (L) 12.0 - 15.0 g/dL    Comment: Reticulocyte Hemoglobin testing may be clinically indicated, consider ordering this additional test [id number]    HCT 30.2 (L) 36.0 - 46.0 %   MCV 78.9 (L) 80.0 - 100.0 fL   MCH 24.8 (L) 26.0 - 34.0 pg   MCHC 31.5 30.0 - 36.0 g/dL   RDW 86.520.3 (H) 78.411.5 - 69.615.5 %   Platelet Count 336 150 - 400 K/uL   nRBC 0.0 0.0 - 0.2 %   Neutrophils Relative % 57 %   Neutro Abs 3.8 1.7 - 7.7 K/uL   Lymphocytes Relative 35 %   Lymphs Abs 2.3 0.7 - 4.0 K/uL   Monocytes Relative 7 %   Monocytes Absolute 0.5 0.1 - 1.0 K/uL   Eosinophils Relative 1 %   Eosinophils Absolute 0.1 0.0 - 0.5 K/uL   Basophils Relative 0 %   Basophils Absolute 0.0 0.0 - 0.1 K/uL   Immature Granulocytes 0 %   Abs Immature Granulocytes 0.02 0.00 - 0.07 K/uL    Comment: Performed at [facility] at [facility], [address], [place], [place] [address]  CMP (Cancer Center only)     Status: Abnormal   Collection Time: 09/28/20  2:55 PM  Result Value Ref Range   Sodium 135 135 - 145 mmol/L   Potassium  3.0 (L) 3.5 - 5.1 mmol/L   Chloride 98 98 - 111 mmol/L   CO2 30 22 - 32 mmol/L   Glucose, Bld 83 70 - 99 mg/dL    Comment: Glucose reference range applies only to samples taken after fasting for at least 8 hours.   BUN 14 6 - 20 mg/dL   Creatinine 4.130.81 2.440.44 - 1.00 mg/dL   Calcium 9.1 8.9 - 01.010.3 mg/dL   Total Protein 7.2 6.5 - 8.1 g/dL   Albumin 3.7 3.5 - 5.0 g/dL   AST 16 15 - 41 U/L   ALT 10 0 - 44 U/L   Alkaline Phosphatase 42 38 - 126 U/L   Total Bilirubin 0.4 0.3 - 1.2 mg/dL   GFR, Estimated >27>60 >25>60 mL/min    Comment: (NOTE) Calculated using the CKD-EPI Creatinine Equation (2021)    Anion gap 7 5 - 15    Comment: Performed at [facility] at [facility] [place], [address], [place], [place] [address]      RADIOGRAPHY: CT Renal Stone Study  Result Date: 09/27/2020 CLINICAL DATA:  Two weeks of worsening left-sided back pain, kidney stone suspected EXAM: CT ABDOMEN AND PELVIS WITHOUT CONTRAST TECHNIQUE: Multidetector CT imaging of the abdomen and pelvis was performed following the standard protocol without IV contrast. COMPARISON:  April 03, 2018 FINDINGS: Lower chest: No acute abnormality. Normal size heart. No significant pericardial effusion/thickening. Small hiatal hernia. Hepatobiliary: Bilobar hypodense hepatic lesions which are too small to accurately characterize but favored represent cysts. Gallbladder is grossly unremarkable. No biliary ductal dilation. Pancreas: Unremarkable. No pancreatic ductal dilatation or surrounding inflammatory changes. Spleen: Within normal limits. Adrenals/Urinary Tract:  Bilateral adrenal glands are unremarkable. No hydronephrosis. No renal, ureteral or bladder calculi visualized. No contour deforming renal masses. Urinary bladder is grossly unremarkable for degree of distension. Stomach/Bowel: Small hiatal hernia otherwise the stomach is grossly unremarkable. No suspicious small bowel wall thickening or dilation. The appendix is  grossly unremarkable. Pancolonic diverticulosis without findings of acute diverticulitis. Vascular/Lymphatic: No significant vascular findings are present. No enlarged abdominal or pelvic lymph nodes. Reproductive: Lobular appearance of the uterine contour likely representing leiomyomas. Left adnexa is unremarkable. 2 cm right ovarian cyst. No follow-up imaging recommended. Note: This recommendation does not apply to premenarchal patients and to those with increased risk (genetic, family history, elevated tumor markers or other high-risk factors) of ovarian cancer. Reference: JACR 2020 Feb; 17(2):248-254 Other: No abdominopelvic ascites. Mild diastasis rectus with a small fat containing ventral hernia. Musculoskeletal: No acute or significant osseous findings. IMPRESSION: 1. No acute abdominopelvic findings. Specifically, no evidence of obstructive uropathy. 2. Colonic diverticulosis without findings of acute diverticulitis. 3. Small hiatal hernia. Electronically Signed   By: Maudry Mayhew MD   On: 09/27/2020 22:41       PATHOLOGY:    ASSESSMENT/PLAN: Ms. Trower is a very pleasant 45 yo African American female with history of bilateral pulmonary emboli.  She has done well on Xarleto and repeat CT angio in August 2021 showed resolution of PE's.  Hyper coag panel was negative.  We will have her drop down to maintenance Xarelto 10 mg PO daily for one year and then she can stop.  Follow-up in 3 months.   All questions were answered and she is in agreement with the plan. The patient knows to call the clinic with any problems, questions or concerns. We can certainly see the patient much sooner if necessary.  The patient was discussed with Dr. Myna Hidalgo and he is in agreement with the aforementioned.   Emeline Gins, NP

## 2020-09-29 LAB — BETA-2-GLYCOPROTEIN I ABS, IGG/M/A
Beta-2 Glyco I IgG: <9 GPI IgG units (ref 0–20)
Beta-2-Glycoprotein I IgA: <9 GPI IgA units (ref 0–25)
Beta-2-Glycoprotein I IgM: <9 GPI IgM units (ref 0–32)

## 2020-09-29 LAB — PROTEIN C ACTIVITY: Protein C Activity: 129 % (ref 73–180)

## 2020-09-29 LAB — CARDIOLIPIN ANTIBODIES, IGG, IGM, IGA
Anticardiolipin IgA: <9 APL U/mL (ref 0–11)
Anticardiolipin IgG: 9 GPL U/mL (ref 0–14)
Anticardiolipin IgM: <9 MPL U/mL (ref 0–12)

## 2020-09-29 LAB — PROTEIN S ACTIVITY: Protein S Activity: 73 % (ref 63–140)

## 2020-09-29 LAB — PROTEIN S, TOTAL: Protein S Ag, Total: 84 % (ref 60–150)

## 2020-09-29 LAB — LUPUS ANTICOAGULANT PANEL
DRVVT: 38.4 s (ref 0.0–47.0)
PTT Lupus Anticoagulant: 34.2 s (ref 0.0–51.9)

## 2020-09-30 LAB — URINE CULTURE: Culture: >=100000 — AB

## 2020-09-30 LAB — PROTEIN C, TOTAL: Protein C, Total: 77 % (ref 60–150)

## 2020-10-01 ENCOUNTER — Telehealth: Payer: Self-pay | Admitting: Emergency Medicine

## 2020-10-01 ENCOUNTER — Telehealth: Payer: Self-pay

## 2020-10-01 LAB — HOMOCYSTEINE: Homocysteine: 9.2 umol/L (ref 0.0–14.5)

## 2020-10-01 NOTE — Telephone Encounter (Signed)
Post ED Visit - Positive Culture Follow-up  Culture report reviewed by antimicrobial stewardship pharmacist: [facility] []  , Pharm.D. []  Enzo Bi, Pharm.D., BCPS AQ-ID []  , Pharm.D., BCPS []  Celedonio Miyamoto, Pharm.D., BCPS []  [place], Garvin Fila.D., BCPS, AAHIVP []  , Pharm.D., BCPS, AAHIVP []  Georgina Pillion, PharmD, BCPS []  , PharmD, BCPS []  [place], PharmD, BCPS []  [place], PharmD []  , PharmD, BCPS []  Estella Husk, PharmD  Pharmacy Team []  Lysle Pearl, PharmD []  , PharmD []  Phillips Climes, PharmD []  , Rph []  Agapito Games) , PharmD []  Verlan Friends, PharmD []  , PharmD []  Mervyn Gay, PharmD []  , PharmD []  Vinnie Level, PharmD []  [facility], PharmD []  , PharmD []  Len Childs, PharmD   Positive urine culture Treated with none, asymptomatic,no further patient follow-up is required at this time.  10/01/2020, 12:26 PM

## 2020-10-01 NOTE — Telephone Encounter (Signed)
No 09/28/20 LOS   Jennifer Mcgrath

## 2020-10-04 LAB — FACTOR 5 LEIDEN

## 2020-10-04 LAB — PROTHROMBIN GENE MUTATION

## 2020-10-08 ENCOUNTER — Telehealth: Payer: Self-pay

## 2020-10-08 MED ORDER — RIVAROXABAN 10 MG PO TABS: 10.0000 mg | ORAL_TABLET | Freq: Every day | ORAL | 3 refills | Status: DC

## 2020-10-08 NOTE — Telephone Encounter (Signed)
Called and informed patient of lab results, and medication dose change patient verbalized understanding and denies any questions or concerns at this time. She understands scheduling will call her to make her next appt.

## 2020-10-08 NOTE — Telephone Encounter (Signed)
-----   Message from Verdie Mosher, NP sent at 10/08/2020  1:40 PM EDT ----- Hyper coag panel was negative!!! WOO HOO!!!!! We will now drop her down to maintenance dose Xarelto 10 mg PO daily for one year and then she can stop! Follow-up in 3 months! Thank you!    ----- Message ----- From: Interface, Lab In [place] Sent: 09/28/2020   6:48 PM EDT To: Verdie Mosher, NP

## 2020-12-28 ENCOUNTER — Inpatient Hospital Stay (HOSPITAL_BASED_OUTPATIENT_CLINIC_OR_DEPARTMENT_OTHER): Payer: Medicaid Other | Admitting: Family

## 2020-12-28 ENCOUNTER — Ambulatory Visit: Payer: Medicaid Other | Admitting: Nurse Practitioner

## 2020-12-28 ENCOUNTER — Other Ambulatory Visit: Payer: Self-pay

## 2020-12-28 ENCOUNTER — Ambulatory Visit: Payer: Medicaid Other | Admitting: Family

## 2020-12-28 ENCOUNTER — Other Ambulatory Visit: Payer: Medicaid Other

## 2020-12-28 ENCOUNTER — Encounter: Payer: Self-pay | Admitting: Family

## 2020-12-28 ENCOUNTER — Inpatient Hospital Stay: Payer: Medicaid Other | Attending: Family

## 2020-12-28 VITALS — BP 142/96 | HR 73 | Temp 98.5°F | Resp 18 | Ht 62.0 in | Wt 185.8 lb

## 2020-12-28 DIAGNOSIS — Z86711 Personal history of pulmonary embolism: Secondary | ICD-10-CM | POA: Diagnosis not present

## 2020-12-28 DIAGNOSIS — Z7901 Long term (current) use of anticoagulants: Secondary | ICD-10-CM | POA: Diagnosis not present

## 2020-12-28 DIAGNOSIS — I2699 Other pulmonary embolism without acute cor pulmonale: Secondary | ICD-10-CM

## 2020-12-28 LAB — CBC WITH DIFFERENTIAL (CANCER CENTER ONLY)
Abs Immature Granulocytes: 0 10*3/uL (ref 0.00–0.07)
Basophils Absolute: 0 10*3/uL (ref 0.0–0.1)
Basophils Relative: 0 %
Eosinophils Absolute: 0.2 10*3/uL (ref 0.0–0.5)
Eosinophils Relative: 4 %
HCT: 35.5 % — ABNORMAL LOW (ref 36.0–46.0)
Hemoglobin: 11.5 g/dL — ABNORMAL LOW (ref 12.0–15.0)
Immature Granulocytes: 0 %
Lymphocytes Relative: 45 %
Lymphs Abs: 2.1 10*3/uL (ref 0.7–4.0)
MCH: 27.1 pg (ref 26.0–34.0)
MCHC: 32.4 g/dL (ref 30.0–36.0)
MCV: 83.7 fL (ref 80.0–100.0)
Monocytes Absolute: 0.6 10*3/uL (ref 0.1–1.0)
Monocytes Relative: 12 %
Neutro Abs: 1.8 10*3/uL (ref 1.7–7.7)
Neutrophils Relative %: 39 %
Platelet Count: 320 10*3/uL (ref 150–400)
RBC: 4.24 MIL/uL (ref 3.87–5.11)
RDW: 13.9 % (ref 11.5–15.5)
WBC Count: 4.6 10*3/uL (ref 4.0–10.5)
nRBC: 0 % (ref 0.0–0.2)

## 2020-12-28 LAB — CMP (CANCER CENTER ONLY)
ALT: 14 U/L (ref 0–44)
AST: 19 U/L (ref 15–41)
Albumin: 3.8 g/dL (ref 3.5–5.0)
Alkaline Phosphatase: 49 U/L (ref 38–126)
Anion gap: 9 (ref 5–15)
BUN: 10 mg/dL (ref 6–20)
CO2: 27 mmol/L (ref 22–32)
Calcium: 9.1 mg/dL (ref 8.9–10.3)
Chloride: 102 mmol/L (ref 98–111)
Creatinine: 0.84 mg/dL (ref 0.44–1.00)
GFR, Estimated: >60 mL/min (ref >60)
Glucose, Bld: 89 mg/dL (ref 70–99)
Potassium: 3.1 mmol/L — ABNORMAL LOW (ref 3.5–5.1)
Sodium: 138 mmol/L (ref 135–145)
Total Bilirubin: 0.4 mg/dL (ref 0.3–1.2)
Total Protein: 7.9 g/dL (ref 6.5–8.1)

## 2020-12-28 NOTE — Progress Notes (Signed)
Hematology and Oncology Follow Up Visit  Jennifer Mcgrath [id number] Jan 08, 1976 45 y.o. 12/28/2020   Principle Diagnosis:  Bilateral pulmonary emboli - resolved on repeat CT angio 01/2020   Current Therapy:   Xarleto 10 mg PO daily maintenance - will complete in May 2023   Interim History:  Jennifer Mcgrath is here today for follow-up. She is doing well but does note fatigue and SOB at times. She states that she has GERD but has no refills left on her Protonix.  She has occasional muscle spasms in her arms and chest and states that she is also out of her potassium supplement that she takes with her Maxzide. She plans to contact her PCP for refills of KDUR and Protonix.  She has newly diagnosed vertigo after an episode n/v with dizziness.  No fever, chills, cough, rash, chest pain, palpitations, abdominal pain or changes in bowel or bladder habits.  Her cycle is heavy but she realized that the goody powders she was taking for cramping have aspirin in them so she has stopped. She is hoping this will help her next cycle to not be as heavy. No other blood loss noted. No bruising or petechiae.  No swelling, tenderness, numbness or tingling in her extremities at this time.  She has maintained a good appetite and is staying well hydrated. Her weight is stable at 185 lbs.   ECOG Performance Status: 1 - Symptomatic but completely ambulatory  Medications:  Allergies as of 12/28/2020   No Known Allergies      Medication List        Accurate as of December 28, 2020  2:03 PM. If you have any questions, ask your nurse or doctor.          STOP taking these medications    amLODipine 5 MG tablet Commonly known as: NORVASC Stopped by: Emeline Gins, NP   GOODY HEADACHE PO Stopped by: Emeline Gins, NP   linaclotide 145 MCG Caps capsule Commonly known as: IT consultant Stopped by: Emeline Gins, NP   loratadine 10 MG tablet Commonly known as: CLARITIN Stopped by: Emeline Gins, NP   meclizine  25 MG tablet Commonly known as: Pharmacist, community Stopped by: Emeline Gins, NP   methocarbamol 500 MG tablet Commonly known as: ROBAXIN Stopped by: Emeline Gins, NP   metoCLOPramide 10 MG tablet Commonly known as: REGLAN Stopped by: Emeline Gins, NP   naproxen 500 MG tablet Commonly known as: NAPROSYN Stopped by: Emeline Gins, NP   omeprazole 20 MG capsule Commonly known as: PRILOSEC Stopped by: Emeline Gins, NP   ondansetron 4 MG disintegrating tablet Commonly known as: Zofran ODT Stopped by: Emeline Gins, NP   oxyCODONE-acetaminophen 5-325 MG tablet Commonly known as: PERCOCET/ROXICET Stopped by: Emeline Gins, NP   pantoprazole 20 MG tablet Commonly known as: PROTONIX Stopped by: Emeline Gins, NP       TAKE these medications    hydrochlorothiazide 12.5 MG capsule Commonly known as: MICROZIDE Take 12.5 mg by mouth daily.   NIFEdipine 30 MG 24 hr tablet Commonly known as: PROCARDIA-XL/NIFEDICAL-XL TAKE 1 TABLET(30 MG) BY MOUTH DAILY   potassium chloride SA 20 MEQ tablet Commonly known as: KLOR-CON Take 2 tablets (40 mEq total) by mouth daily.   rivaroxaban 10 MG Tabs tablet Commonly known as: XARELTO Take 1 tablet (10 mg total) by mouth daily with supper.        Allergies: No Known Allergies  Past Medical History, Surgical history, Social history, and Family History were reviewed and updated.  Review  of Systems: All other 10 point review of systems is negative.   Physical Exam:  height is 5\' 2"  (1.575 m) and weight is 185 lb 12.8 oz (84.3 kg). Her oral temperature is 98.5 F (36.9 C). Her blood pressure is 142/96 (abnormal) and her pulse is 73. Her respiration is 18 and oxygen saturation is 99%.   Wt Readings from Last 3 Encounters:  12/28/20 185 lb 12.8 oz (84.3 kg)  09/28/20 190 lb (86.2 kg)  09/27/20 188 lb (85.3 kg)    Ocular: Sclerae unicteric, pupils equal, round and reactive to light Ear-nose-throat: Oropharynx  clear, dentition fair Lymphatic: No cervical or supraclavicular adenopathy Lungs no rales or rhonchi, good excursion bilaterally Heart regular rate and rhythm, no murmur appreciated Abd soft, nontender, positive bowel sounds MSK no focal spinal tenderness, no joint edema Neuro: non-focal, well-oriented, appropriate affect Breasts: Deferred   Lab Results  Component Value Date   WBC 4.6 12/28/2020   HGB 11.5 (L) 12/28/2020   HCT 35.5 (L) 12/28/2020   MCV 83.7 12/28/2020   PLT 320 12/28/2020   No results found for: FERRITIN, IRON, TIBC, UIBC, IRONPCTSAT Lab Results  Component Value Date   RBC 4.24 12/28/2020   No results found for: KPAFRELGTCHN, LAMBDASER, KAPLAMBRATIO No results found for: IGGSERUM, IGA, IGMSERUM No results found for: 12/30/2020, SPEI   Chemistry      Component Value Date/Time   NA 138 12/28/2020 1333   K 3.1 (L) 12/28/2020 1333   CL 102 12/28/2020 1333   CO2 27 12/28/2020 1333   BUN 10 12/28/2020 1333   CREATININE 0.84 12/28/2020 1333      Component Value Date/Time   CALCIUM 9.1 12/28/2020 1333   ALKPHOS 49 12/28/2020 1333   AST 19 12/28/2020 1333   ALT 14 12/28/2020 1333   BILITOT 0.4 12/28/2020 1333       Impression and Plan: Jennifer Mcgrath is a very pleasant 45 yo African American female with history of bilateral pulmonary emboli.  Hyper coag work up was negative. She is doing well on maintenance Xarelto 10 mg PO daily and will complete in MAy 2023.  She will continue to avoid NSAIDS and has stopped taking Goody powders.  Follow-up in 6 months.  She can contact our office with any questions or concerns.    2024, NP 7/29/20222:03 PM

## 2020-12-31 ENCOUNTER — Telehealth: Payer: Self-pay | Admitting: *Deleted

## 2020-12-31 NOTE — Telephone Encounter (Signed)
Per 12/28/20 los - called and gave upcoming appointments - confirmed and mailed calendar

## 2021-02-15 ENCOUNTER — Other Ambulatory Visit: Payer: Self-pay | Admitting: Family

## 2021-02-15 DIAGNOSIS — I2699 Other pulmonary embolism without acute cor pulmonale: Secondary | ICD-10-CM

## 2021-07-01 ENCOUNTER — Inpatient Hospital Stay: Payer: Medicaid Other | Attending: Hematology & Oncology

## 2021-07-01 ENCOUNTER — Inpatient Hospital Stay: Payer: Medicaid Other | Admitting: Family

## 2021-09-11 ENCOUNTER — Encounter (HOSPITAL_BASED_OUTPATIENT_CLINIC_OR_DEPARTMENT_OTHER): Payer: Self-pay

## 2021-09-11 ENCOUNTER — Emergency Department (HOSPITAL_BASED_OUTPATIENT_CLINIC_OR_DEPARTMENT_OTHER)
Admission: EM | Admit: 2021-09-11 | Discharge: 2021-09-11 | Disposition: A | Discharge status: Home / Self Care | Payer: Medicaid Other | Attending: Emergency Medicine | Admitting: Emergency Medicine

## 2021-09-11 ENCOUNTER — Emergency Department (HOSPITAL_BASED_OUTPATIENT_CLINIC_OR_DEPARTMENT_OTHER): Payer: Medicaid Other

## 2021-09-11 ENCOUNTER — Other Ambulatory Visit: Payer: Self-pay

## 2021-09-11 DIAGNOSIS — G43509 Persistent migraine aura without cerebral infarction, not intractable, without status migrainosus: Secondary | ICD-10-CM | POA: Insufficient documentation

## 2021-09-11 DIAGNOSIS — R519 Headache, unspecified: Secondary | ICD-10-CM | POA: Diagnosis present

## 2021-09-11 DIAGNOSIS — G43519 Persistent migraine aura without cerebral infarction, intractable, without status migrainosus: Secondary | ICD-10-CM

## 2021-09-11 DIAGNOSIS — Z20822 Contact with and (suspected) exposure to covid-19: Secondary | ICD-10-CM | POA: Insufficient documentation

## 2021-09-11 DIAGNOSIS — I10 Essential (primary) hypertension: Secondary | ICD-10-CM | POA: Insufficient documentation

## 2021-09-11 LAB — HEPATIC FUNCTION PANEL
ALT: 14 U/L (ref 0–44)
AST: 24 U/L (ref 15–41)
Albumin: 3.4 g/dL — ABNORMAL LOW (ref 3.5–5.0)
Alkaline Phosphatase: 51 U/L (ref 38–126)
Bilirubin, Direct: 0.1 mg/dL (ref 0.0–0.2)
Indirect Bilirubin: 0.4 mg/dL (ref 0.3–0.9)
Total Bilirubin: 0.5 mg/dL (ref 0.3–1.2)
Total Protein: 7.5 g/dL (ref 6.5–8.1)

## 2021-09-11 LAB — CBC WITH DIFFERENTIAL/PLATELET
Abs Immature Granulocytes: 0.01 10*3/uL (ref 0.00–0.07)
Basophils Absolute: 0 10*3/uL (ref 0.0–0.1)
Basophils Relative: 1 %
Eosinophils Absolute: 0.1 10*3/uL (ref 0.0–0.5)
Eosinophils Relative: 2 %
HCT: 34 % — ABNORMAL LOW (ref 36.0–46.0)
Hemoglobin: 10.6 g/dL — ABNORMAL LOW (ref 12.0–15.0)
Immature Granulocytes: 0 %
Lymphocytes Relative: 41 %
Lymphs Abs: 2.2 10*3/uL (ref 0.7–4.0)
MCH: 25.6 pg — ABNORMAL LOW (ref 26.0–34.0)
MCHC: 31.2 g/dL (ref 30.0–36.0)
MCV: 82.1 fL (ref 80.0–100.0)
Monocytes Absolute: 0.4 10*3/uL (ref 0.1–1.0)
Monocytes Relative: 8 %
Neutro Abs: 2.6 10*3/uL (ref 1.7–7.7)
Neutrophils Relative %: 48 %
Platelets: 362 10*3/uL (ref 150–400)
RBC: 4.14 MIL/uL (ref 3.87–5.11)
RDW: 19.4 % — ABNORMAL HIGH (ref 11.5–15.5)
WBC: 5.3 10*3/uL (ref 4.0–10.5)
nRBC: 0 % (ref 0.0–0.2)

## 2021-09-11 LAB — BASIC METABOLIC PANEL
Anion gap: 8 (ref 5–15)
BUN: 9 mg/dL (ref 6–20)
CO2: 29 mmol/L (ref 22–32)
Calcium: 8.8 mg/dL — ABNORMAL LOW (ref 8.9–10.3)
Chloride: 103 mmol/L (ref 98–111)
Creatinine, Ser: 0.83 mg/dL (ref 0.44–1.00)
GFR, Estimated: >60 mL/min (ref >60)
Glucose, Bld: 110 mg/dL — ABNORMAL HIGH (ref 70–99)
Potassium: 3 mmol/L — ABNORMAL LOW (ref 3.5–5.1)
Sodium: 140 mmol/L (ref 135–145)

## 2021-09-11 LAB — TROPONIN I (HIGH SENSITIVITY): Troponin I (High Sensitivity): 4 ng/L (ref <18)

## 2021-09-11 LAB — RESP PANEL BY RT-PCR (FLU A&B, COVID) ARPGX2
Influenza A by PCR: NEGATIVE
Influenza B by PCR: NEGATIVE
SARS Coronavirus 2 by RT PCR: NEGATIVE

## 2021-09-11 LAB — HCG, SERUM, QUALITATIVE: Preg, Serum: NEGATIVE

## 2021-09-11 MED ORDER — DIPHENHYDRAMINE HCL 50 MG/ML IJ SOLN
25.0000 mg | Freq: Once | INTRAMUSCULAR | Status: AC
  Administered 2021-09-11: 25 mg via INTRAVENOUS
  Filled 2021-09-11: qty 1

## 2021-09-11 MED ORDER — SODIUM CHLORIDE 0.9 % IV BOLUS
1000.0000 mL | Freq: Once | INTRAVENOUS | Status: AC
  Administered 2021-09-11: 1000 mL via INTRAVENOUS

## 2021-09-11 MED ORDER — POTASSIUM CHLORIDE CRYS ER 20 MEQ PO TBCR
40.0000 meq | EXTENDED_RELEASE_TABLET | Freq: Two times a day (BID) | ORAL | Status: DC
  Administered 2021-09-11: 40 meq via ORAL
  Filled 2021-09-11: qty 2

## 2021-09-11 MED ORDER — METOCLOPRAMIDE HCL 5 MG/ML IJ SOLN
10.0000 mg | Freq: Once | INTRAMUSCULAR | Status: AC
  Administered 2021-09-11: 10 mg via INTRAVENOUS
  Filled 2021-09-11: qty 2

## 2021-09-11 MED ORDER — KETOROLAC TROMETHAMINE 30 MG/ML IJ SOLN
30.0000 mg | Freq: Once | INTRAMUSCULAR | Status: AC
  Administered 2021-09-11: 30 mg via INTRAVENOUS
  Filled 2021-09-11: qty 1

## 2021-09-11 NOTE — Discharge Instructions (Addendum)
Please follow up with your PCP next week to discuss symptoms and how you are doing. ?You can start taking two potassium tablets instead of just one starting tomorrow morning. ? ?Please return if your symptoms get worse. ?

## 2021-09-11 NOTE — ED Provider Notes (Signed)
?[facility] EMERGENCY DEPARTMENT ?Provider Note ? ? ?CSN: [id number] ?Arrival date & time: 09/11/21  1206 ? ?  ? ?History ?PMH: Hypertension, pulmonary embolism, not on anticoagulation, GERD. ?Chief Complaint  ?Patient presents with  ? Headache  ? ? ?Jennifer Mcgrath is a 46 y.o. female. ?Presents to the ED with a chief complaint of headache.  She states that for the past 2 days she has noticed left-sided headache and neck pain, and decreased hearing out of her left ear.  She says she has associated subjective left-sided weakness, blurry vision, and chest tightness, and balance issues.  She says these symptoms persisted until today when she just felt like she started feeling weakness in her entire body.  She says the last time she had the symptoms was about a year ago when she was seen in the ED and she was diagnosed as vertigo.  She says she is not on her Xarelto anymore as of about 4 months ago.  She states that she self discontinued this. ? ? ?Headache ?Associated symptoms: dizziness, nausea, photophobia and weakness   ?Associated symptoms: no numbness   ? ?  ? ?Home Medications ?Prior to Admission medications   ?Medication Sig Start Date End Date Taking? Authorizing Provider  ?hydrochlorothiazide (MICROZIDE) 12.5 MG capsule Take 12.5 mg by mouth daily.    [provider]  ?NIFEdipine (PROCARDIA-XL/NIFEDICAL-XL) 30 MG 24 hr tablet TAKE 1 TABLET(30 MG) BY MOUTH DAILY 08/17/19   [provider]  ?potassium chloride SA (KLOR-CON) 20 MEQ tablet Take 2 tablets (40 mEq total) by mouth daily. 09/09/20   Benjiman Core, MD  ?Carlena Hurl 10 MG TABS tablet TAKE 1 TABLET(10 MG) BY MOUTH DAILY WITH SUPPER 02/15/21   Josph Macho, MD  ?   ? ?Allergies    ?Patient has no known allergies.   ? ?Review of Systems   ?Review of Systems  ?Eyes:  Positive for photophobia and visual disturbance.  ?Cardiovascular:  Positive for chest pain.  ?Gastrointestinal:  Positive for nausea.  ?Neurological:  Positive for  dizziness, weakness and headaches. Negative for speech difficulty and numbness.  ? ?Physical Exam ?Updated Vital Signs ?BP (!) 154/102 (BP Location: Right Arm)   Pulse 88   Temp (!) 97.3 ?F (36.3 ?C) (Oral)   Resp 19   Ht 5\' 2"  (1.575 m)   Wt 82.6 kg   LMP 08/31/2021 (Approximate)   SpO2 100%   BMI 33.29 kg/m?  ?Physical Exam ?Vitals and nursing note reviewed.  ?Constitutional:   ?   General: She is not in acute distress. ?   Appearance: Normal appearance. She is not ill-appearing, toxic-appearing or diaphoretic.  ?HENT:  ?   Head: Normocephalic and atraumatic.  ?   Nose: No nasal deformity.  ?   Mouth/Throat:  ?   Lips: Pink. No lesions.  ?   Mouth: Mucous membranes are moist. No injury, lacerations, oral lesions or angioedema.  ?   Pharynx: Oropharynx is clear. Uvula midline. No pharyngeal swelling, oropharyngeal exudate, posterior oropharyngeal erythema or uvula swelling.  ?Eyes:  ?   General: Gaze aligned appropriately. No scleral icterus.    ?   Right eye: No discharge.     ?   Left eye: No discharge.  ?   Conjunctiva/sclera: Conjunctivae normal.  ?   Right eye: Right conjunctiva is not injected. No exudate or hemorrhage. ?   Left eye: Left conjunctiva is not injected. No exudate or hemorrhage. ?   Pupils: Pupils are equal, round, and  reactive to light.  ?Cardiovascular:  ?   Rate and Rhythm: Normal rate and regular rhythm.  ?   Pulses: Normal pulses.     ?     Radial pulses are 2+ on the right side and 2+ on the left side.  ?     Dorsalis pedis pulses are 2+ on the right side and 2+ on the left side.  ?   Heart sounds: Normal heart sounds, S1 normal and S2 normal. Heart sounds not distant. No murmur heard. ?  No friction rub. No gallop. No S3 or S4 sounds.  ?Pulmonary:  ?   Effort: Pulmonary effort is normal. No accessory muscle usage or respiratory distress.  ?   Breath sounds: Normal breath sounds. No stridor. No wheezing, rhonchi or rales.  ?Chest:  ?   Chest wall: No tenderness.  ?Abdominal:  ?    General: Abdomen is flat. Bowel sounds are normal. There is no distension.  ?   Palpations: Abdomen is soft. There is no mass or pulsatile mass.  ?   Tenderness: There is no abdominal tenderness. There is no guarding or rebound.  ?Musculoskeletal:  ?   Right lower leg: No edema.  ?   Left lower leg: No edema.  ?Skin: ?   General: Skin is warm and dry.  ?   Coloration: Skin is not jaundiced or pale.  ?   Findings: No bruising, erythema, lesion or rash.  ?Neurological:  ?   General: No focal deficit present.  ?   Mental Status: She is alert and oriented to person, place, and time.  ?   GCS: GCS eye subscore is 4. GCS verbal subscore is 5. GCS motor subscore is 6.  ?   Comments: Alert and Oriented x 3 ?Speech clear with no aphasia ?Cranial Nerve testing ?- PERRLA. EOM intact. No Nystagmus ?- Facial Sensation grossly intact ?- No facial asymmetry ?- Uvula and Tongue Midline ?- Accessory Muscles intact ?Motor: ?- 5/5 motor strength in all four extremities.  ?- No pronator Drift ?- Normal tone ?Sensation: ?- Grossly intact in all four extremities.  ?Coordination:  ?- Finger to nose and heel to shin intact bilaterally ?- Gait without abnormality. ?  ?Psychiatric:     ?   Mood and Affect: Mood normal.     ?   Behavior: Behavior normal. Behavior is cooperative.  ? ? ?ED Results / Procedures / Treatments   ?Labs ?(all labs ordered are listed, but only abnormal results are displayed) ?Labs Reviewed  ?CBC WITH DIFFERENTIAL/PLATELET - Abnormal; Notable for the following components:  ?    Result Value  ? Hemoglobin 10.6 (*)   ? HCT 34.0 (*)   ? MCH 25.6 (*)   ? RDW 19.4 (*)   ? All other components within normal limits  ?BASIC METABOLIC PANEL - Abnormal; Notable for the following components:  ? Potassium 3.0 (*)   ? Glucose, Bld 110 (*)   ? Calcium 8.8 (*)   ? All other components within normal limits  ?HEPATIC FUNCTION PANEL - Abnormal; Notable for the following components:  ? Albumin 3.4 (*)   ? All other components within  normal limits  ?RESP PANEL BY RT-PCR (FLU A&B, COVID) ARPGX2  ?HCG, SERUM, QUALITATIVE  ?CBG MONITORING, ED  ?TROPONIN I (HIGH SENSITIVITY)  ?TROPONIN I (HIGH SENSITIVITY)  ? ? ?EKG ?None ? ?Radiology ?DG Chest 2 View ? ?Result Date: 09/11/2021 ?CLINICAL DATA:  Chest pain. EXAM: CHEST - 2 VIEW COMPARISON:  Chest x-ray and chest CT 01/15/2020 FINDINGS: The cardiac silhouette, mediastinal and hilar contours are normal. The lungs are clear. No pleural effusions. The bony thorax is intact. IMPRESSION: No acute cardiopulmonary findings. Electronically Signed   By: Marijo Sanes M.D.   On: 09/11/2021 15:44  ? ?CT Head Wo Contrast ? ?Result Date: 09/11/2021 ?CLINICAL DATA:  Worsening headache. EXAM: CT HEAD WITHOUT CONTRAST TECHNIQUE: Contiguous axial images were obtained from the base of the skull through the vertex without intravenous contrast. RADIATION DOSE REDUCTION: This exam was performed according to the departmental dose-optimization program which includes automated exposure control, adjustment of the mA and/or kV according to patient size and/or use of iterative reconstruction technique. COMPARISON:  Head CT 09/09/2020 FINDINGS: Brain: The ventricles are normal in size and configuration. No extra-axial fluid collections are identified. The gray-white differentiation is maintained. No CT findings for acute hemispheric infarction or intracranial hemorrhage. No mass lesions. The brainstem and cerebellum are normal. Vascular: No hyperdense vessels or obvious aneurysm. Skull: No acute skull fracture.  No bone lesion. Sinuses/Orbits: The paranasal sinuses and mastoid air cells are clear. The globes are intact. Other: No scalp lesions, laceration or hematoma. IMPRESSION: No acute intracranial findings or mass lesions. Electronically Signed   By: Marijo Sanes M.D.   On: 09/11/2021 15:57   ? ?Procedures ?Procedures  ? ? ?Medications Ordered in ED ?Medications  ?potassium chloride SA (KLOR-CON M) CR tablet 40 mEq (40 mEq  Oral Given 09/11/21 1634)  ?ketorolac (TORADOL) 30 MG/ML injection 30 mg (30 mg Intravenous Given 09/11/21 1516)  ?metoCLOPramide (REGLAN) injection 10 mg (10 mg Intravenous Given 09/11/21 1517)  ?diphenhydrAMINE (BENADRY

## 2021-09-11 NOTE — ED Triage Notes (Signed)
C/o left sided body pain x 1 week. Takes ibuprofen with relief. States difficulty hearing out of left ear and headache with neck & back pain. Blurred vision, left sided numbness/chest tightness x 2 days. ?

## 2021-09-11 NOTE — ED Notes (Signed)
Extra dark green and blue tube sent to lab to hold ?

## 2021-09-11 NOTE — ED Notes (Signed)
ED Provider at bedside. 

## 2021-09-11 NOTE — ED Notes (Signed)
Patient transported to CT 

## 2021-12-03 ENCOUNTER — Emergency Department (HOSPITAL_COMMUNITY): Payer: Medicaid Other

## 2021-12-03 ENCOUNTER — Emergency Department (HOSPITAL_COMMUNITY)
Admission: EM | Admit: 2021-12-03 | Discharge: 2021-12-03 | Disposition: A | Discharge status: Home / Self Care | Payer: Medicaid Other | Attending: Emergency Medicine | Admitting: Emergency Medicine

## 2021-12-03 ENCOUNTER — Other Ambulatory Visit: Payer: Self-pay

## 2021-12-03 ENCOUNTER — Encounter (HOSPITAL_COMMUNITY): Payer: Self-pay

## 2021-12-03 DIAGNOSIS — R55 Syncope and collapse: Secondary | ICD-10-CM | POA: Insufficient documentation

## 2021-12-03 DIAGNOSIS — E876 Hypokalemia: Secondary | ICD-10-CM | POA: Insufficient documentation

## 2021-12-03 DIAGNOSIS — T673XXA Heat exhaustion, anhydrotic, initial encounter: Secondary | ICD-10-CM | POA: Insufficient documentation

## 2021-12-03 DIAGNOSIS — T675XXA Heat exhaustion, unspecified, initial encounter: Secondary | ICD-10-CM

## 2021-12-03 LAB — CBC
HCT: 37.1 % (ref 36.0–46.0)
Hemoglobin: 11.7 g/dL — ABNORMAL LOW (ref 12.0–15.0)
MCH: 27.7 pg (ref 26.0–34.0)
MCHC: 31.5 g/dL (ref 30.0–36.0)
MCV: 87.9 fL (ref 80.0–100.0)
Platelets: 327 10*3/uL (ref 150–400)
RBC: 4.22 MIL/uL (ref 3.87–5.11)
RDW: 13.8 % (ref 11.5–15.5)
WBC: 6.6 10*3/uL (ref 4.0–10.5)
nRBC: 0 % (ref 0.0–0.2)

## 2021-12-03 LAB — DIFFERENTIAL
Abs Immature Granulocytes: 0.01 10*3/uL (ref 0.00–0.07)
Basophils Absolute: 0 10*3/uL (ref 0.0–0.1)
Basophils Relative: 0 %
Eosinophils Absolute: 0.1 10*3/uL (ref 0.0–0.5)
Eosinophils Relative: 1 %
Immature Granulocytes: 0 %
Lymphocytes Relative: 28 %
Lymphs Abs: 1.9 10*3/uL (ref 0.7–4.0)
Monocytes Absolute: 0.5 10*3/uL (ref 0.1–1.0)
Monocytes Relative: 8 %
Neutro Abs: 4.1 10*3/uL (ref 1.7–7.7)
Neutrophils Relative %: 63 %

## 2021-12-03 LAB — ETHANOL: Alcohol, Ethyl (B): <10 mg/dL (ref <10)

## 2021-12-03 LAB — PROTIME-INR
INR: 1.1 (ref 0.8–1.2)
Prothrombin Time: 14 seconds (ref 11.4–15.2)

## 2021-12-03 LAB — COMPREHENSIVE METABOLIC PANEL
ALT: 15 U/L (ref 0–44)
AST: 19 U/L (ref 15–41)
Albumin: 3.3 g/dL — ABNORMAL LOW (ref 3.5–5.0)
Alkaline Phosphatase: 50 U/L (ref 38–126)
Anion gap: 5 (ref 5–15)
BUN: 13 mg/dL (ref 6–20)
CO2: 28 mmol/L (ref 22–32)
Calcium: 8.5 mg/dL — ABNORMAL LOW (ref 8.9–10.3)
Chloride: 105 mmol/L (ref 98–111)
Creatinine, Ser: 1 mg/dL (ref 0.44–1.00)
GFR, Estimated: >60 mL/min (ref >60)
Glucose, Bld: 77 mg/dL (ref 70–99)
Potassium: 3 mmol/L — ABNORMAL LOW (ref 3.5–5.1)
Sodium: 138 mmol/L (ref 135–145)
Total Bilirubin: 0.6 mg/dL (ref 0.3–1.2)
Total Protein: 7.1 g/dL (ref 6.5–8.1)

## 2021-12-03 LAB — I-STAT BETA HCG BLOOD, ED (MC, WL, AP ONLY): I-stat hCG, quantitative: <5 m[IU]/mL (ref <5)

## 2021-12-03 LAB — APTT: aPTT: 31 seconds (ref 24–36)

## 2021-12-03 MED ORDER — POTASSIUM CHLORIDE CRYS ER 20 MEQ PO TBCR
40.0000 meq | EXTENDED_RELEASE_TABLET | Freq: Once | ORAL | Status: AC
  Administered 2021-12-03: 40 meq via ORAL
  Filled 2021-12-03: qty 2

## 2021-12-03 MED ORDER — SODIUM CHLORIDE 0.9 % IV SOLN
100.0000 mL/h | INTRAVENOUS | Status: DC
  Administered 2021-12-03: 100 mL/h via INTRAVENOUS

## 2021-12-03 MED ORDER — SODIUM CHLORIDE 0.9 % IV BOLUS
500.0000 mL | Freq: Once | INTRAVENOUS | Status: AC
  Administered 2021-12-03: 500 mL via INTRAVENOUS

## 2021-12-03 NOTE — ED Provider Notes (Signed)
She is [facility] EMERGENCY DEPARTMENT Provider Note   CSN: [id number] Arrival date & time: 12/03/21  1610     History    Jennifer Mcgrath is a 46 y.o. female.  HPI Patient states she has been outside for a significant portion of the day for the fourth July festivities.  Patient feels like she was starting to get overheated.  She had been trying to drink fluids.  Patient states she started feeling tightness in her mouth she also felt like she could not move her right arm.  She did feel somewhat weak on the other side but definitely the right was worse.  Patient started feeling tingling.  EMS was called.  Patient felt like she was going to pass out.  She was helped to the ground.  Patient states once EMS got her into a cooler area her symptoms all resolved.  She now feels completely back to her baseline.  She thinks there is a chance she might of gotten overheated    Home Medications Prior to Admission medications   Medication Sig Start Date End Date Taking? Authorizing Provider  hydrochlorothiazide (MICROZIDE) 12.5 MG capsule Take 12.5 mg by mouth daily.    [provider]  NIFEdipine (PROCARDIA-XL/NIFEDICAL-XL) 30 MG 24 hr tablet TAKE 1 TABLET(30 MG) BY MOUTH DAILY 08/17/19   [provider]  potassium chloride SA (KLOR-CON) 20 MEQ tablet Take 2 tablets (40 mEq total) by mouth daily. 09/09/20   Benjiman Core, MD  XARELTO 10 MG TABS tablet TAKE 1 TABLET(10 MG) BY MOUTH DAILY WITH SUPPER 02/15/21   Josph Macho, MD      Allergies    Patient has no known allergies.    Review of Systems   Review of Systems  Constitutional:  Negative for fever.    Physical Exam Updated Vital Signs BP (!) 146/89   Pulse 78   Temp 98.4 F (36.9 C) (Oral)   Resp 17   Ht 1.575 m (5\' 2" )   Wt 82.1 kg   SpO2 100%   BMI 33.11 kg/m  Physical Exam Vitals and nursing note reviewed.  Constitutional:      General: She is not in acute distress.    Appearance: She  is well-developed.  HENT:     Head: Normocephalic and atraumatic.     Right Ear: External ear normal.     Left Ear: External ear normal.  Eyes:     General: No scleral icterus.       Right eye: No discharge.        Left eye: No discharge.     Conjunctiva/sclera: Conjunctivae normal.  Neck:     Trachea: No tracheal deviation.  Cardiovascular:     Rate and Rhythm: Normal rate and regular rhythm.  Pulmonary:     Effort: Pulmonary effort is normal. No respiratory distress.     Breath sounds: Normal breath sounds. No stridor. No wheezing or rales.  Abdominal:     General: Bowel sounds are normal. There is no distension.     Palpations: Abdomen is soft.     Tenderness: There is no abdominal tenderness. There is no guarding or rebound.  Musculoskeletal:        General: No tenderness or deformity.     Cervical back: Neck supple.  Skin:    General: Skin is warm and dry.     Findings: No rash.  Neurological:     General: No focal deficit present.     Mental  Status: She is alert.     Cranial Nerves: No cranial nerve deficit (no facial droop, extraocular movements intact, no slurred speech).     Sensory: No sensory deficit.     Motor: No abnormal muscle tone or seizure activity.     Coordination: Coordination normal.     Comments: No facial droop, equal grip strength bilaterally, normal plantarflexion bilaterally, normal sensation throughout, no aphasia  Psychiatric:        Mood and Affect: Mood normal.     ED Results / Procedures / Treatments   Labs (all labs ordered are listed, but only abnormal results are displayed) Labs Reviewed  CBC - Abnormal; Notable for the following components:      Result Value   Hemoglobin 11.7 (*)    All other components within normal limits  COMPREHENSIVE METABOLIC PANEL - Abnormal; Notable for the following components:   Potassium 3.0 (*)    Calcium 8.5 (*)    Albumin 3.3 (*)    All other components within normal limits  ETHANOL  PROTIME-INR   APTT  DIFFERENTIAL  I-STAT BETA HCG BLOOD, ED ([facility], [facility], [facility] ONLY)    EKG EKG Interpretation  Date/Time:  Tuesday December 03 2021 16:39:56 EDT Ventricular Rate:  71 PR Interval:  172 QRS Duration: 86 QT Interval:  412 QTC Calculation: 447 R Axis:   76 Text Interpretation: Normal sinus rhythm Cannot rule out Anterior infarct , age undetermined Abnormal ECG When compared with ECG of 11-Sep-2021 12:26, PREVIOUS ECG IS PRESENT Since last tracing rate faster Confirmed by Linwood Dibbles [phone]) on 12/03/2021 4:57:10 PM  Radiology CT HEAD WO CONTRAST  Result Date: 12/03/2021 CLINICAL DATA:  Right-sided weakness, dizziness, chest tightness and near-syncope EXAM: CT HEAD WITHOUT CONTRAST TECHNIQUE: Contiguous axial images were obtained from the base of the skull through the vertex without intravenous contrast. RADIATION DOSE REDUCTION: This exam was performed according to the departmental dose-optimization program which includes automated exposure control, adjustment of the mA and/or kV according to patient size and/or use of iterative reconstruction technique. COMPARISON:  CT head 09/12/2018 FINDINGS: Brain: There is no acute intracranial hemorrhage, extra-axial fluid collection, or acute infarct. Parenchymal volume is normal. The ventricles are normal in size. Gray-white differentiation is preserved There is no mass lesion. There is no mass effect or midline shift. A mildly expanded and partially empty sella is unchanged. Vascular: No hyperdense vessel or unexpected calcification. Skull: Normal. Negative for fracture or focal lesion. Sinuses/Orbits: The imaged paranasal sinuses are clear. Globes and orbits are unremarkable. Other: None. IMPRESSION: No acute intracranial pathology. Electronically Signed   By: Lesia Hausen M.D.   On: 12/03/2021 18:24    Procedures Procedures    Medications Ordered in ED Medications  sodium chloride 0.9 % bolus 500 mL (0 mLs Intravenous Stopped 12/03/21 1845)    Followed by   0.9 %  sodium chloride infusion (0 mL/hr Intravenous Stopped 12/03/21 1846)  potassium chloride SA (KLOR-CON M) CR tablet 40 mEq (has no administration in time range)    ED Course/ Medical Decision Making/ A&P Clinical Course as of 12/03/21 1902  Tue Dec 03, 2021  1900 CT HEAD WO CONTRAST Head CT images and radiology report reviewed.  No acute findings [JK]  1900 CBC(!) Mild anemia noted, similar to previous values [JK]  1900 Comprehensive metabolic panel(!) Hypokalemia noted, patient has a history of this, noted on previous values [JK]    Clinical Course User Index [JK] Linwood Dibbles, MD  Medical Decision Making Frontal diagnosis includes but not limited to heat exhaustion, syncope, TIA  Problems Addressed: Heat exhaustion, initial encounter: acute illness or injury that poses a threat to life or bodily functions Hypokalemia: chronic illness or injury Syncope and collapse: acute illness or injury  Amount and/or Complexity of Data Reviewed External Data Reviewed: labs.    Details: Previous laboratory results reviewed for comparison Labs: ordered. Decision-making details documented in ED Course. Radiology: ordered and independent interpretation performed. Decision-making details documented in ED Course.  Risk Prescription drug management.   Patient presented to the ED for evaluation of a syncopal event.  Suspect symptoms related to heat oxygen.  She did have symptoms more on the right side of her body but she also had global weakness in addition to her dizziness and chest tightness.  No signs of any EKG abnormalities.  Patient's symptoms have resolved resolved.  I doubt ACS PE stroke TIA.  Temperature outside today was very hot and the immunity was very high.  Patient had been outside for a while.  All her symptoms resolved when she was brought into the cool air.  Patient has remained asymptomatic during her ED stay.  Evaluation and diagnostic testing in the  emergency department does not suggest an emergent condition requiring admission or immediate intervention beyond what has been performed at this time.  The patient is safe for discharge and has been instructed to return immediately for worsening symptoms, change in symptoms or any other concerns.         Final Clinical Impression(s) / ED Diagnoses Final diagnoses:  Syncope and collapse  Heat exhaustion, initial encounter  Hypokalemia    Rx / DC Orders ED Discharge Orders     None         Linwood Dibbles, MD 12/03/21 1902

## 2021-12-03 NOTE — Discharge Instructions (Signed)
Try to stay well-hydrated, avoid the heat.  Return to the emergency room for any recurrent symptoms

## 2021-12-03 NOTE — ED Triage Notes (Signed)
Pt arrived via EMS, was in sun since 1300 and developed R sided weakness, dizziness, chest tightness and near syncope. Reswolved upon arrival except slight chest tightness

## 2021-12-12 ENCOUNTER — Emergency Department (HOSPITAL_BASED_OUTPATIENT_CLINIC_OR_DEPARTMENT_OTHER)
Admission: EM | Admit: 2021-12-12 | Discharge: 2021-12-13 | Disposition: A | Discharge status: Home / Self Care | Payer: Medicaid Other | Attending: Emergency Medicine | Admitting: Emergency Medicine

## 2021-12-12 ENCOUNTER — Encounter (HOSPITAL_BASED_OUTPATIENT_CLINIC_OR_DEPARTMENT_OTHER): Payer: Self-pay | Admitting: Emergency Medicine

## 2021-12-12 ENCOUNTER — Other Ambulatory Visit: Payer: Self-pay

## 2021-12-12 DIAGNOSIS — Z79899 Other long term (current) drug therapy: Secondary | ICD-10-CM | POA: Diagnosis not present

## 2021-12-12 DIAGNOSIS — Z7901 Long term (current) use of anticoagulants: Secondary | ICD-10-CM | POA: Insufficient documentation

## 2021-12-12 DIAGNOSIS — I10 Essential (primary) hypertension: Secondary | ICD-10-CM | POA: Insufficient documentation

## 2021-12-12 DIAGNOSIS — K5909 Other constipation: Secondary | ICD-10-CM | POA: Insufficient documentation

## 2021-12-12 DIAGNOSIS — R1030 Lower abdominal pain, unspecified: Secondary | ICD-10-CM | POA: Diagnosis present

## 2021-12-12 LAB — URINALYSIS, ROUTINE W REFLEX MICROSCOPIC
Bilirubin Urine: NEGATIVE
Glucose, UA: NEGATIVE mg/dL
Ketones, ur: NEGATIVE mg/dL
Nitrite: NEGATIVE
Protein, ur: NEGATIVE mg/dL
Specific Gravity, Urine: 1.01 (ref 1.005–1.030)
pH: 6 (ref 5.0–8.0)

## 2021-12-12 LAB — URINALYSIS, MICROSCOPIC (REFLEX)

## 2021-12-12 LAB — PREGNANCY, URINE: Preg Test, Ur: NEGATIVE

## 2021-12-12 MED ORDER — FENTANYL CITRATE PF 50 MCG/ML IJ SOSY
50.0000 ug | PREFILLED_SYRINGE | Freq: Once | INTRAMUSCULAR | Status: AC
  Administered 2021-12-13: 50 ug via INTRAVENOUS
  Filled 2021-12-12: qty 1

## 2021-12-12 MED ORDER — SODIUM CHLORIDE 0.9 % IV BOLUS
1000.0000 mL | Freq: Once | INTRAVENOUS | Status: AC
  Administered 2021-12-13: 1000 mL via INTRAVENOUS

## 2021-12-12 MED ORDER — ONDANSETRON HCL 4 MG/2ML IJ SOLN
4.0000 mg | Freq: Once | INTRAMUSCULAR | Status: AC
  Administered 2021-12-13: 4 mg via INTRAVENOUS
  Filled 2021-12-12: qty 2

## 2021-12-12 NOTE — ED Triage Notes (Signed)
Patient c/o abdominal cramping for the last 2 days, states she has been constipated and reports vomiting.

## 2021-12-12 NOTE — ED Provider Notes (Signed)
[facility] Provider Note: Lowella Dell, MD, FACEP  CSN: [id number] MRN: [id number] ARRIVAL: 12/12/21 at 2124 ROOM: MH11/MH11   CHIEF COMPLAINT  Abdominal Pain   HISTORY OF PRESENT ILLNESS  12/12/21 11:45 PM Jennifer Mcgrath is a 47 y.o. female with a history of chronic constipation for at least the past 4 to 8 months.  She usually passes only small feces.  Today she reports increased constipation with lower abdominal pain.  The abdominal pain has both constant and intermittent components.  She rates it as an 8 out of 10.  It is worse when she tries to move her bowels.  She has had associated nausea and vomiting with this as well.  She feels like her abdomen is bloated.   Past Medical History:  Diagnosis Date   GERD (gastroesophageal reflux disease)    Hypertension    PE (pulmonary thromboembolism) [facility])     Past Surgical History:  Procedure Laterality Date   CESAREAN SECTION  1992, 1999, 2003, 2008   TUBAL LIGATION      History reviewed. No pertinent family history.  Social History   Tobacco Use   Smoking status: Never   Smokeless tobacco: Never  Vaping Use   Vaping Use: Never used  Substance Use Topics   Alcohol use: Yes    Comment: occasional    Drug use: No    Prior to Admission medications   Medication Sig Start Date End Date Taking? Authorizing Provider  hydrochlorothiazide (MICROZIDE) 12.5 MG capsule Take 12.5 mg by mouth daily.    [provider]  NIFEdipine (PROCARDIA-XL/NIFEDICAL-XL) 30 MG 24 hr tablet TAKE 1 TABLET(30 MG) BY MOUTH DAILY 08/17/19   [provider]  potassium chloride SA (KLOR-CON) 20 MEQ tablet Take 2 tablets (40 mEq total) by mouth daily. 09/09/20   Benjiman Core, MD  XARELTO 10 MG TABS tablet TAKE 1 TABLET(10 MG) BY MOUTH DAILY WITH SUPPER 02/15/21   Josph Macho, MD    Allergies Patient has no known allergies.   REVIEW OF SYSTEMS  Negative except as noted here or in the History of Present  Illness.   PHYSICAL EXAMINATION  Initial Vital Signs Blood pressure (!) 130/100, pulse 93, temperature 99.3 F (37.4 C), temperature source Oral, resp. rate 18, height 5\' 2"  (1.575 m), weight 81 kg, last menstrual period 11/24/2021, SpO2 95 %.  Examination General: Well-developed, well-nourished female in no acute distress; appearance consistent with age of record HENT: normocephalic; atraumatic Eyes: Normal appearance Neck: supple Heart: regular rate and rhythm Lungs: clear to auscultation bilaterally Abdomen: soft; nondistended; lower abdominal tenderness; bowel sounds hypoactive Extremities: No deformity; full range of motion; pulses normal Neurologic: Awake, alert and oriented; motor function intact in all extremities and symmetric; no facial droop Skin: Warm and dry Psychiatric: Normal mood and affect   RESULTS  Summary of this visit's results, reviewed and interpreted by myself:   EKG Interpretation  Date/Time:    Ventricular Rate:    PR Interval:    QRS Duration:   QT Interval:    QTC Calculation:   R Axis:     Text Interpretation:         Laboratory Studies: Results for orders placed or performed during the hospital encounter of 12/12/21 (from the past 24 hour(s))  Pregnancy, urine     Status: None   Collection Time: 12/12/21 10:00 PM  Result Value Ref Range   Preg Test, Ur NEGATIVE NEGATIVE  Urinalysis, Routine w reflex microscopic Urine, Clean Catch  Status: Abnormal   Collection Time: 12/12/21 10:00 PM  Result Value Ref Range   Color, Urine YELLOW YELLOW   APPearance CLEAR CLEAR   Specific Gravity, Urine 1.010 1.005 - 1.030   pH 6.0 5.0 - 8.0   Glucose, UA NEGATIVE NEGATIVE mg/dL   Hgb urine dipstick MODERATE (A) NEGATIVE   Bilirubin Urine NEGATIVE NEGATIVE   Ketones, ur NEGATIVE NEGATIVE mg/dL   Protein, ur NEGATIVE NEGATIVE mg/dL   Nitrite NEGATIVE NEGATIVE   Leukocytes,Ua TRACE (A) NEGATIVE  Urinalysis, Microscopic (reflex)     Status:  Abnormal   Collection Time: 12/12/21 10:00 PM  Result Value Ref Range   RBC / HPF 6-10 0 - 5 RBC/hpf   WBC, UA 6-10 0 - 5 WBC/hpf   Bacteria, UA RARE (A) NONE SEEN   Squamous Epithelial / LPF 0-5 0 - 5  CBC with Differential     Status: Abnormal   Collection Time: 12/13/21 12:20 AM  Result Value Ref Range   WBC 7.3 4.0 - 10.5 K/uL   RBC 3.99 3.87 - 5.11 MIL/uL   Hemoglobin 11.2 (L) 12.0 - 15.0 g/dL   HCT 84.1 (L) 32.4 - 40.1 %   MCV 86.0 80.0 - 100.0 fL   MCH 28.1 26.0 - 34.0 pg   MCHC 32.7 30.0 - 36.0 g/dL   RDW 02.7 25.3 - 66.4 %   Platelets 324 150 - 400 K/uL   nRBC 0.0 0.0 - 0.2 %   Neutrophils Relative % 50 %   Neutro Abs 3.7 1.7 - 7.7 K/uL   Lymphocytes Relative 39 %   Lymphs Abs 2.8 0.7 - 4.0 K/uL   Monocytes Relative 9 %   Monocytes Absolute 0.7 0.1 - 1.0 K/uL   Eosinophils Relative 1 %   Eosinophils Absolute 0.1 0.0 - 0.5 K/uL   Basophils Relative 1 %   Basophils Absolute 0.0 0.0 - 0.1 K/uL   Immature Granulocytes 0 %   Abs Immature Granulocytes 0.02 0.00 - 0.07 K/uL  Basic metabolic panel     Status: Abnormal   Collection Time: 12/13/21 12:20 AM  Result Value Ref Range   Sodium 137 135 - 145 mmol/L   Potassium 3.1 (L) 3.5 - 5.1 mmol/L   Chloride 104 98 - 111 mmol/L   CO2 27 22 - 32 mmol/L   Glucose, Bld 101 (H) 70 - 99 mg/dL   BUN 13 6 - 20 mg/dL   Creatinine, Ser 4.03 0.44 - 1.00 mg/dL   Calcium 8.7 (L) 8.9 - 10.3 mg/dL   GFR, Estimated >47 >42 mL/min   Anion gap 6 5 - 15   Imaging Studies: CT ABDOMEN PELVIS W CONTRAST  Result Date: 12/13/2021 CLINICAL DATA:  Right lower quadrant abdominal pain. EXAM: CT ABDOMEN AND PELVIS WITH CONTRAST TECHNIQUE: Multidetector CT imaging of the abdomen and pelvis was performed using the standard protocol following bolus administration of intravenous contrast. RADIATION DOSE REDUCTION: This exam was performed according to the departmental dose-optimization program which includes automated exposure control, adjustment of the  mA and/or kV according to patient size and/or use of iterative reconstruction technique. CONTRAST:  OMNIPAQUE IOHEXOL 300 MG/ML  SOLN COMPARISON:  CT abdomen pelvis dated 09/27/2020. FINDINGS: Lower chest: The visualized lung bases are clear. No intra-abdominal free air or free fluid. Hepatobiliary: Several small scattered hepatic hypodense lesions are too small to characterize. No intrahepatic biliary dilatation. The gallbladder is unremarkable Pancreas: Unremarkable. No pancreatic ductal dilatation or surrounding inflammatory changes. Spleen: Normal in size without  focal abnormality. Adrenals/Urinary Tract: The adrenal glands unremarkable. There is mild fullness of the right renal collecting system. The left kidney is unremarkable. The visualized ureters and urinary bladder appear unremarkable. Stomach/Bowel: There is sigmoid diverticulosis and scattered colonic diverticula without active inflammatory changes. There is no bowel obstruction or active inflammation. The appendix is normal. Vascular/Lymphatic: The abdominal aorta and IVC are unremarkable. No portal venous gas. There is no adenopathy. Reproductive: The uterus is anteverted. C-section scar noted. No adnexal masses. Other: Small fat containing umbilical hernia. Musculoskeletal: No acute or significant osseous findings. IMPRESSION: 1. No acute intra-abdominal or pelvic pathology. Normal appendix. 2. Colonic diverticulosis. Electronically Signed   By: Elgie Collard M.D.   On: 12/13/2021 01:34    ED COURSE and MDM  Nursing notes, initial and subsequent vitals signs, including pulse oximetry, reviewed and interpreted by myself.  Vitals:   12/12/21 2155 12/12/21 2156 12/13/21 0015 12/13/21 0030  BP: (!) 130/100  124/83 121/87  Pulse: 93  83 81  Resp: 18  17   Temp: 99.3 F (37.4 C)     TempSrc: Oral     SpO2: 95%  99% 97%  Weight:  81 kg    Height:  5\' 2"  (1.575 m)     Medications  sodium chloride 0.9 % bolus 1,000 mL (1,000 mLs  Intravenous New Bag/Given 12/13/21 0005)  ondansetron (ZOFRAN) injection 4 mg (4 mg Intravenous Given 12/13/21 0008)  fentaNYL (SUBLIMAZE) injection 50 mcg (50 mcg Intravenous Given 12/13/21 0009)  iohexol (OMNIPAQUE) 300 MG/ML solution 100 mL (100 mLs Intravenous Contrast Given 12/13/21 0106)   The patient has only been taking fiber supplements for constipation.  She was advised she may benefit from MiraLAX.  There is no CT evidence of any significant acute pathology.  We will refer to gastroenterology if symptoms persist.   PROCEDURES  Procedures   ED DIAGNOSES     ICD-10-CM   1. Chronic constipation  K59.09          Rebecca Cairns, 12/15/21, MD 12/13/21 [phone]

## 2021-12-13 ENCOUNTER — Emergency Department (HOSPITAL_BASED_OUTPATIENT_CLINIC_OR_DEPARTMENT_OTHER): Payer: Medicaid Other

## 2021-12-13 LAB — BASIC METABOLIC PANEL
Anion gap: 6 (ref 5–15)
BUN: 13 mg/dL (ref 6–20)
CO2: 27 mmol/L (ref 22–32)
Calcium: 8.7 mg/dL — ABNORMAL LOW (ref 8.9–10.3)
Chloride: 104 mmol/L (ref 98–111)
Creatinine, Ser: 0.7 mg/dL (ref 0.44–1.00)
GFR, Estimated: >60 mL/min (ref >60)
Glucose, Bld: 101 mg/dL — ABNORMAL HIGH (ref 70–99)
Potassium: 3.1 mmol/L — ABNORMAL LOW (ref 3.5–5.1)
Sodium: 137 mmol/L (ref 135–145)

## 2021-12-13 LAB — CBC WITH DIFFERENTIAL/PLATELET
Abs Immature Granulocytes: 0.02 10*3/uL (ref 0.00–0.07)
Basophils Absolute: 0 10*3/uL (ref 0.0–0.1)
Basophils Relative: 1 %
Eosinophils Absolute: 0.1 10*3/uL (ref 0.0–0.5)
Eosinophils Relative: 1 %
HCT: 34.3 % — ABNORMAL LOW (ref 36.0–46.0)
Hemoglobin: 11.2 g/dL — ABNORMAL LOW (ref 12.0–15.0)
Immature Granulocytes: 0 %
Lymphocytes Relative: 39 %
Lymphs Abs: 2.8 10*3/uL (ref 0.7–4.0)
MCH: 28.1 pg (ref 26.0–34.0)
MCHC: 32.7 g/dL (ref 30.0–36.0)
MCV: 86 fL (ref 80.0–100.0)
Monocytes Absolute: 0.7 10*3/uL (ref 0.1–1.0)
Monocytes Relative: 9 %
Neutro Abs: 3.7 10*3/uL (ref 1.7–7.7)
Neutrophils Relative %: 50 %
Platelets: 324 10*3/uL (ref 150–400)
RBC: 3.99 MIL/uL (ref 3.87–5.11)
RDW: 13.4 % (ref 11.5–15.5)
WBC: 7.3 10*3/uL (ref 4.0–10.5)
nRBC: 0 % (ref 0.0–0.2)

## 2021-12-13 MED ORDER — IOHEXOL 300 MG/ML  SOLN
100.0000 mL | Freq: Once | INTRAMUSCULAR | Status: AC | PRN
  Administered 2021-12-13: 100 mL via INTRAVENOUS

## 2022-05-19 ENCOUNTER — Encounter (HOSPITAL_BASED_OUTPATIENT_CLINIC_OR_DEPARTMENT_OTHER): Payer: Self-pay | Admitting: Urology

## 2022-05-19 ENCOUNTER — Other Ambulatory Visit: Payer: Self-pay

## 2022-05-19 ENCOUNTER — Emergency Department (HOSPITAL_BASED_OUTPATIENT_CLINIC_OR_DEPARTMENT_OTHER): Payer: Medicaid Other

## 2022-05-19 DIAGNOSIS — R519 Headache, unspecified: Secondary | ICD-10-CM | POA: Diagnosis not present

## 2022-05-19 DIAGNOSIS — H538 Other visual disturbances: Secondary | ICD-10-CM | POA: Insufficient documentation

## 2022-05-19 DIAGNOSIS — R42 Dizziness and giddiness: Secondary | ICD-10-CM | POA: Insufficient documentation

## 2022-05-19 DIAGNOSIS — M542 Cervicalgia: Secondary | ICD-10-CM | POA: Insufficient documentation

## 2022-05-19 DIAGNOSIS — Z5321 Procedure and treatment not carried out due to patient leaving prior to being seen by health care provider: Secondary | ICD-10-CM | POA: Insufficient documentation

## 2022-05-19 DIAGNOSIS — Z20822 Contact with and (suspected) exposure to covid-19: Secondary | ICD-10-CM | POA: Insufficient documentation

## 2022-05-19 DIAGNOSIS — R202 Paresthesia of skin: Secondary | ICD-10-CM | POA: Diagnosis not present

## 2022-05-19 DIAGNOSIS — R0789 Other chest pain: Secondary | ICD-10-CM | POA: Insufficient documentation

## 2022-05-19 LAB — RESP PANEL BY RT-PCR (RSV, FLU A&B, COVID)  RVPGX2
Influenza A by PCR: NEGATIVE
Influenza B by PCR: NEGATIVE
Resp Syncytial Virus by PCR: NEGATIVE
SARS Coronavirus 2 by RT PCR: NEGATIVE

## 2022-05-19 LAB — BASIC METABOLIC PANEL
Anion gap: 6 (ref 5–15)
BUN: 15 mg/dL (ref 6–20)
CO2: 26 mmol/L (ref 22–32)
Calcium: 8.6 mg/dL — ABNORMAL LOW (ref 8.9–10.3)
Chloride: 106 mmol/L (ref 98–111)
Creatinine, Ser: 0.95 mg/dL (ref 0.44–1.00)
GFR, Estimated: >60 mL/min (ref >60)
Glucose, Bld: 92 mg/dL (ref 70–99)
Potassium: 3.3 mmol/L — ABNORMAL LOW (ref 3.5–5.1)
Sodium: 138 mmol/L (ref 135–145)

## 2022-05-19 LAB — TROPONIN I (HIGH SENSITIVITY): Troponin I (High Sensitivity): 4 ng/L (ref <18)

## 2022-05-19 LAB — CBC
HCT: 27.9 % — ABNORMAL LOW (ref 36.0–46.0)
Hemoglobin: 8.7 g/dL — ABNORMAL LOW (ref 12.0–15.0)
MCH: 25.2 pg — ABNORMAL LOW (ref 26.0–34.0)
MCHC: 31.2 g/dL (ref 30.0–36.0)
MCV: 80.9 fL (ref 80.0–100.0)
Platelets: 364 10*3/uL (ref 150–400)
RBC: 3.45 MIL/uL — ABNORMAL LOW (ref 3.87–5.11)
RDW: 15.9 % — ABNORMAL HIGH (ref 11.5–15.5)
WBC: 6 10*3/uL (ref 4.0–10.5)
nRBC: 0 % (ref 0.0–0.2)

## 2022-05-19 NOTE — ED Notes (Signed)
Patient transported to X-ray 

## 2022-05-19 NOTE — ED Triage Notes (Signed)
Pt states left sided neck and head pain, dizziness, tongue tingling and left hand tingling that started 2 hours ago  States vision blurry as well  States chest tightness

## 2022-05-20 ENCOUNTER — Emergency Department (HOSPITAL_BASED_OUTPATIENT_CLINIC_OR_DEPARTMENT_OTHER)
Admission: EM | Admit: 2022-05-20 | Discharge: 2022-05-20 | Discharge status: Against Medical Advice | Payer: Medicaid Other | Attending: Emergency Medicine | Admitting: Emergency Medicine

## 2022-05-20 NOTE — ED Notes (Signed)
Per registration pt left

## 2022-07-01 ENCOUNTER — Encounter (HOSPITAL_BASED_OUTPATIENT_CLINIC_OR_DEPARTMENT_OTHER): Payer: Self-pay

## 2022-07-01 ENCOUNTER — Emergency Department (HOSPITAL_BASED_OUTPATIENT_CLINIC_OR_DEPARTMENT_OTHER)
Admission: EM | Admit: 2022-07-01 | Discharge: 2022-07-01 | Disposition: A | Discharge status: Home / Self Care | Payer: Medicaid Other | Attending: Emergency Medicine | Admitting: Emergency Medicine

## 2022-07-01 ENCOUNTER — Other Ambulatory Visit: Payer: Self-pay

## 2022-07-01 ENCOUNTER — Emergency Department (HOSPITAL_BASED_OUTPATIENT_CLINIC_OR_DEPARTMENT_OTHER): Payer: Medicaid Other

## 2022-07-01 DIAGNOSIS — Z1152 Encounter for screening for COVID-19: Secondary | ICD-10-CM | POA: Diagnosis not present

## 2022-07-01 DIAGNOSIS — Z79899 Other long term (current) drug therapy: Secondary | ICD-10-CM | POA: Diagnosis not present

## 2022-07-01 DIAGNOSIS — I10 Essential (primary) hypertension: Secondary | ICD-10-CM | POA: Insufficient documentation

## 2022-07-01 DIAGNOSIS — R519 Headache, unspecified: Secondary | ICD-10-CM

## 2022-07-01 DIAGNOSIS — N898 Other specified noninflammatory disorders of vagina: Secondary | ICD-10-CM | POA: Diagnosis not present

## 2022-07-01 DIAGNOSIS — R079 Chest pain, unspecified: Secondary | ICD-10-CM | POA: Diagnosis not present

## 2022-07-01 LAB — CBC
HCT: 30.9 % — ABNORMAL LOW (ref 36.0–46.0)
Hemoglobin: 9.7 g/dL — ABNORMAL LOW (ref 12.0–15.0)
MCH: 24.6 pg — ABNORMAL LOW (ref 26.0–34.0)
MCHC: 31.4 g/dL (ref 30.0–36.0)
MCV: 78.4 fL — ABNORMAL LOW (ref 80.0–100.0)
Platelets: 360 10*3/uL (ref 150–400)
RBC: 3.94 MIL/uL (ref 3.87–5.11)
RDW: 17 % — ABNORMAL HIGH (ref 11.5–15.5)
WBC: 5.4 10*3/uL (ref 4.0–10.5)
nRBC: 0 % (ref 0.0–0.2)

## 2022-07-01 LAB — WET PREP, GENITAL
Clue Cells Wet Prep HPF POC: NONE SEEN
Sperm: NONE SEEN
Trich, Wet Prep: NONE SEEN
WBC, Wet Prep HPF POC: <10 (ref <10)
Yeast Wet Prep HPF POC: NONE SEEN

## 2022-07-01 LAB — BASIC METABOLIC PANEL
Anion gap: 9 (ref 5–15)
BUN: 17 mg/dL (ref 6–20)
CO2: 25 mmol/L (ref 22–32)
Calcium: 8.9 mg/dL (ref 8.9–10.3)
Chloride: 102 mmol/L (ref 98–111)
Creatinine, Ser: 0.89 mg/dL (ref 0.44–1.00)
GFR, Estimated: >60 mL/min (ref >60)
Glucose, Bld: 97 mg/dL (ref 70–99)
Potassium: 3.5 mmol/L (ref 3.5–5.1)
Sodium: 136 mmol/L (ref 135–145)

## 2022-07-01 LAB — RESP PANEL BY RT-PCR (RSV, FLU A&B, COVID)  RVPGX2
Influenza A by PCR: NEGATIVE
Influenza B by PCR: NEGATIVE
Resp Syncytial Virus by PCR: NEGATIVE
SARS Coronavirus 2 by RT PCR: NEGATIVE

## 2022-07-01 LAB — PREGNANCY, URINE: Preg Test, Ur: NEGATIVE

## 2022-07-01 LAB — D-DIMER, QUANTITATIVE: D-Dimer, Quant: 0.4 ug/mL-FEU (ref 0.00–0.50)

## 2022-07-01 LAB — TROPONIN I (HIGH SENSITIVITY): Troponin I (High Sensitivity): 3 ng/L (ref <18)

## 2022-07-01 NOTE — Discharge Instructions (Signed)
Please read and follow all provided instructions.  Your diagnoses today include:  1. Chest pain, unspecified type   2. Acute nonintractable headache, unspecified headache type   3. Vaginal discharge     Tests performed today include: An EKG of your heart A chest x-ray Cardiac enzymes: a blood test for heart muscle damage D-dimer: Screening test for blood clot was negative Blood counts and electrolytes Wet prep: No signs of vaginal infection Vital signs. See below for your results today.   Medications prescribed:  Please use over-the-counter NSAID medications (ibuprofen, naproxen) or Tylenol (acetaminophen) as directed on the packaging for pain -- as long as you do not have any reasons avoid these medications. Reasons to avoid NSAID medications include: weak kidneys, a history of bleeding in your stomach or gut, or uncontrolled high blood pressure or previous heart attack. Reasons to avoid Tylenol include: liver problems or ongoing alcohol use. Never take more than 4000mg  or 8 Extra strength Tylenol in a 24 hour period.     Take any prescribed medications only as directed.  Follow-up instructions: Please follow-up with your primary care provider as soon as you can for further evaluation of your symptoms.   Return instructions:  SEEK IMMEDIATE MEDICAL ATTENTION IF: You have severe chest pain, especially if the pain is crushing or pressure-like and spreads to the arms, back, neck, or jaw, or if you have sweating, nausea or vomiting, or trouble with breathing. THIS IS AN EMERGENCY. Do not wait to see if the pain will go away. Get medical help at once. Call 911. DO NOT drive yourself to the hospital.  Your chest pain gets worse and does not go away after a few minutes of rest.  You have an attack of chest pain lasting longer than what you usually experience.  You have significant dizziness, if you pass out, or have trouble walking.  You have chest pain not typical of your usual pain for  which you originally saw your caregiver.  You have any other emergent concerns regarding your health.  Additional Information: Chest pain comes from many different causes. Your caregiver has diagnosed you as having chest pain that is not specific for one problem, but does not require admission.  You are at low risk for an acute heart condition or other serious illness.   Your vital signs today were: BP (!) 141/98   Pulse 70   Temp 99 F (37.2 C) (Oral)   Resp 17   SpO2 100%  If your blood pressure (BP) was elevated above 135/85 this visit, please have this repeated by your doctor within one month. --------------

## 2022-07-01 NOTE — ED Triage Notes (Signed)
Pt with several day hx of headache as well as CP and then developed a rash on her shoulders and back which is spreading.  Also reports vaginal discharge.

## 2022-07-01 NOTE — ED Notes (Signed)
Pt OOB to go to bathroom/ Ambulating with even and steady gait. Pt endorses mild chest pressure.

## 2022-07-01 NOTE — ED Provider Notes (Signed)
[facility] Provider Note   CSN: [id number] Arrival date & time: 07/01/22  1621     History  Chief Complaint  Patient presents with   Chest Pain   Headache    Jennifer Mcgrath is a 47 y.o. female.  Patient with history of pulmonary embolism, completed course of anticoagulation about a year ago, history of hypertension --presents to the emergency department for several complaints.  She has had 1 to 2 weeks of chest pain without shortness of breath.  Pain is sharp, intermittent, mid chest.  No associated shortness of breath, diaphoresis, vomiting.  Over the past several days she is also developed a slight itchy rash on her upper chest and back with associated tingling in her left arm and left leg and a new vaginal discharge.  She denies urinary symptoms.  No new sexual partners.  She denies swelling in her legs or leg pain.  Also generalized headache, but no strokelike symptoms.  No fever or cough.  She could not get an appointment with her primary care doctor, prompting emergency department visit to make sure nothing more serious was going on.       Home Medications Prior to Admission medications   Medication Sig Start Date End Date Taking? Authorizing Provider  hydrochlorothiazide (MICROZIDE) 12.5 MG capsule Take 12.5 mg by mouth daily.    [provider]  NIFEdipine (PROCARDIA-XL/NIFEDICAL-XL) 30 MG 24 hr tablet TAKE 1 TABLET(30 MG) BY MOUTH DAILY 08/17/19   [provider]  potassium chloride SA (KLOR-CON) 20 MEQ tablet Take 2 tablets (40 mEq total) by mouth daily. 09/09/20   Davonna Belling, MD  XARELTO 10 MG TABS tablet TAKE 1 TABLET(10 MG) BY MOUTH DAILY WITH SUPPER 02/15/21   Volanda Napoleon, MD      Allergies    Patient has no known allergies.    Review of Systems   Review of Systems  Physical Exam Updated Vital Signs BP (!) 155/103 (BP Location: Right Arm)   Pulse 72   Temp 99 F (37.2 C) (Oral)   Resp 18    SpO2 100%   Physical Exam Vitals and nursing note reviewed.  Constitutional:      Appearance: She is well-developed. She is not diaphoretic.  HENT:     Head: Normocephalic and atraumatic.     Mouth/Throat:     Mouth: Mucous membranes are not dry.  Eyes:     Conjunctiva/sclera: Conjunctivae normal.  Neck:     Vascular: Normal carotid pulses. No JVD.     Trachea: Trachea normal. No tracheal deviation.  Cardiovascular:     Rate and Rhythm: Normal rate and regular rhythm.     Pulses: No decreased pulses.          Radial pulses are 2+ on the right side and 2+ on the left side.     Heart sounds: Normal heart sounds, S1 normal and S2 normal. No murmur heard. Pulmonary:     Effort: Pulmonary effort is normal. No respiratory distress.     Breath sounds: No wheezing.  Chest:     Chest wall: No tenderness.  Abdominal:     General: Bowel sounds are normal.     Palpations: Abdomen is soft.     Tenderness: There is abdominal tenderness. There is no guarding or rebound.     Comments: Mild generalized abdominal tenderness to palpation  Musculoskeletal:        General: Normal range of motion.  Cervical back: Normal range of motion and neck supple. No muscular tenderness.  Skin:    General: Skin is warm and dry.     Coloration: Skin is not pale.  Neurological:     Mental Status: She is alert.     ED Results / Procedures / Treatments   Labs (all labs ordered are listed, but only abnormal results are displayed) Labs Reviewed  CBC - Abnormal; Notable for the following components:      Result Value   Hemoglobin 9.7 (*)    HCT 30.9 (*)    MCV 78.4 (*)    MCH 24.6 (*)    RDW 17.0 (*)    All other components within normal limits  RESP PANEL BY RT-PCR (RSV, FLU A&B, COVID)  RVPGX2  WET PREP, GENITAL  BASIC METABOLIC PANEL  PREGNANCY, URINE  D-DIMER, QUANTITATIVE  HIV ANTIBODY (ROUTINE TESTING W REFLEX)  GC/CHLAMYDIA PROBE AMP ([facility]) NOT AT [facility]  TROPONIN I (HIGH  SENSITIVITY)    ED ECG REPORT   Date: 07/01/2022  Rate: 63  Rhythm: normal sinus rhythm  QRS Axis: normal  Intervals: normal  ST/T Wave abnormalities: normal  Conduction Disutrbances:none  Narrative Interpretation: [id number]  Old EKG Reviewed: changes noted  I have personally reviewed the EKG tracing and agree with the computerized printout as noted.    Radiology DG Chest 2 View  Result Date: 07/01/2022 CLINICAL DATA:  Chest pain EXAM: CHEST - 2 VIEW COMPARISON:  05/19/2022 and older FINDINGS: The heart size and mediastinal contours are within normal limits. Both lungs are clear. The visualized skeletal structures are unremarkable. Air-fluid level along the stomach beneath the left hemidiaphragm IMPRESSION: No acute cardiopulmonary disease Electronically Signed   By: Karen Kays M.D.   On: 07/01/2022 17:05    Procedures Procedures    Medications Ordered in ED Medications - No data to display  ED Course/ Medical Decision Making/ A&P    Patient seen and examined. History obtained directly from patient. Work-up including labs, imaging, EKG ordered in triage, if performed, were reviewed.    Labs/EKG: Independently reviewed and interpreted.  This included: EKG as above, CBC with hemoglobin 9.7 which is above baseline, normal white blood cell count; BMP unremarkable.  Troponin, UA pending.  Added D-dimer, wet prep, GC/chlamydia testing for discharge as well as respiratory viral panel.  Imaging: Independently visualized and interpreted.  This included: Chest x-ray agree negative  Medications/Fluids: None ordered  Most recent vital signs reviewed and are as follows: BP (!) 155/103 (BP Location: Right Arm)   Pulse 72   Temp 99 F (37.2 C) (Oral)   Resp 18   SpO2 100%   Initial impression: Multiple complaints.  Given her history of PE, will evaluate for recurrence with EKG, troponin, D-dimer.  Vital signs are reassuring without tachycardia or hypoxia at this time and patient is  not significantly short of breath.  Gross neuroexam is normal and I do not suspect emergent cause of headache.  Patient will self swab for vaginal infection and we will check a UA.  8:21 PM Reassessment performed. Patient appears stable, well-appearing.  Labs personally reviewed and interpreted including: Viral panel negative, D-dimer normal at 0.40, wet prep negative.  Reviewed pertinent lab work and imaging with patient at bedside. Questions answered.   Most current vital signs reviewed and are as follows: BP (!) 141/98   Pulse 70   Temp 99 F (37.2 C) (Oral)   Resp 17   SpO2 100%  Plan: Discharge to home.   Prescriptions written for: None  Other home care instructions discussed: Encouraged OTC meds, rest, hydration  Return and follow-up instructions: I encouraged patient to return to ED with severe chest pain, especially if the pain is crushing or pressure-like and spreads to the arms, back, neck, or jaw, or if they have associated sweating, vomiting, or shortness of breath with the pain, or significant pain with activity. We discussed that the evaluation here today indicates a low-risk of serious cause of chest pain, including heart trouble or a blood clot, but no evaluation is perfect and chest pain can evolve with time. The patient verbalized understanding and agreed.  I encouraged patient to follow-up with their provider in the next 48 hours for recheck.                               Medical Decision Making Amount and/or Complexity of Data Reviewed Labs: ordered. Radiology: ordered.   For this patient's complaint of chest pain, the following emergent conditions were considered on the differential diagnosis: acute coronary syndrome, pulmonary embolism, pneumothorax, myocarditis, pericardial tamponade, aortic dissection, thoracic aortic aneurysm complication, esophageal perforation.   Other causes were also considered including: gastroesophageal reflux disease,  musculoskeletal pain including costochondritis, pneumonia/pleurisy, herpes zoster, pericarditis.  In regards to possibility of ACS, patient has atypical features of pain, non-ischemic and unchanged EKG and negative troponin(s). Heart score was calculated to be 2.   In regards to possibility of PE, symptoms are atypical for PE and risk profile is low, making PE low likelihood.  D-dimer today was normal.  In regards to the patient's headache, critical differentials were considered including subarachnoid hemorrhage, intracerebral hemorrhage, epidural/subdural hematoma, pituitary apoplexy, vertebral/carotid artery dissection, giant cell arteritis, central venous thrombosis, reversible cerebral vasoconstriction, acute angle closure glaucoma, idiopathic intracranial hypertension, bacterial meningitis, viral encephalitis, carbon monoxide poisoning, posterior reversible encephalopathy syndrome, pre-eclampsia.   Reg flag symptoms related to these causes were considered including systemic symptoms (fever, weight loss), neurologic symptoms (confusion, mental status change, vision change, associated seizure), acute or sudden "thunderclap" onset, patient age 53 or older with new or progressive headache, patient of any age with first headache or change in headache pattern, pregnant or postpartum status, history of HIV or other immunocompromise, history of cancer, headache occurring with exertion, associated neck or shoulder pain, associated traumatic injury, concurrent use of anticoagulation, family history of spontaneous SAH, and concurrent drug use.    Other benign, more common causes of headache were considered including migraine, tension-type headache, cluster headache, referred pain from other cause such as sinus infection, dental pain, trigeminal neuralgia.   On exam, patient has a reassuring neuro exam including baseline mental status, no significant neck pain or meningeal signs, no signs of severe infection or  fever.   Vaginal discharge without signs of BV, trichomonas, candidiasis.  Testing for gonorrhea and chlamydia pending.  No lower abdominal pain to suggest PID.  The patient's vital signs, pertinent lab work and imaging were reviewed and interpreted as discussed in the ED course. Hospitalization was considered for further testing, treatments, or serial exams/observation. However as patient is well-appearing, has a stable exam over the course of their evaluation, and reassuring studies today, I do not feel that they warrant admission at this time. This plan was discussed with the patient who verbalizes agreement and comfort with this plan and seems reliable and able to return to the Emergency Department with worsening or  changing symptoms.          Final Clinical Impression(s) / ED Diagnoses Final diagnoses:  Chest pain, unspecified type  Acute nonintractable headache, unspecified headache type  Vaginal discharge    Rx / DC Orders ED Discharge Orders     None         Carlisle Cater, Hershal Coria 07/01/22 2022    Margette Fast, MD 07/01/22 2333

## 2022-07-02 LAB — HIV ANTIBODY (ROUTINE TESTING W REFLEX): HIV Screen 4th Generation wRfx: NONREACTIVE

## 2022-07-02 LAB — GC/CHLAMYDIA PROBE AMP (~~LOC~~) NOT AT ARMC
Chlamydia: NEGATIVE
Comment: NEGATIVE
Comment: NORMAL
Neisseria Gonorrhea: NEGATIVE

## 2022-08-15 ENCOUNTER — Encounter: Payer: Self-pay | Admitting: Family

## 2022-08-15 ENCOUNTER — Ambulatory Visit (INDEPENDENT_AMBULATORY_CARE_PROVIDER_SITE_OTHER): Payer: Medicaid Other | Admitting: Family

## 2022-08-15 VITALS — BP 134/84 | HR 63 | Temp 98.1°F | Resp 16 | Ht 62.0 in | Wt 178.0 lb

## 2022-08-15 DIAGNOSIS — I10 Essential (primary) hypertension: Secondary | ICD-10-CM | POA: Diagnosis not present

## 2022-08-15 DIAGNOSIS — D649 Anemia, unspecified: Secondary | ICD-10-CM | POA: Diagnosis not present

## 2022-08-15 DIAGNOSIS — Z86711 Personal history of pulmonary embolism: Secondary | ICD-10-CM

## 2022-08-15 DIAGNOSIS — R809 Proteinuria, unspecified: Secondary | ICD-10-CM

## 2022-08-15 DIAGNOSIS — R3129 Other microscopic hematuria: Secondary | ICD-10-CM

## 2022-08-15 MED ORDER — POTASSIUM CHLORIDE CRYS ER 20 MEQ PO TBCR: 20.0000 meq | EXTENDED_RELEASE_TABLET | Freq: Two times a day (BID) | ORAL | Status: DC

## 2022-08-15 MED ORDER — NIFEDIPINE ER OSMOTIC RELEASE 60 MG PO TB24: 60.0000 mg | ORAL_TABLET | Freq: Every day | ORAL | Status: DC

## 2022-08-15 NOTE — Assessment & Plan Note (Signed)
Review of outside records show that her H/H are trending upward on iron. Continue same. Plan iron studies next visit. Would also consider IFOB at that time.

## 2022-08-15 NOTE — Progress Notes (Signed)
Subjective:   By signing my name below, I, Jennifer Mcgrath, attest that this documentation has been prepared under the direction and in the presence of Jennifer Alar, NP. 08/15/2022.   Patient ID: Jennifer Mcgrath, female    DOB: 1976/03/21, 47 y.o.   MRN: [id number]  No chief complaint on file.   HPI Patient is in today for establishment of patient care.  Hypertension:  She reports that she has previously been to the ER due to hypertension. Today she brought her home BP cuff for validation, which she purchased in January. On recheck, her home cuff shows a reading of 157/105. BP Readings from Last 3 Encounters:  08/15/22 134/84  07/01/22 (!) 148/98  05/19/22 (!) 184/123   LE edema:  She complains of swelling in her ankles that she attributes to high sodium intake. Currently her regimen includes spironolactone and a potassium supplement (one tablet). She is also on an iron supplement. She no longer takes HCTZ.  Palpitations:  When she was 64-26 yo she was told she had heart palpitations. Occasionally she still experiences palpitations, sometimes with associated central chest pressure. She had presented to the ED 07/2022 for the chest pressure and had a cardiac workup including stress test and event monitor. She reports that the results were negative.    Pulmonary embolism:  Last occurred a few years ago. She states that it was thought to be related to her pregnancy. Anticoagulation was stopped after a time.  Hematuria:  Since she was 46 years old, she remembers that she has had known hematuria and proteinuria. Will perform urinalysis today.  Family history:  Her father had a history of hematuria, and type 2 diabetes. Her older sister had nephrolithiasis with associated hematuria. Her mother is borderline diabetic.   Social history:  Five children (3 sons, 2 daughters), most recently status post cesarean section with tubal ligation 05/2019. One of her sons has osteogenesis. She has 3  grandchildren. No tobacco or vaping use.  She works as an Optometrist for a Teacher, early years/pre. She enjoys reading during her free time.  Pap Smear:  Last completed 11/04/2018.  Mammogram:  Last completed 09/22/2017 ([facility]).  Immunizations:  Tdap last received 04/18/2019.  Influenza vaccine last received 01/30/2019.  Denies having any fever, new muscle pain, joint pain, new moles, congestion, sinus pain, sore throat, cough, SOB, wheezing, n/v/d, constipation, blood in stool, dysuria, frequency, at this time.  Past Medical History:  Diagnosis Date   GERD (gastroesophageal reflux disease)    Hypertension    Microscopic hematuria    PE (pulmonary thromboembolism) [facility])     Past Surgical History:  Procedure Laterality Date   CESAREAN SECTION  1992, 1999, 2003, 2008, 2020   TUBAL LIGATION  2020    Family History  Problem Relation Age of Onset   Diabetes Mellitus II Mother        borderline   Hematuria Father    Diabetes Mellitus II Father    Osteogenesis imperfecta Son     Social History   Socioeconomic History   Marital status: Single    Spouse name: Not on file   Number of children: Not on file   Years of education: Not on file   Highest education level: Not on file  Occupational History   Not on file  Tobacco Use   Smoking status: Never   Smokeless tobacco: Never  Vaping Use   Vaping Use: Never used  Substance and Sexual Activity   Alcohol use: Yes  Comment: occasional    Drug use: No   Sexual activity: Yes    Birth control/protection: Surgical  Other Topics Concern   Not on file  Social History Narrative   Works in Press photographer for a Agricultural consultant   5 children (3 boys and 2 girls)   3 Grandchildren   Enjoys reading the bible   No pets   Social Determinants of Radio broadcast assistant Strain: Not on file  Food Insecurity: Not on file  Transportation Needs: Not on file  Physical Activity: Not on file  Stress: Not on file  Social Connections: Not  on file  Intimate Partner Violence: Not on file    Outpatient Medications Prior to Visit  Medication Sig Dispense Refill   Ascorbic Acid (VITAMIN C) 1000 MG tablet Take 1,000 mg by mouth daily.     Ferrous Sulfate 140 (45 Fe) MG TBCR Take 1 tablet by mouth daily.     losartan (COZAAR) 50 MG tablet Take 50 mg by mouth daily.     magnesium oxide (MAG-OX) 400 (240 Mg) MG tablet Take 1 tablet by mouth 2 (two) times daily.     Omega-3 Fatty Acids (FISH OIL) 1000 MG CAPS Take by mouth.     spironolactone (ALDACTONE) 25 MG tablet Take 25 mg by mouth daily.     zinc gluconate 50 MG tablet Take 50 mg by mouth daily.     hydrochlorothiazide (MICROZIDE) 12.5 MG capsule Take 12.5 mg by mouth daily.     NIFEdipine (PROCARDIA-XL/NIFEDICAL-XL) 30 MG 24 hr tablet TAKE 1 TABLET(30 MG) BY MOUTH DAILY     potassium chloride SA (KLOR-CON) 20 MEQ tablet Take 2 tablets (40 mEq total) by mouth daily. 10 tablet 0   No facility-administered medications prior to visit.    No Known Allergies  Review of Systems  Constitutional:  Negative for fever.  HENT:  Negative for congestion, sinus pain and sore throat.   Respiratory:  Negative for cough, shortness of breath and wheezing.   Cardiovascular:  Positive for palpitations (Occasional) and leg swelling (Ankles).  Gastrointestinal:  Negative for blood in stool, constipation, diarrhea, nausea and vomiting.  Genitourinary:  Positive for hematuria. Negative for dysuria and frequency.  Musculoskeletal:  Negative for joint pain and myalgias.       Objective:    Physical Exam Constitutional:      Appearance: Normal appearance.  HENT:     Head: Normocephalic and atraumatic.     Right Ear: Tympanic membrane, ear canal and external ear normal.     Left Ear: Tympanic membrane, ear canal and external ear normal.  Eyes:     Extraocular Movements: Extraocular movements intact.     Pupils: Pupils are equal, round, and reactive to light.  Cardiovascular:     Rate  and Rhythm: Normal rate and regular rhythm.     Heart sounds: Normal heart sounds. No murmur heard.    No gallop.  Pulmonary:     Effort: Pulmonary effort is normal. No respiratory distress.     Breath sounds: Normal breath sounds. No wheezing or rales.  Musculoskeletal:     Right lower leg: 1+ Edema present.     Left lower leg: 1+ Edema present.  Skin:    General: Skin is warm and dry.  Neurological:     General: No focal deficit present.     Mental Status: She is alert and oriented to person, place, and time.  Psychiatric:  Mood and Affect: Mood normal.        Behavior: Behavior normal.     BP 134/84 (BP Location: Right Arm, Patient Position: Sitting, Cuff Size: Large)   Pulse 63   Temp 98.1 F (36.7 C) (Oral)   Resp 16   Ht 5\' 2"  (1.575 m)   Wt 178 lb (80.7 kg)   SpO2 100%   BMI 32.56 kg/m  Wt Readings from Last 3 Encounters:  08/15/22 178 lb (80.7 kg)  05/19/22 175 lb (79.4 kg)  12/12/21 178 lb 9.2 oz (81 kg)      Assessment & Plan:   Problem List Items Addressed This Visit       Unprioritized   Proteinuria    Refer to Nephrology.       Relevant Orders   Ambulatory referral to Nephrology   Primary hypertension    BP Readings from Last 3 Encounters:  08/15/22 134/84  07/01/22 (!) 148/98  05/19/22 (!) 184/123  BP looks good today on Aldactone, Nifedipine and losartan. States nephrology placed her on Aldactone in addition to her potassium so I assume she has also had issues with hypokalemia.        Relevant Medications   losartan (COZAAR) 50 MG tablet   spironolactone (ALDACTONE) 25 MG tablet   NIFEdipine (PROCARDIA XL/NIFEDICAL XL) 60 MG 24 hr tablet   Microscopic hematuria - Primary    Lifelong per patient. She was never able to complete her work up with nephrology. Will refer her back to nephrology. Renal imaging at [facility] was WNL.      Relevant Orders   Ambulatory referral to Nephrology   Urinalysis, Routine w reflex microscopic   History  of pulmonary embolus (PE)    2021- completed xarelto x 1 year.  Occurred shortly after pregnancy/delivery.  Had negative hypercoaguable panel.       Anemia    Review of outside records show that her H/H are trending upward on iron. Continue same. Plan iron studies next visit. Would also consider IFOB at that time.       Relevant Medications   Ferrous Sulfate 140 (45 Fe) MG TBCR   Labs reviewed in Care everywhere  Meds ordered this encounter  Medications   potassium chloride SA (KLOR-CON M) 20 MEQ tablet    Sig: Take 1 tablet (20 mEq total) by mouth 2 (two) times daily.    Order Specific Question:   Supervising Provider    Answer:   Penni Homans A [[id number]]   NIFEdipine (PROCARDIA XL/NIFEDICAL XL) 60 MG 24 hr tablet    Sig: Take 1 tablet (60 mg total) by mouth daily.    Order Specific Question:   Supervising Provider    Answer:   Penni Homans A [[id number]]   51 minutes spent on today's visit. Time was spent interviewing pt., reviewing outside records.    I, Nance Pear, NP, personally preformed the services described in this documentation.  All medical record entries made by the scribe were at my direction and in my presence.  I have reviewed the chart and discharge instructions (if applicable) and agree that the record reflects my personal performance and is accurate and complete. 08/15/2022.  I,Mathew Stumpf,acting as a Education administrator for [employer], NP.,have documented all relevant documentation on the behalf of Nance Pear, NP,as directed by  Nance Pear, NP while in the presence of Nance Pear, NP.   Nance Pear, NP

## 2022-08-15 NOTE — Assessment & Plan Note (Signed)
BP Readings from Last 3 Encounters:  08/15/22 134/84  07/01/22 (!) 148/98  05/19/22 (!) 184/123   BP looks good today on Aldactone, Nifedipine and losartan. [facility] nephrology placed her on Aldactone in addition to her potassium so I assume she has also had issues with hypokalemia.

## 2022-08-15 NOTE — Assessment & Plan Note (Signed)
Refer to Nephrology

## 2022-08-15 NOTE — Assessment & Plan Note (Addendum)
Lifelong per patient. She was never able to complete her work up with nephrology. Will refer her back to nephrology. Renal imaging at [facility] was WNL.

## 2022-08-15 NOTE — Assessment & Plan Note (Signed)
2021- completed xarelto x 1 year.  Occurred shortly after pregnancy/delivery.  Had negative hypercoaguable panel.

## 2022-08-16 LAB — URINALYSIS, ROUTINE W REFLEX MICROSCOPIC
Bilirubin Urine: NEGATIVE
Glucose, UA: NEGATIVE
Hyaline Cast: NONE SEEN /LPF
Ketones, ur: NEGATIVE
Nitrite: NEGATIVE
Specific Gravity, Urine: 1.021 (ref 1.001–1.035)
pH: 6.5 (ref 5.0–8.0)

## 2022-08-16 LAB — MICROSCOPIC MESSAGE

## 2022-08-28 ENCOUNTER — Encounter: Payer: Self-pay | Admitting: Family

## 2022-08-28 DIAGNOSIS — N029 Recurrent and persistent hematuria with unspecified morphologic changes: Secondary | ICD-10-CM | POA: Insufficient documentation

## 2022-08-28 DIAGNOSIS — D509 Iron deficiency anemia, unspecified: Secondary | ICD-10-CM | POA: Insufficient documentation

## 2022-11-21 ENCOUNTER — Ambulatory Visit (INDEPENDENT_AMBULATORY_CARE_PROVIDER_SITE_OTHER): Payer: Medicaid Other | Admitting: Family

## 2022-11-21 ENCOUNTER — Other Ambulatory Visit (HOSPITAL_COMMUNITY)
Admission: RE | Admit: 2022-11-21 | Discharge: 2022-11-21 | Disposition: A | Discharge status: Home / Self Care | Payer: Medicaid Other | Source: Ambulatory Visit | Attending: Family | Admitting: Family

## 2022-11-21 ENCOUNTER — Encounter: Payer: Self-pay | Admitting: Family

## 2022-11-21 VITALS — BP 137/89 | HR 65 | Temp 98.3°F | Resp 16 | Ht 62.0 in | Wt 178.0 lb

## 2022-11-21 DIAGNOSIS — D509 Iron deficiency anemia, unspecified: Secondary | ICD-10-CM

## 2022-11-21 DIAGNOSIS — I10 Essential (primary) hypertension: Secondary | ICD-10-CM

## 2022-11-21 DIAGNOSIS — H8113 Benign paroxysmal vertigo, bilateral: Secondary | ICD-10-CM | POA: Insufficient documentation

## 2022-11-21 DIAGNOSIS — Z1159 Encounter for screening for other viral diseases: Secondary | ICD-10-CM

## 2022-11-21 DIAGNOSIS — N898 Other specified noninflammatory disorders of vagina: Secondary | ICD-10-CM | POA: Insufficient documentation

## 2022-11-21 DIAGNOSIS — Z1231 Encounter for screening mammogram for malignant neoplasm of breast: Secondary | ICD-10-CM

## 2022-11-21 DIAGNOSIS — R5383 Other fatigue: Secondary | ICD-10-CM | POA: Diagnosis not present

## 2022-11-21 DIAGNOSIS — Z0001 Encounter for general adult medical examination with abnormal findings: Secondary | ICD-10-CM | POA: Insufficient documentation

## 2022-11-21 DIAGNOSIS — Z01419 Encounter for gynecological examination (general) (routine) without abnormal findings: Secondary | ICD-10-CM

## 2022-11-21 DIAGNOSIS — Z1211 Encounter for screening for malignant neoplasm of colon: Secondary | ICD-10-CM

## 2022-11-21 DIAGNOSIS — L304 Erythema intertrigo: Secondary | ICD-10-CM | POA: Insufficient documentation

## 2022-11-21 DIAGNOSIS — Z0000 Encounter for general adult medical examination without abnormal findings: Secondary | ICD-10-CM

## 2022-11-21 DIAGNOSIS — K5900 Constipation, unspecified: Secondary | ICD-10-CM | POA: Insufficient documentation

## 2022-11-21 MED ORDER — NYSTATIN 100000 UNIT/GM EX POWD
1.0000 | Freq: Two times a day (BID) | CUTANEOUS | 1 refills | Status: AC
Start: 2022-11-21 — End: ?

## 2022-11-21 MED ORDER — MECLIZINE HCL 25 MG PO TABS
25.0000 mg | ORAL_TABLET | Freq: Three times a day (TID) | ORAL | 0 refills | Status: DC | PRN
Start: 2022-11-21 — End: 2023-12-15

## 2022-11-21 NOTE — Patient Instructions (Signed)
VISIT SUMMARY:  During your recent visit, we discussed your ongoing issues with high blood pressure, vertigo, constipation, and a persistent vaginal discharge. We also talked about a skin tag under your right arm. Your blood pressure is stable with your current medications. We will continue to monitor your kidney function and potassium levels due to your hypertension medication. For your vertigo, I have prescribed meclizine and may refer you to physical therapy if symptoms persist. To help with your constipation, I recommend increasing your water intake. If there's no improvement, you can add an over-the-counter fiber supplement like Metamucil. We will also check your blood count and iron levels due to your history of anemia. For your vaginal discharge, we will perform some tests to rule out any infections.  YOUR PLAN:  -HYPERTENSION: Hypertension is high blood pressure. Continue with your current medications and we will monitor your kidney function and potassium levels.  -VERTIGO: Vertigo is a sensation of feeling off balance. I have prescribed meclizine to help with your symptoms. If symptoms persist, we may consider physical therapy.  -CONSTIPATION: Constipation is having hard stools or difficulty with bowel movements. Increase your water intake and consider adding a fiber supplement if there's no improvement.  -ANEMIA: Anemia is a condition where your body lacks enough healthy red blood cells. We will check your blood count and iron levels.  -VAGINAL DISCHARGE: Vaginal discharge can be a sign of an infection. We will perform some tests to rule out any infections.  INSTRUCTIONS:  In terms of general health maintenance, we performed a Pap smear and will also screen for Hepatitis C. I have referred you for a mammogram and a colonoscopy, as these have not been done recently. We will also check your Vitamin B12, Vitamin D, and thyroid function levels. Please follow up in six months.

## 2022-11-21 NOTE — Assessment & Plan Note (Signed)
BP stable, continue current medications.  

## 2022-11-21 NOTE — Assessment & Plan Note (Signed)
Uncontrolled. Refer to PT for vestibular rehab and give trial of meclizine.

## 2022-11-21 NOTE — Assessment & Plan Note (Signed)
Requests std testing, will also test for bv and yeast.

## 2022-11-21 NOTE — Assessment & Plan Note (Signed)
New. Rx provided for nystatin powder.  ?

## 2022-11-21 NOTE — Assessment & Plan Note (Signed)
New. Labs as ordered.  

## 2022-11-21 NOTE — Assessment & Plan Note (Signed)
She has been trying to take her iron supplement. Check iron levels and cbc.

## 2022-11-21 NOTE — Progress Notes (Signed)
Subjective:     Patient ID: Jennifer Mcgrath, female    DOB: 08-19-1975, 47 y.o.   MRN: [id number]  Chief Complaint  Patient presents with   Annual Exam    HPI  Discussed the use of AI scribe software for clinical note transcription with the patient, who gave verbal consent to proceed.  History of Present Illness   The patient, with a history of high blood pressure and anemia, presents for a physical examination. She expresses concern about dark yellow urine, suspecting inadequate water intake as the cause. she states that she drinks 36 oz of water a day. The patient also reports experiencing vaginal discharge without associated itching or burning. She consents to STD screening and testing for yeast and bacterial vaginosis.  The patient also mentions fatigue, which she attributes to her busy lifestyle and potential vitamin deficiencies. She expresses interest in having her A1c and B12 levels checked.  The patient reports experiencing vertigo, particularly on the left side, which has been ongoing for a while. She has tried exercises recommended by a previous doctor, but the symptoms persist.  The patient also reports constipation, with hard stools and discomfort during bowel movements. She has previously taken Linzess for this issue but this caused diarrhea.  The patient also mentions a mole under her right arm that has been growing, causing some concern.  Lastly, the patient reports occasional swelling in her ankles, which she attributes to her blood pressure medication.       Wt Readings from Last 3 Encounters:  11/21/22 178 lb (80.7 kg)  08/15/22 178 lb (80.7 kg)  05/19/22 175 lb (79.4 kg)      Health Maintenance Due  Topic Date Due   COVID-19 Vaccine (1) Never done   Hepatitis C Screening  Never done   DTaP/Tdap/Td (1 - Tdap) Never done   PAP SMEAR-Modifier  Never done   Colonoscopy  Never done    Past Medical History:  Diagnosis Date   Benign hematuria    Depression     GERD (gastroesophageal reflux disease)    Hypertension    Iron deficiency anemia    Microscopic hematuria    PE (pulmonary thromboembolism) ([facility])    Umbilical hernia     Past Surgical History:  Procedure Laterality Date   CESAREAN SECTION  1992, 1999, 2003, 2008, 2020   TUBAL LIGATION  2020    Family History  Problem Relation Age of Onset   Diabetes Mellitus II Mother        borderline   Hematuria Father    Diabetes Mellitus II Father    Osteogenesis imperfecta Son     Social History   Socioeconomic History   Marital status: Single    Spouse name: Not on file   Number of children: Not on file   Years of education: Not on file   Highest education level: Not on file  Occupational History   Not on file  Tobacco Use   Smoking status: Never   Smokeless tobacco: Never  Vaping Use   Vaping Use: Never used  Substance and Sexual Activity   Alcohol use: Yes    Comment: occasional    Drug use: No   Sexual activity: Yes    Birth control/protection: Surgical  Other Topics Concern   Not on file  Social History Narrative   Works in Audiological scientist for a Programme researcher, broadcasting/film/video   5 children (3 boys and 2 girls)   3 Grandchildren   Enjoys reading the bible  No pets   Social Determinants of Corporate investment banker Strain: Not on file  Food Insecurity: Not on file  Transportation Needs: Not on file  Physical Activity: Not on file  Stress: Not on file  Social Connections: Not on file  Intimate Partner Violence: Not on file    Outpatient Medications Prior to Visit  Medication Sig Dispense Refill   Ascorbic Acid (VITAMIN C) 1000 MG tablet Take 1,000 mg by mouth daily.     Ferrous Sulfate 140 (45 Fe) MG TBCR Take 1 tablet by mouth daily.     losartan (COZAAR) 50 MG tablet Take 50 mg by mouth daily.     magnesium oxide (MAG-OX) 400 (240 Mg) MG tablet Take 1 tablet by mouth 2 (two) times daily.     NIFEdipine (PROCARDIA XL/NIFEDICAL XL) 60 MG 24 hr tablet Take 1 tablet (60 mg  total) by mouth daily.     Omega-3 Fatty Acids (FISH OIL) 1000 MG CAPS Take by mouth.     potassium chloride SA (KLOR-CON M) 20 MEQ tablet Take 1 tablet (20 mEq total) by mouth 2 (two) times daily.     spironolactone (ALDACTONE) 25 MG tablet Take 25 mg by mouth daily.     zinc gluconate 50 MG tablet Take 50 mg by mouth daily.     No facility-administered medications prior to visit.    No Known Allergies  Review of Systems  Constitutional:        + fatigue  HENT:  Negative for congestion and hearing loss.   Eyes:  Negative for blurred vision.  Respiratory:  Negative for cough.   Cardiovascular:  Positive for leg swelling (some bilateral ankle swelling- chronic).  Gastrointestinal:  Positive for constipation.  Genitourinary:  Negative for dysuria and frequency.  Musculoskeletal:  Positive for neck pain (right neck).  Skin:  Negative for rash.  Neurological:  Positive for dizziness (occasional).  Psychiatric/Behavioral:  Negative for depression.        Objective:    Physical Exam Constitutional:      Appearance: Normal appearance.  HENT:     Head: Normocephalic and atraumatic.     Right Ear: Tympanic membrane and ear canal normal.     Left Ear: Tympanic membrane and ear canal normal.  Cardiovascular:     Rate and Rhythm: Normal rate and regular rhythm.  Pulmonary:     Effort: Pulmonary effort is normal.     Breath sounds: Normal breath sounds.  Abdominal:     General: Abdomen is flat.     Palpations: Abdomen is soft.  Neurological:     General: No focal deficit present.     Mental Status: She is alert and oriented to person, place, and time.  Psychiatric:        Mood and Affect: Mood normal.    HEENT: Eyes follow finger without difficulty. Oral cavity without lesions. Skin tag under right arm. Rash under arms likely fungal. BREAST: No masses palpated. Skin tag under right arm assessed as benign. GENITOURINARY: External genitalia exam normal. Cervix visualized without  lesions, discharge noted. Uterus and adnexa WNL NEUROLOGICAL: Cranial nerves II through XII grossly intact. Strength 5/5 in upper and lower extremities bilaterally. Reflexes normal. Positive vertigo symptoms on Dix-Hallpike maneuver, more pronounced on left side. SKIN: Rash under both breasts    BP 137/89 (BP Location: Right Arm, Patient Position: Sitting, Cuff Size: Large)   Pulse 65   Temp 98.3 F (36.8 C) (Oral)   Resp 16  Ht 5\' 2"  (1.575 m)   Wt 178 lb (80.7 kg)   SpO2 98%   BMI 32.56 kg/m  Wt Readings from Last 3 Encounters:  11/21/22 178 lb (80.7 kg)  08/15/22 178 lb (80.7 kg)  05/19/22 175 lb (79.4 kg)     Assessment & Plan:   Problem List Items Addressed This Visit       Unprioritized   Vaginal discharge    Requests std testing, will also test for bv and yeast.       Relevant Orders   Cervicovaginal ancillary only( [facility])   Primary hypertension    BP stable, continue current medications.      Relevant Orders   Comp Met (CMET)   Iron deficiency anemia    She has been trying to take her iron supplement. Check iron levels and cbc.       Relevant Orders   CBC w/Diff   B12   Iron, TIBC and Ferritin Panel   Intertrigo    New. Rx provided for nystatin powder.       Relevant Medications   nystatin (MYCOSTATIN/NYSTOP) powder   Fatigue    New. Labs as ordered.        Relevant Orders   Vitamin D (25 hydroxy)   TSH   Encounter for general adult medical examination with abnormal findings - Primary    Pap performed today.  Refer for colonoscopy.        Constipation    Uncontrolled. Recommended increase water intake to 64 oz a day and increase fiber- may need to add metamucil as well otc.       Benign paroxysmal positional vertigo due to bilateral vestibular disorder    Uncontrolled. Refer to PT for vestibular rehab and give trial of meclizine.       Relevant Medications   meclizine (ANTIVERT) 25 MG tablet   Other Relevant Orders   Ambulatory  referral to Physical Therapy   Other Visit Diagnoses     Encounter for routine gynecological examination with Papanicolaou smear of cervix       Relevant Orders   Cytology - PAP( [facility])   Vitamin D (25 hydroxy)   Encounter for hepatitis C screening test for low risk patient       Relevant Orders   Hepatitis C Antibody   Breast cancer screening by mammogram       Relevant Orders   MM 3D SCREENING MAMMOGRAM BILATERAL BREAST   Screening for colon cancer       Relevant Orders   Ambulatory referral to Gastroenterology       I am having Jennifer Mcgrath start on meclizine and nystatin. I am also having her maintain her Ferrous Sulfate, magnesium oxide, zinc gluconate, Fish Oil, vitamin C, losartan, spironolactone, potassium chloride SA, and NIFEdipine.  Meds ordered this encounter  Medications   meclizine (ANTIVERT) 25 MG tablet    Sig: Take 1 tablet (25 mg total) by mouth 3 (three) times daily as needed for dizziness.    Dispense:  30 tablet    Refill:  0    Order Specific Question:   Supervising Provider    Answer:   Danise Edge A [[id number]]   nystatin (MYCOSTATIN/NYSTOP) powder    Sig: Apply 1 Application topically 2 (two) times daily.    Dispense:  30 g    Refill:  1    Order Specific Question:   Supervising Provider    Answer:   Danise Edge A [[id number]]

## 2022-11-21 NOTE — Assessment & Plan Note (Signed)
Uncontrolled. Recommended increase water intake to 64 oz a day and increase fiber- may need to add metamucil as well otc.

## 2022-11-21 NOTE — Assessment & Plan Note (Signed)
Pap performed today.  Refer for colonoscopy.

## 2022-11-22 LAB — CBC WITH DIFFERENTIAL/PLATELET
Absolute Monocytes: 392 cells/uL (ref 200–950)
Basophils Absolute: 39 cells/uL (ref 0–200)
Basophils Relative: 0.8 %
Eosinophils Absolute: 103 cells/uL (ref 15–500)
Eosinophils Relative: 2.1 %
HCT: 33.8 % — ABNORMAL LOW (ref 35.0–45.0)
Hemoglobin: 11.1 g/dL — ABNORMAL LOW (ref 11.7–15.5)
Lymphs Abs: 2666 cells/uL (ref 850–3900)
MCH: 28.9 pg (ref 27.0–33.0)
MCHC: 32.8 g/dL (ref 32.0–36.0)
MCV: 88 fL (ref 80.0–100.0)
MPV: 10.3 fL (ref 7.5–12.5)
Monocytes Relative: 8 %
Neutro Abs: 1700 cells/uL (ref 1500–7800)
Neutrophils Relative %: 34.7 %
Platelets: 322 10*3/uL (ref 140–400)
RBC: 3.84 10*6/uL (ref 3.80–5.10)
RDW: 12.2 % (ref 11.0–15.0)
Total Lymphocyte: 54.4 %
WBC: 4.9 10*3/uL (ref 3.8–10.8)

## 2022-11-22 LAB — TSH: TSH: 2.47 mIU/L

## 2022-11-22 LAB — COMPREHENSIVE METABOLIC PANEL
AG Ratio: 1.3 (calc) (ref 1.0–2.5)
ALT: 15 U/L (ref 6–29)
AST: 16 U/L (ref 10–35)
Albumin: 3.8 g/dL (ref 3.6–5.1)
Alkaline phosphatase (APISO): 42 U/L (ref 31–125)
BUN: 10 mg/dL (ref 7–25)
CO2: 28 mmol/L (ref 20–32)
Calcium: 8.5 mg/dL — ABNORMAL LOW (ref 8.6–10.2)
Chloride: 106 mmol/L (ref 98–110)
Creat: 0.79 mg/dL (ref 0.50–0.99)
Globulin: 2.9 g/dL (calc) (ref 1.9–3.7)
Glucose, Bld: 77 mg/dL (ref 65–99)
Potassium: 3.8 mmol/L (ref 3.5–5.3)
Sodium: 137 mmol/L (ref 135–146)
Total Bilirubin: 0.4 mg/dL (ref 0.2–1.2)
Total Protein: 6.7 g/dL (ref 6.1–8.1)

## 2022-11-22 LAB — IRON,TIBC AND FERRITIN PANEL
%SAT: 22 % (calc) (ref 16–45)
Ferritin: 13 ng/mL — ABNORMAL LOW (ref 16–232)
Iron: 66 ug/dL (ref 40–190)
TIBC: 304 mcg/dL (calc) (ref 250–450)

## 2022-11-22 LAB — VITAMIN B12: Vitamin B-12: 651 pg/mL (ref 200–1100)

## 2022-11-22 LAB — HEPATITIS C ANTIBODY: Hepatitis C Ab: NONREACTIVE

## 2022-11-22 LAB — VITAMIN D 25 HYDROXY (VIT D DEFICIENCY, FRACTURES): Vit D, 25-Hydroxy: 30 ng/mL (ref 30–100)

## 2022-11-24 ENCOUNTER — Telehealth: Payer: Self-pay | Admitting: Family

## 2022-11-24 ENCOUNTER — Ambulatory Visit (HOSPITAL_BASED_OUTPATIENT_CLINIC_OR_DEPARTMENT_OTHER)
Admission: RE | Admit: 2022-11-24 | Discharge: 2022-11-24 | Disposition: A | Discharge status: Home / Self Care | Payer: Medicaid Other | Source: Ambulatory Visit | Attending: Family | Admitting: Family

## 2022-11-24 ENCOUNTER — Encounter (HOSPITAL_BASED_OUTPATIENT_CLINIC_OR_DEPARTMENT_OTHER): Payer: Self-pay

## 2022-11-24 DIAGNOSIS — Z1231 Encounter for screening mammogram for malignant neoplasm of breast: Secondary | ICD-10-CM | POA: Diagnosis present

## 2022-11-24 DIAGNOSIS — A599 Trichomoniasis, unspecified: Secondary | ICD-10-CM

## 2022-11-24 LAB — CYTOLOGY - PAP
Adequacy: ABSENT
Comment: NEGATIVE
Diagnosis: NEGATIVE
High risk HPV: NEGATIVE

## 2022-11-24 MED ORDER — METRONIDAZOLE 500 MG PO TABS: 500.0000 mg | ORAL_TABLET | Freq: Two times a day (BID) | ORAL | 0 refills | Status: AC

## 2022-11-24 NOTE — Telephone Encounter (Signed)
Please advise pt that blood work looks good. Remind pt on the importance of condom use. Iron leve is a little low. Please contilnue iron supplement.   Pap smear is normal but incidental finding of trichomonas is noted. I would like her to start metronidazole 500mg  bid x 7 days. Avoid alcohol when taking this medication. She should notify partner (s) and they should be treated as well.  Avoid sexual contact before completion of treatment.

## 2022-11-25 NOTE — Telephone Encounter (Signed)
Called patient but no answer, left voice mail for patient to call back.   

## 2022-11-25 NOTE — Telephone Encounter (Signed)
Spoke with patient and informed of results and recommendation for use of condoms and overall labs and pap was normal findings low iron and  continue supplements and trich. Metronidazole 500 mg bid for 7 days, avoid alcohol, inform partner for treatment. Patient verbalized understanding and has pick up medication from pharmacy.

## 2022-11-26 ENCOUNTER — Encounter: Payer: Self-pay | Admitting: Family

## 2022-11-26 LAB — CERVICOVAGINAL ANCILLARY ONLY
Bacterial Vaginitis (gardnerella): POSITIVE — AB
Candida Glabrata: NEGATIVE
Candida Vaginitis: NEGATIVE
Chlamydia: NEGATIVE
Comment: NEGATIVE
Comment: NEGATIVE
Comment: NEGATIVE
Comment: NEGATIVE
Comment: NEGATIVE
Comment: NORMAL
Neisseria Gonorrhea: NEGATIVE
Trichomonas: POSITIVE — AB

## 2022-12-11 ENCOUNTER — Ambulatory Visit: Payer: Medicaid Other | Attending: Family | Admitting: Physical Therapy

## 2022-12-11 DIAGNOSIS — H832X3 Labyrinthine dysfunction, bilateral: Secondary | ICD-10-CM | POA: Insufficient documentation

## 2022-12-11 DIAGNOSIS — R42 Dizziness and giddiness: Secondary | ICD-10-CM | POA: Insufficient documentation

## 2022-12-11 DIAGNOSIS — G8929 Other chronic pain: Secondary | ICD-10-CM | POA: Insufficient documentation

## 2022-12-11 DIAGNOSIS — M542 Cervicalgia: Secondary | ICD-10-CM | POA: Insufficient documentation

## 2022-12-16 ENCOUNTER — Other Ambulatory Visit: Payer: Self-pay

## 2022-12-16 ENCOUNTER — Ambulatory Visit: Payer: Medicaid Other

## 2022-12-16 DIAGNOSIS — R42 Dizziness and giddiness: Secondary | ICD-10-CM | POA: Diagnosis present

## 2022-12-16 DIAGNOSIS — H832X3 Labyrinthine dysfunction, bilateral: Secondary | ICD-10-CM | POA: Diagnosis present

## 2022-12-16 DIAGNOSIS — G8929 Other chronic pain: Secondary | ICD-10-CM

## 2022-12-16 DIAGNOSIS — M542 Cervicalgia: Secondary | ICD-10-CM | POA: Diagnosis present

## 2022-12-16 NOTE — Therapy (Signed)
OUTPATIENT PHYSICAL THERAPY VESTIBULAR EVALUATION   Patient Name: Jennifer Mcgrath MRN: [id number] DOB:[birth year], 47 y.o., female Today's Date: 12/16/2022  END OF SESSION:  PT End of Session - 12/16/22 1355     Visit Number 1    Date for PT Re-Evaluation 02/10/23    PT Start Time 1358    PT Stop Time 1445    PT Time Calculation (min) 47 min             Past Medical History:  Diagnosis Date   Benign hematuria    Depression    GERD (gastroesophageal reflux disease)    Hypertension    Iron deficiency anemia    Microscopic hematuria    PE (pulmonary thromboembolism) (HCC)    Umbilical hernia    Past Surgical History:  Procedure Laterality Date   CESAREAN SECTION  1992, 1999, 2003, 2008, 2020   TUBAL LIGATION  2020   Patient Active Problem List   Diagnosis Date Noted   Intertrigo 11/21/2022   Fatigue 11/21/2022   Benign paroxysmal positional vertigo due to bilateral vestibular disorder 11/21/2022   Vaginal discharge 11/21/2022   Constipation 11/21/2022   Encounter for general adult medical examination with abnormal findings 11/21/2022   Iron deficiency anemia 08/28/2022   Benign hematuria 08/28/2022   Anemia 08/15/2022   Microscopic hematuria 08/15/2022   Proteinuria 08/15/2022   Primary hypertension 08/15/2022   History of pulmonary embolus (PE) 08/15/2022   Hypertension 08/15/2022    PCP: Val EagleDaryel Gerald, NP REFERRING PROVIDER: same  REFERRING DIAG: B BPPV  THERAPY DIAG:  Dizziness and giddiness  Neck pain, chronic  Vestibular disequilibrium involving both inner ears  ONSET DATE: one year, July 2023  Rationale for Evaluation and Treatment: Rehabilitation  SUBJECTIVE:   SUBJECTIVE STATEMENT: Just started to feel dizzy, about a year ago, went to ER but never had any PT assessment.  Take meclizine and it helps also ibuprofen Pt accompanied by: self  PERTINENT HISTORY: vertigo   PAIN:  Are you having pain? Yes: NPRS scale: 2/10 Pain location:  l upper cervical spine Pain description: tender , headaches on L side head and upper neck Aggravating factors: na Relieving factors: na  PRECAUTIONS: None  RED FLAGS: None   WEIGHT BEARING RESTRICTIONS: No  FALLS: Has patient fallen in last 6 months? No  LIVING ENVIRONMENT: Lives with: lives with their family and lives with their partner Lives in: House/apartment Stairs: Yes: Internal: 11 steps; on right going up Has following equipment at home: Grab bars  PLOF: Independent  PATIENT GOALS: get rid of constant dizziness  OBJECTIVE:   DIAGNOSTIC FINDINGS: CT head one year ago normal  COGNITION: Overall cognitive status: Within functional limits for tasks assessed   SENSATION: WFL  MUSCLE TONE: wnl     POSTURE:  forward head  Cervical ROM:    Active A/PROM (deg) eval  Flexion 50  Extension 75  Right lateral flexion wfl  Left lateral flexion wfl  Right rotation 60  Left rotation 75  (Blank rows = not tested)  STRENGTH: MMT gross Ue's and LE's wfl   BED MOBILITY: all wnl and I    TRANSFERS: wnl sit to stand, stand to sit   GAIT: wnl I   FUNCTIONAL TESTS:  Dynamic Gait Index: TBD  PATIENT SURVEYS:  DHI 24/100  VESTIBULAR ASSESSMENT:  GENERAL OBSERVATION: tends to guard and avoid fast head movements, avoids looking down, L eye partially closed at times and some quivering, tension B eyelids twitching at times  SYMPTOM BEHAVIOR:  Subjective history: one year hx no know onset  Non-Vestibular symptoms: changes in hearing, neck pain, and headaches  Type of dizziness: Spinning/Vertigo  Frequency: daily  Duration: constant   Aggravating factors: Induced by motion: bending down to the ground  Relieving factors: medication  Progression of symptoms: unchanged  OCULOMOTOR EXAM:  Ocular Alignment: normal  Ocular ROM: No Limitations  Spontaneous Nystagmus: absent  Gaze-Induced Nystagmus: absent  Smooth Pursuits:  diminished to R   Saccades:  intact  Convergence/Divergence: 6 cm   VESTIBULAR - OCULAR REFLEX:   Slow VOR: Normal  VOR Cancellation: Normal  Head-Impulse Test: HIT Right: positive  Dynamic Visual Acuity: Not able to be assessed   POSITIONAL TESTING: Right Roll Test: apogeotropic nystagmus Left Roll Test: no nystagmus  MOTION SENSITIVITY:  Motion Sensitivity Quotient Intensity: 0 = none, 1 = Lightheaded, 2 = Mild, 3 = Moderate, 4 = Severe, 5 = Vomiting  Intensity  1. Sitting to supine   2. Supine to L side   3. Supine to R side   4. Supine to sitting   5. L Hallpike-Dix   6. Up from L    7. R Hallpike-Dix   8. Up from R    9. Sitting, head tipped to L knee   10. Head up from L knee   11. Sitting, head tipped to R knee   12. Head up from R knee   13. Sitting head turns x5   14.Sitting head nods x5   15. In stance, 180 turn to L    16. In stance, 180 turn to R     OTHOSTATICS: not done  FUNCTIONAL GAIT: Dynamic Gait Index: TBD   VESTIBULAR TREATMENT:                                                                                                   DATE: 12/16/22: Eval, able to partially assess horizontal canals but patient could not tolerate the cervical ROM and did not maintain eyes open for assessment.  Did not brief horizontal ageotrophic nystagumus with R horizontal canal testing  Manual:  cervical spine eval, assessed distraction, palpation of post upper cervical spine /suboccipital musculature as well as lateral glides.  Patient pt tender L suboccipitals and decreased R upper cervical ROM Relief of Sx and reduced dizziness noted with manual cervical distraction.   Instructed in isometrics for cervical spine extension, and SB per HEP below.  PATIENT EDUCATION: Education details: 12/16/22 Person educated: Patient Education method: Explanation, Demonstration, Tactile cues, Verbal cues, and Handouts Education comprehension: verbalized understanding, returned demonstration, and verbal cues  required  HOME EXERCISE PROGRAM: Access Code: [email or web address] URL: [email or web address] Date: 12/16/2022 Prepared by: Caralee Ates  Exercises - Standing Isometric Cervical Sidebending with Manual Resistance  - 1 x daily - 7 x weekly - 3 sets - 10 reps - Standing Isometric Cervical Extension with Manual Resistance  - 1 x daily - 7 x weekly - 3 sets - 10 reps - Supine Head Nod with Deep Neck Flexor Activation  - 1 x daily - 7 x weekly - 3  sets - 10 reps GOALS: Goals reviewed with patient? Yes  SHORT TERM GOALS: Target date: 2 weeks,   I HEP Baseline:initiated today Goal status: INITIAL      LONG TERM GOALS: Target date: 02/10/23  Improve cervical spine ROM for FB and for rotation to wnl and pain free to improve tolerance to bending down Baseline: Rotation 60 to 70 % and FB 50% Goal status: INITIAL  2.  Improve DHI from 24% to 12% or less Baseline:  Goal status: INITIAL  3.  Improve cervical spine motion enough to complete horizontal canal and ant/ psot canal BPPV testing and Rx Baseline: unable due to c spine stiffness/ pain Goal status: INITIAL  4. Patient will score 25 or higher on DGI Baseline: TBD Goal status: initial   ASSESSMENT:  CLINICAL IMPRESSION: Patient is a 47 y.o. female who was seen today for physical therapy evaluation and treatment for B BPPV.  At today's evaluation she did demonstrate some dizziness triggered by specific head movements, but her neck pain and stiffness limited her ability to participate in BPPV testing.  She did demonstrate less guarding and reported less subjective dizziness Sx with some cervical spine manual techniques and deep isometric ex. Will benefit from skilled PT to address her deficits and improve her quality of life.   OBJECTIVE IMPAIRMENTS: decreased activity tolerance, decreased ROM, dizziness, hypomobility, impaired flexibility, and pain.   ACTIVITY LIMITATIONS: bending, squatting, and sleeping  PARTICIPATION  LIMITATIONS: laundry, driving, and yard work  PERSONAL FACTORS: Behavior pattern, Fitness, Past/current experiences, Time since onset of injury/illness/exacerbation, and 1-2 comorbidities: fatigue, anemia  are also affecting patient's functional outcome.   REHAB POTENTIAL: Good  CLINICAL DECISION MAKING: Evolving/moderate complexity  EVALUATION COMPLEXITY: Moderate   PLAN:  PT FREQUENCY: 1-2x/week  PT DURATION: 8 weeks  PLANNED INTERVENTIONS: Therapeutic exercises, Therapeutic activity, Neuromuscular re-education, Balance training, Gait training, Patient/Family education, Self Care, and Joint mobilization  PLAN FOR NEXT SESSION: reassess cervical spine ROM, assess for BPPV if able to participate, utilize manual techniques for cervical spine motion, progress therex   Jesalyn Finazzo L Cruz Bong, PT 12/16/2022, 5:53 PM

## 2022-12-24 ENCOUNTER — Encounter: Payer: Self-pay | Admitting: Physical Therapy

## 2022-12-24 ENCOUNTER — Ambulatory Visit: Payer: Medicaid Other | Admitting: Physical Therapy

## 2022-12-24 DIAGNOSIS — R42 Dizziness and giddiness: Secondary | ICD-10-CM | POA: Diagnosis not present

## 2022-12-24 DIAGNOSIS — G8929 Other chronic pain: Secondary | ICD-10-CM

## 2022-12-24 DIAGNOSIS — H832X3 Labyrinthine dysfunction, bilateral: Secondary | ICD-10-CM

## 2022-12-24 NOTE — Therapy (Signed)
OUTPATIENT PHYSICAL THERAPY VESTIBULAR EVALUATION   Patient Name: Jennifer Mcgrath MRN: [id number] DOB:[birth year], 47 y.o., female Today's Date: 12/24/2022  END OF SESSION:  PT End of Session - 12/24/22 1409     Visit Number 2    Date for PT Re-Evaluation 02/10/23    PT Start Time 1409    PT Stop Time 1445    PT Time Calculation (min) 36 min             Past Medical History:  Diagnosis Date   Benign hematuria    Depression    GERD (gastroesophageal reflux disease)    Hypertension    Iron deficiency anemia    Microscopic hematuria    PE (pulmonary thromboembolism) (HCC)    Umbilical hernia    Past Surgical History:  Procedure Laterality Date   CESAREAN SECTION  1992, 1999, 2003, 2008, 2020   TUBAL LIGATION  2020   Patient Active Problem List   Diagnosis Date Noted   Intertrigo 11/21/2022   Fatigue 11/21/2022   Benign paroxysmal positional vertigo due to bilateral vestibular disorder 11/21/2022   Vaginal discharge 11/21/2022   Constipation 11/21/2022   Encounter for general adult medical examination with abnormal findings 11/21/2022   Iron deficiency anemia 08/28/2022   Benign hematuria 08/28/2022   Anemia 08/15/2022   Microscopic hematuria 08/15/2022   Proteinuria 08/15/2022   Primary hypertension 08/15/2022   History of pulmonary embolus (PE) 08/15/2022   Hypertension 08/15/2022    PCP: Val EagleDaryel Gerald, NP REFERRING PROVIDER: same  REFERRING DIAG: B BPPV  THERAPY DIAG:  Dizziness and giddiness  Neck pain, chronic  Vestibular disequilibrium involving both inner ears  ONSET DATE: one year, July 2023  Rationale for Evaluation and Treatment: Rehabilitation  SUBJECTIVE:   SUBJECTIVE STATEMENT: Has been doing her exercises which seem to help initially but then gets tight again, tries hard not to drop anything because gets dizzy when she bends over.   Having a lot of neck pain, took ibuprofen at 9AM to help.  Pt accompanied by: self  PERTINENT  HISTORY: vertigo   PAIN:  Are you having pain? Yes: NPRS scale: 3-4/10 Pain location: l upper cervical spine Pain description: tender , headaches on L side head and upper neck Aggravating factors: na Relieving factors: ibuprofen  PRECAUTIONS: None  RED FLAGS: None   WEIGHT BEARING RESTRICTIONS: No  FALLS: Has patient fallen in last 6 months? No  LIVING ENVIRONMENT: Lives with: lives with their family and lives with their partner Lives in: House/apartment Stairs: Yes: Internal: 11 steps; on right going up Has following equipment at home: Grab bars  PLOF: Independent  PATIENT GOALS: get rid of constant dizziness  OBJECTIVE:   DIAGNOSTIC FINDINGS: CT head one year ago normal  COGNITION: Overall cognitive status: Within functional limits for tasks assessed   SENSATION: WFL  MUSCLE TONE: wnl     POSTURE:  forward head  Cervical ROM:    Active A/PROM (deg) eval AROM 12/24/22  Flexion 50 30p!  Extension 75 50p!  Right lateral flexion wfl   Left lateral flexion wfl   Right rotation 60 55p!  Left rotation 75 40p!  (Blank rows = not tested)  STRENGTH: MMT gross Ue's and LE's wfl   BED MOBILITY: all wnl and I    TRANSFERS: wnl sit to stand, stand to sit   GAIT: wnl I   FUNCTIONAL TESTS:  Dynamic Gait Index: TBD  PATIENT SURVEYS:  DHI 24/100  VESTIBULAR ASSESSMENT:  GENERAL OBSERVATION: tends to  guard and avoid fast head movements, avoids looking down, L eye partially closed at times and some quivering, tension B eyelids twitching at times   SYMPTOM BEHAVIOR:  Subjective history: one year hx no know onset  Non-Vestibular symptoms: changes in hearing, neck pain, and headaches  Type of dizziness: Spinning/Vertigo  Frequency: daily  Duration: constant   Aggravating factors: Induced by motion: bending down to the ground  Relieving factors: medication  Progression of symptoms: unchanged  OCULOMOTOR EXAM:  Ocular Alignment: normal  Ocular ROM:  No Limitations  Spontaneous Nystagmus: absent  Gaze-Induced Nystagmus: absent  Smooth Pursuits:  diminished to R   Saccades: intact  Convergence/Divergence: 6 cm   VESTIBULAR - OCULAR REFLEX:   Slow VOR: Normal  VOR Cancellation: Normal  Head-Impulse Test: HIT Right: positive  Dynamic Visual Acuity: Not able to be assessed   POSITIONAL TESTING: Right Roll Test: apogeotropic nystagmus Left Roll Test: no nystagmus  MOTION SENSITIVITY:  Motion Sensitivity Quotient Intensity: 0 = none, 1 = Lightheaded, 2 = Mild, 3 = Moderate, 4 = Severe, 5 = Vomiting  Intensity  1. Sitting to supine   2. Supine to L side   3. Supine to R side   4. Supine to sitting   5. L Hallpike-Dix   6. Up from L    7. R Hallpike-Dix   8. Up from R    9. Sitting, head tipped to L knee   10. Head up from L knee   11. Sitting, head tipped to R knee   12. Head up from R knee   13. Sitting head turns x5   14.Sitting head nods x5   15. In stance, 180 turn to L    16. In stance, 180 turn to R     OTHOSTATICS: not done  FUNCTIONAL GAIT: Dynamic Gait Index: TBD   VESTIBULAR TREATMENT:                                                                                                   DATE:  12/24/22: Canalith Repositioning:   Epley Right: Number of Reps: 2 and Response to Treatment: symptoms improved Manual Therapy: to decrease muscle spasm and pain and improve mobility STM/TPR to cervical paraspinals, bil UT and levator scapulae   12/16/22: Eval, able to partially assess horizontal canals but patient could not tolerate the cervical ROM and did not maintain eyes open for assessment.  Did not brief horizontal ageotrophic nystagumus with R horizontal canal testing  Manual:  cervical spine eval, assessed distraction, palpation of post upper cervical spine /suboccipital musculature as well as lateral glides.  Patient pt tender L suboccipitals and decreased R upper cervical ROM Relief of Sx and reduced dizziness  noted with manual cervical distraction.   Instructed in isometrics for cervical spine extension, and SB per HEP below.  PATIENT EDUCATION: Education details: HEP update, hand out on TrDN Person educated: Patient Education method: Explanation, Demonstration, Tactile cues, Verbal cues, and Handouts Education comprehension: verbalized understanding and returned demonstration  HOME EXERCISE PROGRAM: Access Code: [email or web address] URL: [email or web address] Date: 12/24/2022 Prepared by: Harrie Foreman  Exercises - Standing Isometric Cervical Sidebending with Manual Resistance  - 1 x daily - 7 x weekly - 1 sets - 5 reps - 5 sec  hold - Standing Isometric Cervical Extension with Manual Resistance  - 1 x daily - 7 x weekly - 1 sets - 5 reps - 5 sec hold - Supine Head Nod with Deep Neck Flexor Activation  - 1 x daily - 7 x weekly - 1 sets - 5 reps - 5 sec hold - Gentle Levator Scapulae Stretch  - 1 x daily - 7 x weekly - 1 sets - 3 reps - 10-15 sec  hold  GOALS: Goals reviewed with patient? Yes  SHORT TERM GOALS: Target date: 2 weeks,   I HEP Baseline:initiated today Goal status: IN PROGRESS  LONG TERM GOALS: Target date: 02/10/23  Improve cervical spine ROM for FB and for rotation to wnl and pain free to improve tolerance to bending down Baseline: Rotation 60 to 70 % and FB 50% Goal status: IN PROGRESS  2.  Improve DHI from 24% to 12% or less Baseline:  Goal status: IN PROGRESS  3.  Improve cervical spine motion enough to complete horizontal canal and ant/ post canal BPPV testing and Rx Baseline: unable due to c spine stiffness/ pain Goal status: IN PROGRESS  12/24/22- completed testing for posterior canal   4. Patient will score 25 or higher on DGI Baseline: TBD Goal status: IN PROGRESS   ASSESSMENT:  CLINICAL IMPRESSION: Tarhonda Hollenberg reports continued dizziness and neck pain.  She tried to complete exercises but didn't received handout.  Today reassesses cervical  ROM, AROM was worse than on eval, but still adequate to complete BPPV testing.  On testing both posterior canals positive for BPPV with rotational nystagmus with [facility], but R worse than left, so Right treated today, after Epley noted significant improvement with only very mild dizziness and brief nystagmus, so repeated 1 additional time.  Will treat left next session.  Reviewed exercises briefly, adding stretch for levator scapulae since this is extremely tight, printing and texting HEP.  Manual therapy to cervical paraspinals, bil UT and LS.  Discussed TrDN also, since she has very large palpable trigger points in these muscles, she is interested.  Charmion Hapke continues to demonstrate potential for improvement and would benefit from continued skilled therapy to address impairments.     OBJECTIVE IMPAIRMENTS: decreased activity tolerance, decreased ROM, dizziness, hypomobility, impaired flexibility, and pain.   ACTIVITY LIMITATIONS: bending, squatting, and sleeping  PARTICIPATION LIMITATIONS: laundry, driving, and yard work  PERSONAL FACTORS: Behavior pattern, Fitness, Past/current experiences, Time since onset of injury/illness/exacerbation, and 1-2 comorbidities: fatigue, anemia  are also affecting patient's functional outcome.   REHAB POTENTIAL: Good  CLINICAL DECISION MAKING: Evolving/moderate complexity  EVALUATION COMPLEXITY: Moderate   PLAN:  PT FREQUENCY: 1-2x/week  PT DURATION: 8 weeks  PLANNED INTERVENTIONS: Therapeutic exercises, Therapeutic activity, Neuromuscular re-education, Balance training, Gait training, Patient/Family education, Self Care, and Joint mobilization  PLAN FOR NEXT SESSION: recheck canals, treat left side, manual therapy, TrDN to UT/LS if interested.  Review HEP.     Jena Gauss, PT 12/24/2022, 3:10 PM

## 2022-12-31 ENCOUNTER — Encounter: Payer: Self-pay | Admitting: Physical Therapy

## 2022-12-31 ENCOUNTER — Ambulatory Visit: Payer: Medicaid Other | Admitting: Physical Therapy

## 2022-12-31 DIAGNOSIS — R42 Dizziness and giddiness: Secondary | ICD-10-CM

## 2022-12-31 DIAGNOSIS — G8929 Other chronic pain: Secondary | ICD-10-CM

## 2022-12-31 DIAGNOSIS — H832X3 Labyrinthine dysfunction, bilateral: Secondary | ICD-10-CM

## 2022-12-31 NOTE — Therapy (Signed)
OUTPATIENT PHYSICAL THERAPY VESTIBULAR EVALUATION   Patient Name: Jennifer Mcgrath MRN: [id number] DOB:[birth year], 47 y.o., female Today's Date: 12/31/2022  END OF SESSION:  PT End of Session - 12/31/22 1624     Visit Number 3    Date for PT Re-Evaluation 02/10/23    PT Start Time 1621    PT Stop Time 1701    PT Time Calculation (min) 40 min             Past Medical History:  Diagnosis Date   Benign hematuria    Depression    GERD (gastroesophageal reflux disease)    Hypertension    Iron deficiency anemia    Microscopic hematuria    PE (pulmonary thromboembolism) (HCC)    Umbilical hernia    Past Surgical History:  Procedure Laterality Date   CESAREAN SECTION  1992, 1999, 2003, 2008, 2020   TUBAL LIGATION  2020   Patient Active Problem List   Diagnosis Date Noted   Intertrigo 11/21/2022   Fatigue 11/21/2022   Benign paroxysmal positional vertigo due to bilateral vestibular disorder 11/21/2022   Vaginal discharge 11/21/2022   Constipation 11/21/2022   Encounter for general adult medical examination with abnormal findings 11/21/2022   Iron deficiency anemia 08/28/2022   Benign hematuria 08/28/2022   Anemia 08/15/2022   Microscopic hematuria 08/15/2022   Proteinuria 08/15/2022   Primary hypertension 08/15/2022   History of pulmonary embolus (PE) 08/15/2022   Hypertension 08/15/2022    PCP: Val EagleDaryel Gerald, NP REFERRING PROVIDER: same  REFERRING DIAG: B BPPV  THERAPY DIAG:  Dizziness and giddiness  Neck pain, chronic  Vestibular disequilibrium involving both inner ears  ONSET DATE: one year, July 2023  Rationale for Evaluation and Treatment: Rehabilitation  SUBJECTIVE:   SUBJECTIVE STATEMENT: Feeling a lot better.  Very little dizziness this week, just a little bit yesterday.  Has been doing her exercises.  Pt accompanied by: self  PERTINENT HISTORY: vertigo   PAIN:  Are you having pain? Yes: NPRS scale: 3-4/10 Pain location: l upper  cervical spine Pain description: tender , headaches on L side head and upper neck Aggravating factors: na Relieving factors: ibuprofen  PRECAUTIONS: None  RED FLAGS: None   WEIGHT BEARING RESTRICTIONS: No  FALLS: Has patient fallen in last 6 months? No  LIVING ENVIRONMENT: Lives with: lives with their family and lives with their partner Lives in: House/apartment Stairs: Yes: Internal: 11 steps; on right going up Has following equipment at home: Grab bars  PLOF: Independent  PATIENT GOALS: get rid of constant dizziness  OBJECTIVE:   DIAGNOSTIC FINDINGS: CT head one year ago normal  COGNITION: Overall cognitive status: Within functional limits for tasks assessed   SENSATION: WFL  MUSCLE TONE: wnl     POSTURE:  forward head  Cervical ROM:    Active A/PROM (deg) eval AROM 12/24/22  Flexion 50 30p!  Extension 75 50p!  Right lateral flexion wfl   Left lateral flexion wfl   Right rotation 60 55p!  Left rotation 75 40p!  (Blank rows = not tested)  STRENGTH: MMT gross Ue's and LE's wfl   BED MOBILITY: all wnl and I    TRANSFERS: wnl sit to stand, stand to sit   GAIT: wnl I   FUNCTIONAL TESTS:  Dynamic Gait Index: TBD  PATIENT SURVEYS:  DHI 24/100  VESTIBULAR ASSESSMENT:  GENERAL OBSERVATION: tends to guard and avoid fast head movements, avoids looking down, L eye partially closed at times and some quivering, tension B eyelids  twitching at times   SYMPTOM BEHAVIOR:  Subjective history: one year hx no know onset  Non-Vestibular symptoms: changes in hearing, neck pain, and headaches  Type of dizziness: Spinning/Vertigo  Frequency: daily  Duration: constant   Aggravating factors: Induced by motion: bending down to the ground  Relieving factors: medication  Progression of symptoms: unchanged  OCULOMOTOR EXAM:  Ocular Alignment: normal  Ocular ROM: No Limitations  Spontaneous Nystagmus: absent  Gaze-Induced Nystagmus: absent  Smooth Pursuits:   diminished to R   Saccades: intact  Convergence/Divergence: 6 cm   VESTIBULAR - OCULAR REFLEX:   Slow VOR: Normal  VOR Cancellation: Normal  Head-Impulse Test: HIT Right: positive  Dynamic Visual Acuity: Not able to be assessed   POSITIONAL TESTING: Right Roll Test: apogeotropic nystagmus Left Roll Test: no nystagmus  MOTION SENSITIVITY:  Motion Sensitivity Quotient Intensity: 0 = none, 1 = Lightheaded, 2 = Mild, 3 = Moderate, 4 = Severe, 5 = Vomiting  Intensity  1. Sitting to supine   2. Supine to L side   3. Supine to R side   4. Supine to sitting   5. L Hallpike-Dix   6. Up from L    7. R Hallpike-Dix   8. Up from R    9. Sitting, head tipped to L knee   10. Head up from L knee   11. Sitting, head tipped to R knee   12. Head up from R knee   13. Sitting head turns x5   14.Sitting head nods x5   15. In stance, 180 turn to L    16. In stance, 180 turn to R     OTHOSTATICS: not done  FUNCTIONAL GAIT: Dynamic Gait Index: TBD   VESTIBULAR TREATMENT:                                                                                                   DATE: 12/31/22 Canalith Repositioning:   Epley Left: Number of Reps: 1 and Response to Treatment: symptoms resolved Manual Therapy: to decrease muscle spasm and pain and improve mobility STM/TPR to cervical paraspinals, bil UT and levator scapulae, skilled palpation and monitoring during dry needling. Trigger Point Dry-Needling  Treatment instructions: Expect mild to moderate muscle soreness. S/S of pneumothorax if dry needled over a lung field, and to seek immediate medical attention should they occur. Patient verbalized understanding of these instructions and education. Patient Consent Given: Yes Education handout provided: Yes Muscles treated: R UT  Electrical stimulation performed: No Parameters: N/A Treatment response/outcome: Twitch Response Elicited and Palpable Increase in Muscle Length    12/24/22: Canalith  Repositioning:   Epley Right: Number of Reps: 2 and Response to Treatment: symptoms improved Manual Therapy: to decrease muscle spasm and pain and improve mobility STM/TPR to cervical paraspinals, bil UT and levator scapulae   12/16/22: Eval, able to partially assess horizontal canals but patient could not tolerate the cervical ROM and did not maintain eyes open for assessment.  Did not brief horizontal ageotrophic nystagumus with R horizontal canal testing  Manual:  cervical spine eval, assessed distraction, palpation of  post upper cervical spine /suboccipital musculature as well as lateral glides.  Patient pt tender L suboccipitals and decreased R upper cervical ROM Relief of Sx and reduced dizziness noted with manual cervical distraction.   Instructed in isometrics for cervical spine extension, and SB per HEP below.  PATIENT EDUCATION: Education details: HEP update, hand out on TrDN Person educated: Patient Education method: Explanation, Demonstration, Tactile cues, Verbal cues, and Handouts Education comprehension: verbalized understanding and returned demonstration  HOME EXERCISE PROGRAM: Access Code: [email or web address] URL: [email or web address] Date: 12/24/2022 Prepared by: Harrie Foreman  Exercises - Standing Isometric Cervical Sidebending with Manual Resistance  - 1 x daily - 7 x weekly - 1 sets - 5 reps - 5 sec  hold - Standing Isometric Cervical Extension with Manual Resistance  - 1 x daily - 7 x weekly - 1 sets - 5 reps - 5 sec hold - Supine Head Nod with Deep Neck Flexor Activation  - 1 x daily - 7 x weekly - 1 sets - 5 reps - 5 sec hold - Gentle Levator Scapulae Stretch  - 1 x daily - 7 x weekly - 1 sets - 3 reps - 10-15 sec  hold  GOALS: Goals reviewed with patient? Yes  SHORT TERM GOALS: Target date: 2 weeks,   I HEP Baseline:initiated today Goal status: MET 12/31/22  LONG TERM GOALS: Target date: 02/10/23  Improve cervical spine ROM for FB and for  rotation to wnl and pain free to improve tolerance to bending down Baseline: Rotation 60 to 70 % and FB 50% Goal status: IN PROGRESS  2.  Improve DHI from 24% to 12% or less Baseline:  Goal status: IN PROGRESS  3.  Improve cervical spine motion enough to complete horizontal canal and ant/ post canal BPPV testing and Rx Baseline: unable due to c spine stiffness/ pain Goal status: IN PROGRESS  12/24/22- completed testing for posterior canal   4. Patient will score 25 or higher on DGI Baseline: TBD Goal status: IN PROGRESS   ASSESSMENT:  CLINICAL IMPRESSION: Tatina Lavinder  reports significant improvement in dizziness, only 1 incident of dizziness this week, and improved neck pain and mobility as well.  Rechecked R posterior canal, this canal was clear, so treated L canal today, which resolved.  Continued working on cervical ROM with manual therapy, After explanation of DN rational, procedures, outcomes and potential side effects, patient verbalized consent to DN treatment in conjunction with manual STM/DTM and TPR to reduce ttp/muscle tension.  She tolerated TrDN fairly so only inserted 1 needle in R UT then stopped today, but still had palpable decrease in spasm.   Sonakshi Chambers continues to demonstrate potential for improvement and would benefit from continued skilled therapy to address impairments.     OBJECTIVE IMPAIRMENTS: decreased activity tolerance, decreased ROM, dizziness, hypomobility, impaired flexibility, and pain.   ACTIVITY LIMITATIONS: bending, squatting, and sleeping  PARTICIPATION LIMITATIONS: laundry, driving, and yard work  PERSONAL FACTORS: Behavior pattern, Fitness, Past/current experiences, Time since onset of injury/illness/exacerbation, and 1-2 comorbidities: fatigue, anemia  are also affecting patient's functional outcome.   REHAB POTENTIAL: Good  CLINICAL DECISION MAKING: Evolving/moderate complexity  EVALUATION COMPLEXITY: Moderate   PLAN:  PT FREQUENCY:  1-2x/week  PT DURATION: 8 weeks  PLANNED INTERVENTIONS: Therapeutic exercises, Therapeutic activity, Neuromuscular re-education, Balance training, Gait training, Patient/Family education, Self Care, and Joint mobilization  PLAN FOR NEXT SESSION: recheck canals, continue manual therapy, review/update  HEP.     Jena Gauss, PT, DPT  12/31/2022, 5:59  PM

## 2023-01-05 ENCOUNTER — Encounter: Payer: Self-pay | Admitting: Physical Therapy

## 2023-01-05 ENCOUNTER — Ambulatory Visit: Payer: Medicaid Other | Attending: Family | Admitting: Physical Therapy

## 2023-01-05 DIAGNOSIS — R42 Dizziness and giddiness: Secondary | ICD-10-CM | POA: Diagnosis present

## 2023-01-05 DIAGNOSIS — H832X3 Labyrinthine dysfunction, bilateral: Secondary | ICD-10-CM | POA: Diagnosis present

## 2023-01-05 DIAGNOSIS — M542 Cervicalgia: Secondary | ICD-10-CM | POA: Diagnosis present

## 2023-01-05 DIAGNOSIS — G8929 Other chronic pain: Secondary | ICD-10-CM | POA: Diagnosis present

## 2023-01-05 NOTE — Therapy (Signed)
OUTPATIENT PHYSICAL THERAPY VESTIBULAR EVALUATION   Patient Name: Jennifer Mcgrath MRN: [id number] DOB:10-03-1975, 47 y.o., female Today's Date: 01/05/2023  END OF SESSION:  PT End of Session - 01/05/23 1623     Visit Number 4    Date for PT Re-Evaluation 02/10/23    PT Start Time 1535    PT Stop Time 1620    PT Time Calculation (min) 45 min             Past Medical History:  Diagnosis Date   Benign hematuria    Depression    GERD (gastroesophageal reflux disease)    Hypertension    Iron deficiency anemia    Microscopic hematuria    PE (pulmonary thromboembolism) (HCC)    Umbilical hernia    Past Surgical History:  Procedure Laterality Date   CESAREAN SECTION  1992, 1999, 2003, 2008, 2020   TUBAL LIGATION  2020   Patient Active Problem List   Diagnosis Date Noted   Intertrigo 11/21/2022   Fatigue 11/21/2022   Benign paroxysmal positional vertigo due to bilateral vestibular disorder 11/21/2022   Vaginal discharge 11/21/2022   Constipation 11/21/2022   Encounter for general adult medical examination with abnormal findings 11/21/2022   Iron deficiency anemia 08/28/2022   Benign hematuria 08/28/2022   Anemia 08/15/2022   Microscopic hematuria 08/15/2022   Proteinuria 08/15/2022   Primary hypertension 08/15/2022   History of pulmonary embolus (PE) 08/15/2022   Hypertension 08/15/2022    PCP: Val EagleDaryel Gerald, NP REFERRING PROVIDER: same  REFERRING DIAG: B BPPV  THERAPY DIAG:  Dizziness and giddiness  Neck pain, chronic  Vestibular disequilibrium involving both inner ears  ONSET DATE: one year, July 2023  Rationale for Evaluation and Treatment: Rehabilitation  SUBJECTIVE:   SUBJECTIVE STATEMENT: In general dizziness has been a lot better but she has a severe headache today, almost cancelled, migraine is making her dizzy.  Had to keep lights off today.  Pt accompanied by: self  PERTINENT HISTORY: vertigo   PAIN:  Are you having pain? Yes: NPRS  scale: 8/10 Pain location: headache Pain description: tender , headaches on L side head and upper neck  PRECAUTIONS: None  RED FLAGS: None   WEIGHT BEARING RESTRICTIONS: No  FALLS: Has patient fallen in last 6 months? No  LIVING ENVIRONMENT: Lives with: lives with their family and lives with their partner Lives in: House/apartment Stairs: Yes: Internal: 11 steps; on right going up Has following equipment at home: Grab bars  PLOF: Independent  PATIENT GOALS: get rid of constant dizziness  OBJECTIVE:   DIAGNOSTIC FINDINGS: CT head one year ago normal  COGNITION: Overall cognitive status: Within functional limits for tasks assessed   SENSATION: WFL  MUSCLE TONE: wnl     POSTURE:  forward head  Cervical ROM:    Active A/PROM (deg) eval AROM 12/24/22  Flexion 50 30p!  Extension 75 50p!  Right lateral flexion wfl   Left lateral flexion wfl   Right rotation 60 55p!  Left rotation 75 40p!  (Blank rows = not tested)  STRENGTH: MMT gross Ue's and LE's wfl   BED MOBILITY: all wnl and I    TRANSFERS: wnl sit to stand, stand to sit   GAIT: wnl I   FUNCTIONAL TESTS:  Dynamic Gait Index: TBD  PATIENT SURVEYS:  DHI 24/100  VESTIBULAR ASSESSMENT:  GENERAL OBSERVATION: tends to guard and avoid fast head movements, avoids looking down, L eye partially closed at times and some quivering, tension B eyelids twitching  at times   SYMPTOM BEHAVIOR:  Subjective history: one year hx no know onset  Non-Vestibular symptoms: changes in hearing, neck pain, and headaches  Type of dizziness: Spinning/Vertigo  Frequency: daily  Duration: constant   Aggravating factors: Induced by motion: bending down to the ground  Relieving factors: medication  Progression of symptoms: unchanged  OCULOMOTOR EXAM:  Ocular Alignment: normal  Ocular ROM: No Limitations  Spontaneous Nystagmus: absent  Gaze-Induced Nystagmus: absent  Smooth Pursuits:  diminished to R   Saccades:  intact  Convergence/Divergence: 6 cm   VESTIBULAR - OCULAR REFLEX:   Slow VOR: Normal  VOR Cancellation: Normal  Head-Impulse Test: HIT Right: positive  Dynamic Visual Acuity: Not able to be assessed   POSITIONAL TESTING: Right Roll Test: apogeotropic nystagmus Left Roll Test: no nystagmus  MOTION SENSITIVITY:  Motion Sensitivity Quotient Intensity: 0 = none, 1 = Lightheaded, 2 = Mild, 3 = Moderate, 4 = Severe, 5 = Vomiting  Intensity  1. Sitting to supine   2. Supine to L side   3. Supine to R side   4. Supine to sitting   5. L Hallpike-Dix   6. Up from L    7. R Hallpike-Dix   8. Up from R    9. Sitting, head tipped to L knee   10. Head up from L knee   11. Sitting, head tipped to R knee   12. Head up from R knee   13. Sitting head turns x5   14.Sitting head nods x5   15. In stance, 180 turn to L    16. In stance, 180 turn to R     OTHOSTATICS: not done  FUNCTIONAL GAIT: Dynamic Gait Index: TBD   VESTIBULAR TREATMENT:                                                                                                   DATE: 01/05/2023 Manual Therapy: to decrease muscle spasm and pain and improve mobility STM/TPR to cervical paraspinals, bil UT, masseter, temporals, frontalis.  PA mobs to cervical spine, NAGs into rotation.   Modalities: MHP to neck in supine hooklying with CP to forehead x 15 min for muscle relaxation to decrease HA.    12/31/22 Canalith Repositioning:   Epley Left: Number of Reps: 1 and Response to Treatment: symptoms resolved Manual Therapy: to decrease muscle spasm and pain and improve mobility STM/TPR to cervical paraspinals, bil UT and levator scapulae, skilled palpation and monitoring during dry needling. Trigger Point Dry-Needling  Treatment instructions: Expect mild to moderate muscle soreness. S/S of pneumothorax if dry needled over a lung field, and to seek immediate medical attention should they occur. Patient verbalized understanding of  these instructions and education. Patient Consent Given: Yes Education handout provided: Yes Muscles treated: R UT  Electrical stimulation performed: No Parameters: N/A Treatment response/outcome: Twitch Response Elicited and Palpable Increase in Muscle Length    12/24/22: Canalith Repositioning:   Epley Right: Number of Reps: 2 and Response to Treatment: symptoms improved Manual Therapy: to decrease muscle spasm and pain and improve mobility STM/TPR to cervical  paraspinals, bil UT and levator scapulae   12/16/22: Eval, able to partially assess horizontal canals but patient could not tolerate the cervical ROM and did not maintain eyes open for assessment.  Did not brief horizontal ageotrophic nystagumus with R horizontal canal testing  Manual:  cervical spine eval, assessed distraction, palpation of post upper cervical spine /suboccipital musculature as well as lateral glides.  Patient pt tender L suboccipitals and decreased R upper cervical ROM Relief of Sx and reduced dizziness noted with manual cervical distraction.   Instructed in isometrics for cervical spine extension, and SB per HEP below.  PATIENT EDUCATION: Education details: HEP update, hand out on TrDN Person educated: Patient Education method: Explanation, Demonstration, Tactile cues, Verbal cues, and Handouts Education comprehension: verbalized understanding and returned demonstration  HOME EXERCISE PROGRAM: Access Code: [email or web address] URL: [email or web address] Date: 12/24/2022 Prepared by: Harrie Foreman  Exercises - Standing Isometric Cervical Sidebending with Manual Resistance  - 1 x daily - 7 x weekly - 1 sets - 5 reps - 5 sec  hold - Standing Isometric Cervical Extension with Manual Resistance  - 1 x daily - 7 x weekly - 1 sets - 5 reps - 5 sec hold - Supine Head Nod with Deep Neck Flexor Activation  - 1 x daily - 7 x weekly - 1 sets - 5 reps - 5 sec hold - Gentle Levator Scapulae Stretch  - 1 x daily  - 7 x weekly - 1 sets - 3 reps - 10-15 sec  hold  GOALS: Goals reviewed with patient? Yes  SHORT TERM GOALS: Target date: 2 weeks,   I HEP Baseline:initiated today Goal status: MET 12/31/22  LONG TERM GOALS: Target date: 02/10/23  Improve cervical spine ROM for FB and for rotation to wnl and pain free to improve tolerance to bending down Baseline: Rotation 60 to 70 % and FB 50% Goal status: IN PROGRESS  2.  Improve DHI from 24% to 12% or less Baseline:  Goal status: IN PROGRESS  3.  Improve cervical spine motion enough to complete horizontal canal and ant/ post canal BPPV testing and Rx Baseline: unable due to c spine stiffness/ pain Goal status: IN PROGRESS  12/24/22- completed testing for posterior canal   4. Patient will score 25 or higher on DGI Baseline: TBD Goal status: IN PROGRESS   ASSESSMENT:  CLINICAL IMPRESSION: Brynnlee Withee  reported overall improvement in dizziness prior today when started having severe Headache/migraine resulting in increased dizziness.  Focused on manual therapy to neck followed by modalities, reported significant decrease following both in HA and dizziness.   Sameeha Wichmann continues to demonstrate potential for improvement and would benefit from continued skilled therapy to address impairments.     OBJECTIVE IMPAIRMENTS: decreased activity tolerance, decreased ROM, dizziness, hypomobility, impaired flexibility, and pain.   ACTIVITY LIMITATIONS: bending, squatting, and sleeping  PARTICIPATION LIMITATIONS: laundry, driving, and yard work  PERSONAL FACTORS: Behavior pattern, Fitness, Past/current experiences, Time since onset of injury/illness/exacerbation, and 1-2 comorbidities: fatigue, anemia  are also affecting patient's functional outcome.   REHAB POTENTIAL: Good  CLINICAL DECISION MAKING: Evolving/moderate complexity  EVALUATION COMPLEXITY: Moderate   PLAN:  PT FREQUENCY: 1-2x/week  PT DURATION: 8 weeks  PLANNED INTERVENTIONS:  Therapeutic exercises, Therapeutic activity, Neuromuscular re-education, Balance training, Gait training, Patient/Family education, Self Care, and Joint mobilization  PLAN FOR NEXT SESSION: recheck canals, continue manual therapy, review/update  HEP.     Jena Gauss, PT, DPT  01/05/2023, 5:22 PM

## 2023-01-08 ENCOUNTER — Ambulatory Visit: Payer: Medicaid Other | Admitting: Physical Therapy

## 2023-01-14 ENCOUNTER — Telehealth: Payer: Self-pay

## 2023-01-14 NOTE — Telephone Encounter (Signed)
Called patient for verification on refill request for ventolin due to patient received original prescription from another provider and currently new patient at our practice. Patient confirmed request and refill by Sandford Craze today and patient aware rx sent to [facility] at [address] in [place].

## 2023-01-19 ENCOUNTER — Ambulatory Visit: Payer: Medicaid Other | Admitting: Physical Therapy

## 2023-01-28 ENCOUNTER — Ambulatory Visit: Payer: Medicaid Other | Admitting: Physical Therapy

## 2023-01-30 ENCOUNTER — Ambulatory Visit: Payer: Medicaid Other | Admitting: Family

## 2023-02-06 ENCOUNTER — Ambulatory Visit: Payer: Medicaid Other | Admitting: Family

## 2023-02-09 ENCOUNTER — Ambulatory Visit: Payer: Medicaid Other | Admitting: Family

## 2023-02-10 ENCOUNTER — Ambulatory Visit (INDEPENDENT_AMBULATORY_CARE_PROVIDER_SITE_OTHER): Payer: Medicaid Other | Admitting: Family

## 2023-02-10 ENCOUNTER — Other Ambulatory Visit (HOSPITAL_COMMUNITY)
Admission: RE | Admit: 2023-02-10 | Discharge: 2023-02-10 | Disposition: A | Discharge status: Home / Self Care | Payer: Medicaid Other | Source: Ambulatory Visit | Attending: Family | Admitting: Family

## 2023-02-10 VITALS — BP 132/80 | HR 76 | Temp 98.6°F | Resp 16 | Wt 177.0 lb

## 2023-02-10 DIAGNOSIS — J45909 Unspecified asthma, uncomplicated: Secondary | ICD-10-CM

## 2023-02-10 DIAGNOSIS — I10 Essential (primary) hypertension: Secondary | ICD-10-CM

## 2023-02-10 DIAGNOSIS — N898 Other specified noninflammatory disorders of vagina: Secondary | ICD-10-CM | POA: Insufficient documentation

## 2023-02-10 DIAGNOSIS — D509 Iron deficiency anemia, unspecified: Secondary | ICD-10-CM | POA: Diagnosis not present

## 2023-02-10 MED ORDER — ALBUTEROL SULFATE HFA 108 (90 BASE) MCG/ACT IN AERS: 2.0000 | INHALATION_SPRAY | Freq: Four times a day (QID) | RESPIRATORY_TRACT | 5 refills | Status: AC | PRN

## 2023-02-10 MED ORDER — LOSARTAN POTASSIUM 50 MG PO TABS: 50.0000 mg | ORAL_TABLET | Freq: Every day | ORAL | Status: DC

## 2023-02-10 NOTE — Assessment & Plan Note (Signed)
  Blood pressure controlled at 132/80. Taking losartan 50mg , aldactone, and nifedipine 60mg  daily. -Continue current regimen.

## 2023-02-10 NOTE — Assessment & Plan Note (Signed)
  Infrequent use of Ventolin inhaler. Last used approximately 2 weeks ago. -Refill Ventolin inhaler prescription at [facility], [facility].

## 2023-02-10 NOTE — Patient Instructions (Addendum)
Please call [facility] to schedule your colonoscopy-  [phone].  VISIT SUMMARY:  During your visit, we discussed your persistent vaginal discharge, your asthma, your iron deficiency, and your hypertension. We also discussed your general health maintenance, including your upcoming colonoscopy and the need for an influenza vaccine.  YOUR PLAN:  -VAGINAL DISCHARGE: You have a clear discharge without odor or itching. We took a swab today to check for any possible infection.  -ASTHMA: You use your Ventolin inhaler infrequently, with the last use about 2 weeks ago. We have refilled your prescription at [facility].  -IRON DEFICIENCY: You are taking an iron supplement once daily for your low iron levels. Please continue with this regimen.  -HYPERTENSION: Your blood pressure is controlled at 132/80. You are taking losartan 50mg , aldactone, and nifedipine 60mg  daily. Please continue with this regimen.  INSTRUCTIONS:  Please follow up in 6 months. We will contact you with the results of your vaginal swab.

## 2023-02-10 NOTE — Progress Notes (Signed)
Subjective:     Patient ID: Jennifer Mcgrath, female    DOB: 1975-08-27, 47 y.o.   MRN: [id number]  Chief Complaint  Patient presents with   Vaginal Discharge    Patient complains of vaginal discharge.    Asthma    Needs a ventolin inhaler, used to get with previous provider    Vaginal Discharge The patient's primary symptoms include vaginal discharge.  Asthma Her past medical history is significant for asthma.    Discussed the use of AI scribe software for clinical note transcription with the patient, who gave verbal consent to proceed.  History of Present Illness   The patient, with a history of vaginal discharge, presents for follow-up. She reports a persistent clear discharge, which she initially thought was due to Trichomoniasis. She completed the prescribed treatment and noticed an improvement, but a small amount of discharge persisted. The discharge is not associated with odor or itching. She reports that her partner was treated for trichomonas.  In addition, the patient reports needing a Ventolin inhaler refill. She has not needed to use it in about four months, but experienced an episode of shortness of breath two weeks ago. She also reports undergoing rehabilitation for vertigo, which has been very helpful.  The patient is due for a colonoscopy and has been referred to [facility]. She is currently taking an iron supplement once a day for low iron levels. She also takes losartan, aldactone, and nifedipine for blood pressure control.     Declines flu shot.    Health Maintenance Due  Topic Date Due   COVID-19 Vaccine (1) Never done   Colonoscopy  Never done   INFLUENZA VACCINE  Never done    Past Medical History:  Diagnosis Date   Benign hematuria    Depression    GERD (gastroesophageal reflux disease)    Hypertension    Iron deficiency anemia    Microscopic hematuria    PE (pulmonary thromboembolism) (HCC)    Umbilical hernia     Past Surgical  History:  Procedure Laterality Date   CESAREAN SECTION  1992, 1999, 2003, 2008, 2020   TUBAL LIGATION  2020    Family History  Problem Relation Age of Onset   Diabetes Mellitus II Mother        borderline   Hematuria Father    Diabetes Mellitus II Father    Osteogenesis imperfecta Son     Social History   Socioeconomic History   Marital status: Single    Spouse name: Not on file   Number of children: Not on file   Years of education: Not on file   Highest education level: Not on file  Occupational History   Not on file  Tobacco Use   Smoking status: Never   Smokeless tobacco: Never  Vaping Use   Vaping status: Never Used  Substance and Sexual Activity   Alcohol use: Yes    Comment: occasional    Drug use: No   Sexual activity: Yes    Birth control/protection: Surgical  Other Topics Concern   Not on file  Social History Narrative   Works in Audiological scientist for a Programme researcher, broadcasting/film/video   5 children (3 boys and 2 girls)   3 Grandchildren   Enjoys reading the bible   No pets   Social Determinants of Health   Financial Resource Strain: Not on file  Food Insecurity: Not on file  Transportation Needs: Not on file  Physical Activity: Unknown (04/12/2019)   Received  from [facility] visits prior to 08/02/2022., [facility] visits prior to 08/02/2022.   Exercise Vital Sign    Days of Exercise per Week: Patient refused    Minutes of Exercise per Session: Patient refused  Stress: Unknown (04/12/2019)   [facility] visits prior to 08/02/2022., [facility] visits prior to 08/02/2022.   [employer] of Occupational Health - Occupational Stress Questionnaire    Feeling of Stress : Patient refused  Social Connections: Unknown (04/12/2019)   [facility] prior to 08/02/2022., [facility] visits prior to 08/02/2022.   Social  Connection and Isolation Panel [NHANES]    Frequency of Communication with Friends and Family: Patient refused    Frequency of Social Gatherings with Friends and Family: Patient refused    Attends Religious Services: Patient refused    Active Member of Clubs or Organizations: Patient refused    Attends Banker Meetings: Patient refused    Marital Status: Patient refused  Intimate Partner Violence: Unknown (04/12/2019)   [facility] prior to 08/02/2022., [facility] visits prior to 08/02/2022.   Humiliation, Afraid, Rape, and Kick questionnaire    Fear of Current or Ex-Partner: Patient refused    Emotionally Abused: Patient refused    Physically Abused: Patient refused    Sexually Abused: Patient refused    Outpatient Medications Prior to Visit  Medication Sig Dispense Refill   Ascorbic Acid (VITAMIN C) 1000 MG tablet Take 1,000 mg by mouth daily.     Ferrous Sulfate 140 (45 Fe) MG TBCR Take 1 tablet by mouth daily.     magnesium oxide (MAG-OX) 400 (240 Mg) MG tablet Take 1 tablet by mouth 2 (two) times daily.     meclizine (ANTIVERT) 25 MG tablet Take 1 tablet (25 mg total) by mouth 3 (three) times daily as needed for dizziness. 30 tablet 0   NIFEdipine (PROCARDIA XL/NIFEDICAL XL) 60 MG 24 hr tablet Take 1 tablet (60 mg total) by mouth daily.     nystatin (MYCOSTATIN/NYSTOP) powder Apply 1 Application topically 2 (two) times daily. 30 g 1   Omega-3 Fatty Acids (FISH OIL) 1000 MG CAPS Take by mouth.     potassium chloride SA (KLOR-CON M) 20 MEQ tablet Take 1 tablet (20 mEq total) by mouth 2 (two) times daily.     spironolactone (ALDACTONE) 25 MG tablet Take 25 mg by mouth daily.     zinc gluconate 50 MG tablet Take 50 mg by mouth daily.     losartan (COZAAR) 50 MG tablet Take 50 mg by mouth daily.     No facility-administered medications prior to visit.    No Known Allergies  Review of Systems  Genitourinary:   Positive for vaginal discharge.       Objective:    Physical Exam Exam conducted with a chaperone present.  Constitutional:      Appearance: Normal appearance.  HENT:     Head: Normocephalic and atraumatic.  Pulmonary:     Effort: Pulmonary effort is normal.  Genitourinary:    Exam position: Lithotomy position.     Pubic Area: No rash or pubic lice.   Skin:    General: Skin is warm and dry.  Neurological:     General: No focal deficit present.     Mental Status: She is alert and oriented to  person, place, and time.      BP 132/80 (BP Location: Right Arm, Patient Position: Sitting, Cuff Size: Large)   Pulse 76   Temp 98.6 F (37 C) (Oral)   Resp 16   Wt 177 lb (80.3 kg)   SpO2 100%   BMI 32.37 kg/m  Wt Readings from Last 3 Encounters:  02/10/23 177 lb (80.3 kg)  11/21/22 178 lb (80.7 kg)  08/15/22 178 lb (80.7 kg)       Assessment & Plan:   Problem List Items Addressed This Visit       Unprioritized   Vaginal discharge - Primary    Recurrent, will send swab for GC/Chlamydia/trich/BV. Treatment pending results review.      Relevant Orders   Cervicovaginal ancillary only( [place])   Primary hypertension     Blood pressure controlled at 132/80. Taking losartan 50mg , aldactone, and nifedipine 60mg  daily. -Continue current regimen.      Relevant Medications   losartan (COZAAR) 50 MG tablet   Iron deficiency anemia     Taking iron supplement once daily. -Continue current regimen.       Asthma     Infrequent use of Ventolin inhaler. Last used approximately 2 weeks ago. -Refill Ventolin inhaler prescription at [facility].      Relevant Medications   albuterol (VENTOLIN HFA) 108 (90 Base) MCG/ACT inhaler    I have changed Saquoia Stampley's losartan. I am also having her start on albuterol. Additionally, I am having her maintain her Ferrous Sulfate, magnesium oxide, zinc gluconate, Fish Oil, vitamin C, spironolactone, potassium chloride  SA, NIFEdipine, meclizine, and nystatin.  Meds ordered this encounter  Medications   albuterol (VENTOLIN HFA) 108 (90 Base) MCG/ACT inhaler    Sig: Inhale 2 puffs into the lungs every 6 (six) hours as needed for wheezing or shortness of breath.    Dispense:  8 g    Refill:  5    Order Specific Question:   Supervising Provider    Answer:   Danise Edge A [[id number]]   losartan (COZAAR) 50 MG tablet    Sig: Take 1 tablet (50 mg total) by mouth daily.    Order Specific Question:   Supervising Provider    Answer:   Danise Edge A [[id number]]

## 2023-02-10 NOTE — Assessment & Plan Note (Signed)
  Taking iron supplement once daily. -Continue current regimen.

## 2023-02-10 NOTE — Assessment & Plan Note (Addendum)
Recurrent, will send swab for GC/Chlamydia/trich/BV. Treatment pending results review.

## 2023-02-12 ENCOUNTER — Telehealth: Payer: Self-pay | Admitting: Family

## 2023-02-12 LAB — CERVICOVAGINAL ANCILLARY ONLY
Bacterial Vaginitis (gardnerella): POSITIVE — AB
Candida Glabrata: NEGATIVE
Candida Vaginitis: NEGATIVE
Chlamydia: NEGATIVE
Comment: NEGATIVE
Comment: NEGATIVE
Comment: NEGATIVE
Comment: NEGATIVE
Comment: NEGATIVE
Comment: NORMAL
Neisseria Gonorrhea: NEGATIVE
Trichomonas: NEGATIVE

## 2023-02-12 MED ORDER — METRONIDAZOLE 0.75 % VA GEL: 1.0000 | Freq: Every day | VAGINAL | 0 refills | Status: AC

## 2023-02-12 NOTE — Telephone Encounter (Signed)
See patient message

## 2023-02-19 ENCOUNTER — Ambulatory Visit: Payer: Medicaid Other | Attending: Family

## 2023-02-24 MED ORDER — FLUCONAZOLE 150 MG PO TABS: 150.0000 mg | ORAL_TABLET | Freq: Once | ORAL | 0 refills | Status: AC

## 2023-02-24 NOTE — Addendum Note (Signed)
Addended by: Sandford Craze on: 02/24/2023 12:47 PM   Modules accepted: Orders

## 2023-04-13 ENCOUNTER — Other Ambulatory Visit: Payer: Self-pay | Admitting: Family

## 2023-04-16 ENCOUNTER — Other Ambulatory Visit: Payer: Self-pay

## 2023-04-16 MED ORDER — POTASSIUM CHLORIDE CRYS ER 20 MEQ PO TBCR
20.0000 meq | EXTENDED_RELEASE_TABLET | Freq: Two times a day (BID) | ORAL | 1 refills | Status: AC
  Filled 2023-04-16: qty 180, 90d supply, fill #0

## 2023-06-02 ENCOUNTER — Other Ambulatory Visit: Payer: Self-pay | Admitting: Family

## 2023-06-04 ENCOUNTER — Other Ambulatory Visit (HOSPITAL_COMMUNITY): Payer: Self-pay

## 2023-06-04 ENCOUNTER — Other Ambulatory Visit (HOSPITAL_BASED_OUTPATIENT_CLINIC_OR_DEPARTMENT_OTHER): Payer: Self-pay

## 2023-06-04 MED ORDER — SPIRONOLACTONE 25 MG PO TABS
25.0000 mg | ORAL_TABLET | Freq: Every day | ORAL | 0 refills | Status: DC
  Filled 2023-06-04 (×2): qty 90, 90d supply, fill #0

## 2023-06-04 MED ORDER — LOSARTAN POTASSIUM 50 MG PO TABS: 50.0000 mg | ORAL_TABLET | Freq: Every day | ORAL | Status: DC

## 2023-06-04 MED ORDER — NIFEDIPINE ER OSMOTIC RELEASE 60 MG PO TB24
60.0000 mg | ORAL_TABLET | Freq: Every day | ORAL | 0 refills | Status: DC
  Filled 2023-06-04 (×2): qty 90, 90d supply, fill #0

## 2023-06-05 ENCOUNTER — Telehealth: Payer: Self-pay | Admitting: Emergency Medicine

## 2023-06-05 MED ORDER — LOSARTAN POTASSIUM 50 MG PO TABS: 50.0000 mg | ORAL_TABLET | Freq: Every day | ORAL | Status: DC

## 2023-06-05 NOTE — Telephone Encounter (Signed)
 Copied from CRM [id number]. Topic: Clinical - Medical Advice >> Jun 05, 2023  3:06 PM Thersia BROCKS wrote: Reason for CRM: patient called back about mediation saying that a couple of them has been denied she does not know why. Patient is asking for a callback for thr dr or nurse to call her back to explain

## 2023-06-05 NOTE — Telephone Encounter (Signed)
 Rx sent   Copied from CRM [id number]. Topic: Clinical - Medication Question >> Jun 05, 2023 10:57 AM Jennifer Mcgrath wrote: Reason for CRM: Patient called in regarding losartan  (COZAAR ) 50 MG tablet, states she requested 4 prescriptions be filled through MyChart, the others were filled but there Is push back for this medication. Patient stated just taking last pill today and concern if she will have to go to long without. Please call back at [phone]

## 2023-06-08 ENCOUNTER — Other Ambulatory Visit (HOSPITAL_BASED_OUTPATIENT_CLINIC_OR_DEPARTMENT_OTHER): Payer: Self-pay

## 2023-06-08 ENCOUNTER — Other Ambulatory Visit: Payer: Self-pay

## 2023-06-08 MED ORDER — SPIRONOLACTONE 25 MG PO TABS: 25.0000 mg | ORAL_TABLET | Freq: Every day | ORAL | 0 refills | Status: DC

## 2023-06-08 MED ORDER — NIFEDIPINE ER OSMOTIC RELEASE 60 MG PO TB24: 60.0000 mg | ORAL_TABLET | Freq: Every day | ORAL | 0 refills | Status: DC

## 2023-06-08 MED ORDER — LOSARTAN POTASSIUM 50 MG PO TABS: 50.0000 mg | ORAL_TABLET | Freq: Every day | ORAL | 0 refills | Status: DC

## 2023-06-08 NOTE — Telephone Encounter (Signed)
 Prescriptions sent to [facility] in [facility] at patient's request

## 2023-06-19 ENCOUNTER — Other Ambulatory Visit: Payer: Self-pay | Admitting: Family

## 2023-06-19 ENCOUNTER — Ambulatory Visit: Payer: Self-pay | Admitting: Family

## 2023-06-19 NOTE — Telephone Encounter (Signed)
Copied from CRM [id number]. Topic: Clinical - Medication Refill >> Jun 19, 2023  3:25 PM Drema Balzarine wrote: Most Recent Primary Care Visit:  Provider: O'SULLIVAN, MELISSA  Department: [facility]  Visit Type: OFFICE VISIT  Date: 02/10/2023  Medication: Losartan, NIFEdipine, Spironolactone   Has the patient contacted their pharmacy?  Yes, pharmacy says they never received approval from doctor but meds were sent on 06/08/23. Patient request meds to be sent to another pharmacy    Is this the correct pharmacy for this prescription? Yes  This is the patient's preferred pharmacy:   [id number] - [place], [place] - [address] AT [facility] OF [address] [place] [address] Phone: [phone] Fax: [phone]   Has the prescription been filled recently? Yes  Is the patient out of the medication? Yes, she's been out for 2 weeks  Has the patient been seen for an appointment in the last year OR does the patient have an upcoming appointment? Yes  Can we respond through MyChart? Yes  Agent: Please be advised that Rx refills may take up to 3 business days. We ask that you follow-up with your pharmacy.

## 2023-06-19 NOTE — Telephone Encounter (Signed)
  Chief Complaint: HTN Symptoms: currently none, but in the past she has had tightness in her chest or difficulty breathing Frequency: about a week she has noticed her BP has been high. Pertinent Negatives: Patient denies CP, SOB, HA, blurred vision, weakness  Disposition: [] ED /[] Urgent Care (no appt availability in office) / [] Appointment(In office/virtual)/ []  [facility]/ [x] Home Care/ [] Refused Recommended Disposition /[] [facility]/ []  Follow-up with PCP  Additional Notes: Pt last BP was last night and was 147/100. Pt was unable to take her BP at the time of the call d/t her not being home. Pt advised of parameters to call us back, pt agreeable and will call clinic back after taking 2 blood pressures 5 minutes apart.  Pt had requested a refill on her BP med in a timely fashion, however for unknown reason the medication was not filled. Pharm that pt normally uses is not open today. Agent placed request from the PCP for the RX to be sent to another pharm. Pt states that she has not had her BP medication for about 2 weeks.   Copied from CRM [id number]. Topic: Clinical - Red Word Triage >> Jun 19, 2023  3:24 PM Drema Balzarine wrote: Red Word that prompted transfer to Nurse Triage: Patient has been out of meds for 2 weeks, she has been having bad headaches and high blood pressure Reason for Disposition . Ran out of BP medications  Answer Assessment - Initial Assessment Questions 1. BLOOD PRESSURE: "What is the blood pressure?" "Did you take at least two measurements 5 minutes apart?"     Cannot check BP  2. ONSET: "When did you take your blood pressure?"     Last night, 147/100 3. HOW: "How did you take your blood pressure?" (e.g., automatic home BP monitor, visiting nurse)     Automatic  4. HISTORY: "Do you have a history of high blood pressure?"     yes 5. MEDICINES: "Are you taking any medicines for blood pressure?" "Have you missed any doses recently?"     2 weeks  without her BP meds 6. OTHER SYMPTOMS: "Do you have any symptoms?" (e.g., blurred vision, chest pain, difficulty breathing, headache, weakness)     Denies currently  Protocols used: Blood Pressure - High-A-AH

## 2023-07-07 ENCOUNTER — Other Ambulatory Visit: Payer: Self-pay | Admitting: Family

## 2023-10-07 ENCOUNTER — Other Ambulatory Visit: Payer: Self-pay | Admitting: Family

## 2023-10-07 NOTE — Telephone Encounter (Signed)
 Please contact pt to schedule follow up.

## 2023-10-08 NOTE — Telephone Encounter (Signed)
 Do you mind scheduling?

## 2023-10-08 NOTE — Telephone Encounter (Signed)
 Patient scheduled for 10/16/23

## 2023-10-16 ENCOUNTER — Ambulatory Visit: Admitting: Family

## 2023-10-23 ENCOUNTER — Ambulatory Visit (INDEPENDENT_AMBULATORY_CARE_PROVIDER_SITE_OTHER): Admitting: Family

## 2023-10-23 VITALS — BP 150/98 | HR 57 | Temp 99.2°F | Resp 16 | Ht 62.0 in | Wt 174.0 lb

## 2023-10-23 DIAGNOSIS — N951 Menopausal and female climacteric states: Secondary | ICD-10-CM | POA: Insufficient documentation

## 2023-10-23 DIAGNOSIS — N898 Other specified noninflammatory disorders of vagina: Secondary | ICD-10-CM

## 2023-10-23 DIAGNOSIS — D649 Anemia, unspecified: Secondary | ICD-10-CM

## 2023-10-23 DIAGNOSIS — I10 Essential (primary) hypertension: Secondary | ICD-10-CM | POA: Diagnosis not present

## 2023-10-23 DIAGNOSIS — N760 Acute vaginitis: Secondary | ICD-10-CM

## 2023-10-23 DIAGNOSIS — L309 Dermatitis, unspecified: Secondary | ICD-10-CM | POA: Insufficient documentation

## 2023-10-23 NOTE — Progress Notes (Signed)
 anci  Subjective:     Patient ID: Jennifer Mcgrath, female    DOB: 11/01/1975, 48 y.o.   MRN: [id number]  Chief Complaint  Patient presents with   Hypertension    Here for follow up   Hot Flashes    Will like to be checked for menopause   Vaginal Discharge    Complains of on and off vaginal discharge w/o any other symptoms.    HPI  Discussed the use of AI scribe software for clinical note transcription with the patient, who gave verbal consent to proceed.  History of Present Illness BP Readings from Last 3 Encounters:  10/23/23 (!) 150/98  02/10/23 132/80  11/21/22 137/89  Jennifer Mcgrath is a 48 year old female with hypertension who presents for a follow-up on blood pressure management.  She takes losartan  50 mg in the morning, and Aldactone  25 mg and nifedipine  60 mg at night. She missed her night doses for two nights, which may have contributed to her elevated blood pressure of 163/95 mmHg today. She recently refilled her losartan  prescription.  She experiences vaginal discharge with a strong odor in her urine but no itching or significant discomfort. She has a history of chlamydia.  She is concerned about menopause symptoms, including hot flashes, mood swings, and hair thinning. Her last menstrual period was on May 8th, 2025, and she continues to have regular periods. She experiences episodes of feeling very hot at night and mood swings affecting her interactions.  She has rashes, which she attributes to heat, and uses hydrocortisone cream for relief. She applies gentle moisturizers to keep her skin hydrated.       Health Maintenance Due  Topic Date Due   COVID-19 Vaccine (1) Never done   Pneumococcal Vaccine 78-57 Years old (1 of 2 - PCV) Never done   Zoster Vaccines- Shingrix (1 of 2) Never done   Colonoscopy  Never done    Past Medical History:  Diagnosis Date   Benign hematuria    Depression    GERD (gastroesophageal reflux disease)    Hypertension    Iron  deficiency anemia    Microscopic hematuria    PE (pulmonary thromboembolism) (HCC)    Umbilical hernia     Past Surgical History:  Procedure Laterality Date   CESAREAN SECTION  1992, 1999, 2003, 2008, 2020   TUBAL LIGATION  2020    Family History  Problem Relation Age of Onset   Diabetes Mellitus II Mother        borderline   Hematuria Father    Diabetes Mellitus II Father    Osteogenesis imperfecta Son     Social History   Socioeconomic History   Marital status: Single    Spouse name: Not on file   Number of children: Not on file   Years of education: Not on file   Highest education level: Not on file  Occupational History   Not on file  Tobacco Use   Smoking status: Never   Smokeless tobacco: Never  Vaping Use   Vaping status: Never Used  Substance and Sexual Activity   Alcohol use: Yes    Comment: occasional    Drug use: No   Sexual activity: Yes    Birth control/protection: Surgical  Other Topics Concern   Not on file  Social History Narrative   Works in Audiological scientist for a Programme researcher, broadcasting/film/video   5 children (3 boys and 2 girls)   3 Grandchildren   Enjoys reading the bible  No pets   Social Drivers of Corporate investment banker Strain: Not on file  Food Insecurity: Not on file  Transportation Needs: Not on file  Physical Activity: Unknown (04/12/2019)   [facility] prior to 08/02/2022., [facility] visits prior to 08/02/2022.   Exercise Vital Sign    Days of Exercise per Week: Patient refused    Minutes of Exercise per Session: Patient refused  Stress: Unknown (04/12/2019)   [facility] visits prior to 08/02/2022., [facility] visits prior to 08/02/2022.   [employer] of Occupational Health - Occupational Stress Questionnaire    Feeling of Stress : Patient refused  Social Connections: Unknown (04/12/2019)   [facility] prior to 08/02/2022., [facility] visits prior to 08/02/2022.   Social Connection and Isolation Panel [NHANES]    Frequency of Communication with Friends and Family: Patient refused    Frequency of Social Gatherings with Friends and Family: Patient refused    Attends Religious Services: Patient refused    Active Member of Clubs or Organizations: Patient refused    Attends Banker Meetings: Patient refused    Marital Status: Patient refused  Intimate Partner Violence: Unknown (04/12/2019)   [facility] visits prior to 08/02/2022., [facility] visits prior to 08/02/2022.   Humiliation, Afraid, Rape, and Kick questionnaire    Fear of Current or Ex-Partner: Patient refused    Emotionally Abused: Patient refused    Physically Abused: Patient refused    Sexually Abused: Patient refused    Outpatient Medications Prior to Visit  Medication Sig Dispense Refill   albuterol  (VENTOLIN  HFA) 108 (90 Base) MCG/ACT inhaler Inhale 2 puffs into the lungs every 6 (six) hours as needed for wheezing or shortness of breath. 8 g 5   Ascorbic Acid (VITAMIN C) 1000 MG tablet Take 1,000 mg by mouth daily.     Ferrous Sulfate 140 (45 Fe) MG TBCR Take 1 tablet by mouth daily.     losartan  (COZAAR ) 50 MG tablet TAKE 1 TABLET(50 MG) BY MOUTH DAILY 30 tablet 0   magnesium  oxide (MAG-OX) 400 (240 Mg) MG tablet Take 1 tablet by mouth 2 (two) times daily.     meclizine  (ANTIVERT ) 25 MG tablet Take 1 tablet (25 mg total) by mouth 3 (three) times daily as needed for dizziness. 30 tablet 0   NIFEdipine  (PROCARDIA  XL/NIFEDICAL XL) 60 MG 24 hr tablet TAKE 1 TABLET(60 MG) BY MOUTH DAILY 30 tablet 0   nystatin  (MYCOSTATIN /NYSTOP ) powder Apply 1 Application topically 2 (two) times daily. 30 g 1   Omega-3 Fatty Acids (FISH OIL) 1000 MG CAPS Take by mouth.     potassium chloride  SA (KLOR-CON  M) 20 MEQ tablet Take 1 tablet  (20 mEq total) by mouth 2 (two) times daily. 180 tablet 1   spironolactone  (ALDACTONE ) 25 MG tablet Take 1 tablet (25 mg total) by mouth daily. 90 tablet 0   zinc gluconate 50 MG tablet Take 50 mg by mouth daily.     No facility-administered medications prior to visit.    No Known Allergies  ROS See HPI    Objective:     Physical Exam Constitutional:      General: She is not in acute distress.    Appearance: Normal appearance. She is well-developed.  HENT:  Head: Normocephalic and atraumatic.     Right Ear: External ear normal.     Left Ear: External ear normal.  Eyes:     General: No scleral icterus. Neck:     Thyroid: No thyromegaly.  Cardiovascular:     Rate and Rhythm: Normal rate and regular rhythm.     Heart sounds: Normal heart sounds. No murmur heard. Pulmonary:     Effort: Pulmonary effort is normal. No respiratory distress.     Breath sounds: Normal breath sounds. No wheezing.  Musculoskeletal:     Cervical back: Neck supple.  Skin:    General: Skin is warm and dry.     Comments: Dry eczematous rash noted posterior neck  Neurological:     Mental Status: She is alert and oriented to person, place, and time.  Psychiatric:        Mood and Affect: Mood normal.        Behavior: Behavior normal.        Thought Content: Thought content normal.        Judgment: Judgment normal.      BP (!) 150/98   Pulse (!) 57   Temp 99.2 F (37.3 C) (Oral)   Resp 16   Ht 5\' 2"  (1.575 m)   Wt 174 lb (78.9 kg)   SpO2 100%   BMI 31.83 kg/m  Wt Readings from Last 3 Encounters:  10/23/23 174 lb (78.9 kg)  02/10/23 177 lb (80.3 kg)  11/21/22 178 lb (80.7 kg)       Assessment & Plan:   Problem List Items Addressed This Visit       Unprioritized   Vaginal discharge   Swab sent for bv/yeast/GC/Chlamydia.      Primary hypertension   BP elevated today but pt admits to missing the last 2 nights of nifedipine .  Restart nifedipine , continue cozaar .  Follow up in 2  weeks for nurse visit BP check.       Relevant Orders   Comp Met (CMET)   Perimenopause   Symptoms consistent with perimenopause. Offered OCP but she declines a symptoms are not that severe.  Continue to monitor.        Eczema    Rash consistent with eczema. - Continue using otc hydrocortisone cream on the rash. - Keep the affected area well moisturized with gentle creams such as CeraVe or Eucerin.      Anemia   Relevant Orders   CBC w/Diff   Iron, TIBC and Ferritin Panel   Other Visit Diagnoses       Acute vaginitis    -  Primary   Relevant Orders   Urine Culture   SureSwab Advanced Vaginitis Plus,TMA       I am having Jennifer Mcgrath maintain her Ferrous Sulfate, magnesium  oxide, zinc gluconate, Fish Oil, vitamin C, meclizine , nystatin , albuterol , potassium chloride  SA, spironolactone , losartan , and NIFEdipine .  No orders of the defined types were placed in this encounter.

## 2023-10-23 NOTE — Assessment & Plan Note (Addendum)
 Symptoms consistent with perimenopause. Offered OCP but she declines a symptoms are not that severe.  Continue to monitor.

## 2023-10-23 NOTE — Assessment & Plan Note (Addendum)
 BP elevated today but pt admits to missing the last 2 nights of nifedipine .  Restart nifedipine , continue cozaar .  Follow up in 2 weeks for nurse visit BP check.

## 2023-10-23 NOTE — Patient Instructions (Signed)
 VISIT SUMMARY:  Today, we discussed your blood pressure management, vaginal discharge, perimenopausal symptoms, and eczema. We reviewed your medications and addressed your concerns about menopause and skin rashes.  YOUR PLAN:  HYPERTENSION: Your blood pressure was elevated today, likely due to missed doses of your medications. -Recheck your blood pressure in two weeks to ensure you are taking your medications as prescribed and to get accurate readings. -If your blood pressure remains high, we may consider increasing your losartan  dosage.  VAGINAL DISCHARGE: You reported vaginal discharge with a strong odor but no itching. -We will do a urine culture to check for a urinary tract infection (UTI). -We will also perform a vaginal swab to check for infections.  PERIMENOPAUSAL SYMPTOMS: You are experiencing symptoms like hot flashes, mood swings, and night sweats, which are consistent with perimenopause. -Monitor your symptoms. If they become unbearable, we may consider low-dose birth control pills. -If your mood swings and hot flashes worsen, we may consider low-dose antidepressants.  ECZEMA: You have a rash that is likely eczema, possibly worsened by heat. -Continue using hydrocortisone cream on the rash. -Keep the affected area well moisturized with gentle creams like CeraVe or Eucerin.

## 2023-10-23 NOTE — Assessment & Plan Note (Signed)
  Rash consistent with eczema. - Continue using otc hydrocortisone cream on the rash. - Keep the affected area well moisturized with gentle creams such as CeraVe or Eucerin.

## 2023-10-23 NOTE — Assessment & Plan Note (Signed)
 Swab sent for bv/yeast/GC/Chlamydia.

## 2023-10-24 LAB — COMPREHENSIVE METABOLIC PANEL WITH GFR
AG Ratio: 1.2 (calc) (ref 1.0–2.5)
ALT: 14 U/L (ref 6–29)
AST: 19 U/L (ref 10–35)
Albumin: 4 g/dL (ref 3.6–5.1)
Alkaline phosphatase (APISO): 45 U/L (ref 37–153)
BUN: 16 mg/dL (ref 7–25)
CO2: 28 mmol/L (ref 20–32)
Calcium: 9.5 mg/dL (ref 8.6–10.4)
Chloride: 103 mmol/L (ref 98–110)
Creat: 0.89 mg/dL (ref 0.50–1.03)
Globulin: 3.4 g/dL (ref 1.9–3.7)
Glucose, Bld: 79 mg/dL (ref 65–99)
Potassium: 3.9 mmol/L (ref 3.5–5.3)
Sodium: 139 mmol/L (ref 135–146)
Total Bilirubin: 0.7 mg/dL (ref 0.2–1.2)
Total Protein: 7.4 g/dL (ref 6.1–8.1)
eGFR: 79 mL/min/{1.73_m2} (ref >=60)

## 2023-10-24 LAB — IRON,TIBC AND FERRITIN PANEL
%SAT: 28 % (ref 16–45)
Ferritin: 19 ng/mL (ref 16–232)
Iron: 98 ug/dL (ref 45–160)
TIBC: 353 ug/dL (ref 250–450)

## 2023-10-24 LAB — CBC WITH DIFFERENTIAL/PLATELET
Absolute Lymphocytes: 2477 {cells}/uL (ref 850–3900)
Absolute Monocytes: 330 {cells}/uL (ref 200–950)
Basophils Absolute: 31 {cells}/uL (ref 0–200)
Basophils Relative: 0.7 %
Eosinophils Absolute: 110 {cells}/uL (ref 15–500)
Eosinophils Relative: 2.5 %
HCT: 37.8 % (ref 35.0–45.0)
Hemoglobin: 12.1 g/dL (ref 11.7–15.5)
MCH: 29.2 pg (ref 27.0–33.0)
MCHC: 32 g/dL (ref 32.0–36.0)
MCV: 91.1 fL (ref 80.0–100.0)
MPV: 10.7 fL (ref 7.5–12.5)
Monocytes Relative: 7.5 %
Neutro Abs: 1452 {cells}/uL — ABNORMAL LOW (ref 1500–7800)
Neutrophils Relative %: 33 %
Platelets: 316 10*3/uL (ref 140–400)
RBC: 4.15 10*6/uL (ref 3.80–5.10)
RDW: 12.2 % (ref 11.0–15.0)
Total Lymphocyte: 56.3 %
WBC: 4.4 10*3/uL (ref 3.8–10.8)

## 2023-10-24 LAB — SURESWAB® ADVANCED VAGINITIS PLUS,TMA
C. trachomatis RNA, TMA: NOT DETECTED
CANDIDA SPECIES: NOT DETECTED
Candida glabrata: NOT DETECTED
N. gonorrhoeae RNA, TMA: NOT DETECTED
SURESWAB(R) ADV BACTERIAL VAGINOSIS(BV),TMA: POSITIVE — AB
TRICHOMONAS VAGINALIS (TV),TMA: NOT DETECTED

## 2023-10-25 LAB — URINE CULTURE
MICRO NUMBER:: 16495042
SPECIMEN QUALITY:: ADEQUATE

## 2023-10-26 ENCOUNTER — Ambulatory Visit: Payer: Self-pay | Admitting: Family

## 2023-10-26 DIAGNOSIS — N3000 Acute cystitis without hematuria: Secondary | ICD-10-CM

## 2023-10-26 MED ORDER — NITROFURANTOIN MONOHYD MACRO 100 MG PO CAPS
100.0000 mg | ORAL_CAPSULE | Freq: Two times a day (BID) | ORAL | 0 refills | Status: DC
Start: 2023-10-26 — End: 2024-04-06

## 2023-11-18 ENCOUNTER — Other Ambulatory Visit: Payer: Self-pay | Admitting: Family

## 2023-12-10 ENCOUNTER — Emergency Department (HOSPITAL_BASED_OUTPATIENT_CLINIC_OR_DEPARTMENT_OTHER)
Admission: EM | Admit: 2023-12-10 | Discharge: 2023-12-11 | Disposition: A | Discharge status: Home / Self Care | Attending: Emergency Medicine | Admitting: Emergency Medicine

## 2023-12-10 ENCOUNTER — Emergency Department (HOSPITAL_BASED_OUTPATIENT_CLINIC_OR_DEPARTMENT_OTHER)

## 2023-12-10 ENCOUNTER — Other Ambulatory Visit: Payer: Self-pay

## 2023-12-10 ENCOUNTER — Encounter (HOSPITAL_BASED_OUTPATIENT_CLINIC_OR_DEPARTMENT_OTHER): Payer: Self-pay

## 2023-12-10 DIAGNOSIS — Z79899 Other long term (current) drug therapy: Secondary | ICD-10-CM | POA: Insufficient documentation

## 2023-12-10 DIAGNOSIS — R202 Paresthesia of skin: Secondary | ICD-10-CM | POA: Insufficient documentation

## 2023-12-10 DIAGNOSIS — I10 Essential (primary) hypertension: Secondary | ICD-10-CM | POA: Diagnosis not present

## 2023-12-10 DIAGNOSIS — R200 Anesthesia of skin: Secondary | ICD-10-CM | POA: Insufficient documentation

## 2023-12-10 LAB — COMPREHENSIVE METABOLIC PANEL WITH GFR
ALT: 13 U/L (ref 0–44)
AST: 21 U/L (ref 15–41)
Albumin: 4.1 g/dL (ref 3.5–5.0)
Alkaline Phosphatase: 61 U/L (ref 38–126)
Anion gap: 12 (ref 5–15)
BUN: 17 mg/dL (ref 6–20)
CO2: 26 mmol/L (ref 22–32)
Calcium: 9.4 mg/dL (ref 8.9–10.3)
Chloride: 102 mmol/L (ref 98–111)
Creatinine, Ser: 0.91 mg/dL (ref 0.44–1.00)
GFR, Estimated: >60 mL/min (ref >60)
Glucose, Bld: 86 mg/dL (ref 70–99)
Potassium: 3.8 mmol/L (ref 3.5–5.1)
Sodium: 140 mmol/L (ref 135–145)
Total Bilirubin: 0.3 mg/dL (ref 0.0–1.2)
Total Protein: 7.7 g/dL (ref 6.5–8.1)

## 2023-12-10 LAB — DIFFERENTIAL
Abs Immature Granulocytes: 0.02 K/uL (ref 0.00–0.07)
Basophils Absolute: 0 K/uL (ref 0.0–0.1)
Basophils Relative: 1 %
Eosinophils Absolute: 0.1 K/uL (ref 0.0–0.5)
Eosinophils Relative: 2 %
Immature Granulocytes: 0 %
Lymphocytes Relative: 45 %
Lymphs Abs: 2.9 K/uL (ref 0.7–4.0)
Monocytes Absolute: 0.5 K/uL (ref 0.1–1.0)
Monocytes Relative: 8 %
Neutro Abs: 2.8 K/uL (ref 1.7–7.7)
Neutrophils Relative %: 44 %

## 2023-12-10 LAB — CBC
HCT: 36.7 % (ref 36.0–46.0)
Hemoglobin: 12.1 g/dL (ref 12.0–15.0)
MCH: 29.7 pg (ref 26.0–34.0)
MCHC: 33 g/dL (ref 30.0–36.0)
MCV: 90.2 fL (ref 80.0–100.0)
Platelets: 306 K/uL (ref 150–400)
RBC: 4.07 MIL/uL (ref 3.87–5.11)
RDW: 13.2 % (ref 11.5–15.5)
WBC: 6.4 K/uL (ref 4.0–10.5)
nRBC: 0 % (ref 0.0–0.2)

## 2023-12-10 LAB — CBG MONITORING, ED: Glucose-Capillary: 73 mg/dL (ref 70–99)

## 2023-12-10 MED ORDER — METOCLOPRAMIDE HCL 5 MG/ML IJ SOLN
10.0000 mg | Freq: Once | INTRAMUSCULAR | Status: AC
  Administered 2023-12-10: 10 mg via INTRAVENOUS
  Filled 2023-12-10: qty 2

## 2023-12-10 NOTE — ED Triage Notes (Signed)
 Pt reports today around 4pm she started feeling numbness to left side face and left arm and left leg. Pt reports clogged feeling in ear. Pt also reports headache, general fatigue and some chest pain. Pt able to move all extremities equally with no focal weakness. Pt with clear speech.

## 2023-12-10 NOTE — ED Provider Notes (Signed)
 [facility] [place] Provider Note   CSN: [id number] Arrival date & time: 12/10/23  2247     Patient presents with: Numbness   Jennifer Mcgrath is a 48 y.o. female.   The history is provided by the patient and medical records.  Jennifer Mcgrath is a 48 y.o. female who presents to the Emergency Department complaining of numbness.  She presents to the emergency department for evaluation of left-sided numbness and tingling that started around 4 PM.  She reports associated left-sided headache that is 6 out of 10 in nature.  Numbness involves her left face, arm and leg.  No associated weakness.  She feels like her hearing is muffled in her left ear.  She has ongoing intermittent chest pressure and shortness of breath, which is also present.  No fever, nausea, vomiting, weakness, dysuria.  She has a history of hypertension.  She did take a Goody powder prior to ED arrival.  No tobacco, rare alcohol, no drugs.  Family history of hypertension and diabetes.  No prior similar symptoms.     Prior to Admission medications   Medication Sig Start Date End Date Taking? Authorizing Provider  albuterol  (VENTOLIN  HFA) 108 (90 Base) MCG/ACT inhaler Inhale 2 puffs into the lungs every 6 (six) hours as needed for wheezing or shortness of breath. 02/10/23   Daryl Setter, NP  Ascorbic Acid (VITAMIN C) 1000 MG tablet Take 1,000 mg by mouth daily.    [provider]  Ferrous Sulfate 140 (45 Fe) MG TBCR Take 1 tablet by mouth daily. 08/11/22   [provider]  losartan  (COZAAR ) 50 MG tablet TAKE 1 TABLET(50 MG) BY MOUTH DAILY 11/18/23   Daryl Setter, NP  magnesium  oxide (MAG-OX) 400 (240 Mg) MG tablet Take 1 tablet by mouth 2 (two) times daily. 05/23/22   [provider]  meclizine  (ANTIVERT ) 25 MG tablet Take 1 tablet (25 mg total) by mouth 3 (three) times daily as needed for dizziness. 11/21/22   O'Sullivan, Melissa, NP  NIFEdipine  (PROCARDIA   XL/NIFEDICAL XL) 60 MG 24 hr tablet TAKE 1 TABLET(60 MG) BY MOUTH DAILY 11/18/23   Daryl Setter, NP  nitrofurantoin , macrocrystal-monohydrate, (MACROBID ) 100 MG capsule Take 1 capsule (100 mg total) by mouth 2 (two) times daily. 10/26/23   Daryl Setter, NP  nystatin  (MYCOSTATIN /NYSTOP ) powder Apply 1 Application topically 2 (two) times daily. 11/21/22   O'Sullivan, Melissa, NP  Omega-3 Fatty Acids (FISH OIL) 1000 MG CAPS Take by mouth.    [provider]  potassium chloride  SA (KLOR-CON  M) 20 MEQ tablet Take 1 tablet (20 mEq total) by mouth 2 (two) times daily. 04/16/23   O'Sullivan, Melissa, NP  spironolactone  (ALDACTONE ) 25 MG tablet Take 1 tablet (25 mg total) by mouth daily. 06/08/23   O'Sullivan, Melissa, NP  zinc gluconate 50 MG tablet Take 50 mg by mouth daily.    [provider]    Allergies: Patient has no known allergies.    Review of Systems  All other systems reviewed and are negative.   Updated Vital Signs BP (!) 147/104   Pulse 61   Temp 98.2 F (36.8 C) (Oral)   Resp 20   Ht 5' 2 (1.575 m)   Wt 79.4 kg   LMP 12/01/2023 (Exact Date)   SpO2 100%   BMI 32.01 kg/m   Physical Exam Vitals and nursing note reviewed.  Constitutional:      Appearance: She is well-developed.  HENT:     Head: Normocephalic  and atraumatic.     Right Ear: Tympanic membrane normal.     Left Ear: Tympanic membrane normal.  Cardiovascular:     Rate and Rhythm: Normal rate and regular rhythm.  Pulmonary:     Effort: Pulmonary effort is normal. No respiratory distress.  Abdominal:     Palpations: Abdomen is soft.     Tenderness: There is no abdominal tenderness. There is no guarding or rebound.  Musculoskeletal:        General: No tenderness.     Cervical back: Neck supple.  Skin:    General: Skin is warm and dry.  Neurological:     Mental Status: She is alert and oriented to person, place, and time.     Comments: No asymmetry of facial movements.  Visual  fields grossly intact.  5 out of 5 strength in all 4 extremities with sensation to light touch intact in all 4 extremities  Psychiatric:        Behavior: Behavior normal.     (all labs ordered are listed, but only abnormal results are displayed) Labs Reviewed  CBC  DIFFERENTIAL  COMPREHENSIVE METABOLIC PANEL WITH GFR  CBG MONITORING, ED    EKG: EKG Interpretation Date/Time:  Thursday December 10 2023 22:55:30 EDT Ventricular Rate:  74 PR Interval:  160 QRS Duration:  79 QT Interval:  418 QTC Calculation: 464 R Axis:   90  Text Interpretation: Sinus rhythm Borderline right axis deviation Borderline repolarization abnormality Confirmed by Griselda Norris [id number]) on 12/11/2023 12:07:45 AM  Radiology:   CT HEAD WO CONTRAST Result Date: 12/10/2023 CLINICAL DATA:  Headache EXAM: CT HEAD WITHOUT CONTRAST TECHNIQUE: Contiguous axial images were obtained from the base of the skull through the vertex without intravenous contrast. RADIATION DOSE REDUCTION: This exam was performed according to the departmental dose-optimization program which includes automated exposure control, adjustment of the mA and/or kV according to patient size and/or use of iterative reconstruction technique. COMPARISON:  CT brain 12/03/2021, MRI 07/12/2012 FINDINGS: Brain: No evidence of acute infarction, hemorrhage, hydrocephalus, extra-axial collection or mass lesion/mass effect. Vascular: No hyperdense vessel or unexpected calcification. Skull: Normal. Negative for fracture or focal lesion. Sinuses/Orbits: No acute finding. Other: None IMPRESSION: Negative non contrasted CT appearance of the brain. Electronically Signed   By: Luke Bun M.D.   On: 12/10/2023 23:45     Procedures   Medications Ordered in the ED  metoCLOPramide  (REGLAN ) injection 10 mg (10 mg Intravenous Given 12/10/23 2315)  LORazepam  (ATIVAN ) injection 2 mg (2 mg Intravenous Given 12/11/23 0258)  gadobutrol  (GADAVIST ) 1 MMOL/ML injection 8 mL (8 mLs  Intravenous Contrast Given 12/11/23 0327)                                    Medical Decision Making Amount and/or Complexity of Data Reviewed Labs: ordered. Radiology: ordered.  Risk Prescription drug management.   Patient with history of hypertension here for evaluation of headache, paresthesias.  She is nontoxic-appearing on evaluation with no focal neurologic deficits.  CT head is negative for acute abnormality.  Headache is partially improved after treatment in the emergency department.  Discussed the patient with on-call neurologist, recommendation for MRI to rule out CVA versus demyelinating lesion.  MRI is not available at this facility.  Plan to transfer to the [facility] emergency department for additional imaging.  Discussed with Dr. Darra in the [facility] emergency department, who accepts the patient  in transfer.  If MRI is unremarkable patient is stable for discharge with outpatient follow-up.     Final diagnoses:  Paresthesias  Numbness    ED Discharge Orders          Ordered    Ambulatory referral to Neurology       Comments: An appointment is requested in approximately: 2 weeks   12/11/23 0426               Griselda Norris, MD 12/11/23 [phone]

## 2023-12-11 ENCOUNTER — Emergency Department (HOSPITAL_COMMUNITY)

## 2023-12-11 MED ORDER — GADOBUTROL 1 MMOL/ML IV SOLN
8.0000 mL | Freq: Once | INTRAVENOUS | Status: AC | PRN
  Administered 2023-12-11: 8 mL via INTRAVENOUS

## 2023-12-11 MED ORDER — LORAZEPAM 2 MG/ML IJ SOLN
2.0000 mg | Freq: Once | INTRAMUSCULAR | Status: AC
  Administered 2023-12-11: 2 mg via INTRAVENOUS
  Filled 2023-12-11: qty 1

## 2023-12-11 NOTE — ED Notes (Signed)
 Patient transported to MRI

## 2023-12-11 NOTE — ED Notes (Signed)
 MRI called

## 2023-12-11 NOTE — ED Notes (Signed)
 Pt given instruction for POV transfer to [facility] ER. Report called to charge RN.

## 2023-12-11 NOTE — ED Provider Notes (Signed)
 Patient transferred from [facility] for numbness in multiple areas to rule out CNS causes. MRI is negative. Patient awakes to voice, feels well. Will fu w/ neurology.    Jennifer Mcgrath, Selinda, MD 12/11/23 [phone]

## 2023-12-15 ENCOUNTER — Other Ambulatory Visit: Payer: Self-pay | Admitting: Family

## 2023-12-15 DIAGNOSIS — H8113 Benign paroxysmal vertigo, bilateral: Secondary | ICD-10-CM

## 2023-12-22 ENCOUNTER — Other Ambulatory Visit: Payer: Self-pay | Admitting: Family

## 2023-12-22 MED ORDER — SPIRONOLACTONE 25 MG PO TABS: 25.0000 mg | ORAL_TABLET | Freq: Every day | ORAL | 0 refills | Status: DC

## 2023-12-22 NOTE — Telephone Encounter (Signed)
 Copied from CRM [id number]. Topic: Clinical - Medication Refill >> Dec 22, 2023  2:43 PM Leah C wrote: Medication: spironolactone  (ALDACTONE ) 25 MG tablet   Has the patient contacted their pharmacy? Pharmacy called and stated that they didn't receive the request.  (Agent: If no, request that the patient contact the pharmacy for the refill. If patient does not wish to contact the pharmacy document the reason why and proceed with request.) (Agent: If yes, when and what did the pharmacy advise?)  This is the patient's preferred pharmacy:  [id number] - [place], [place] - [address] [place] [address] Phone: [phone] Fax: [phone]   Is this the correct pharmacy for this prescription? Yes If no, delete pharmacy and type the correct one.   Has the prescription been filled recently? Yes  Is the patient out of the medication? Yes  Has the patient been seen for an appointment in the last year OR does the patient have an upcoming appointment? Yes  Can we respond through MyChart? Yes  Agent: Please be advised that Rx refills may take up to 3 business days. We ask that you follow-up with your pharmacy.

## 2023-12-30 ENCOUNTER — Encounter: Payer: Self-pay | Admitting: Neurology

## 2024-01-28 NOTE — Progress Notes (Signed)
 Initial neurology clinic note  Reason for Evaluation: Consultation requested by Mesner, Selinda, MD for an opinion regarding headache with left sided numbness. My final recommendations will be communicated back to the requesting physician by way of shared medical record or letter to requesting physician via [place]  mail.  HPI: This is Ms. Jennifer Mcgrath, a 48 y.o. right-handed female with a medical history of HTN, PE (2020), GERD who presents to neurology clinic with the chief complaint of headaches and numbness. The patient is alone today.  Patient's symptoms started at least 10 years ago. In 2020 when she had blood clots in her lungs she felt it got more noticeable. She gave birth and also had COVID around this time. She started having headaches and numbness in her left arm. She will have chest pain and shortness of breath as well. She feels like symptoms were sometimes associated with high blood pressure. In 12/10/23 she had a left sided headache with left sided numbness and tingling in face, arm, and leg. She went to ED. Labs, CT head, and MRI brain w/wo contrast were normal. She has not had these diffuse numbness since.   She occasionally gets black spots in her vision. She is not sure if this is associated with her headaches.  The numbness and headaches do not always come together. She describes her headache has left sided tension, throbbing. She rates the pain as 4/10. She endorses neck pain with the headache. She endorses photophobia, phonophobia, and nausea with her headaches. She sometimes has dizziness like every she is on a boat rocking with her headaches. She may also blurry vision but no loss of vision. Her headache will last a few hours. She will take ibuprofen  or Goody's powder and sometimes meclizine  and will lay down. These do help. She takes meclizine  about twice a month. She will take OTC NSAID 2-3 times per week. She will have a headache at least 6-7 times per month.  Regarding her  sleep, she sleeps about 8 hours per night. She endorses snoring. She does not feel well rested when she wakes. She feels tired throughout the day and will take naps if able.  Regarding mood, she endorses anxiety and depression.  Smoker: No OCP/hormone use: no Caffiene use: 2-3 sodas per week, occasional tea EtOH use: 1-2 beers on weekends; previously more Restrictive diet: no Family history of neurologic disease including headaches: No   MEDICATIONS:  Outpatient Encounter Medications as of 02/05/2024  Medication Sig   albuterol  (VENTOLIN  HFA) 108 (90 Base) MCG/ACT inhaler Inhale 2 puffs into the lungs every 6 (six) hours as needed for wheezing or shortness of breath.   Ascorbic Acid (VITAMIN C) 1000 MG tablet Take 1,000 mg by mouth daily.   Ferrous Sulfate 140 (45 Fe) MG TBCR Take 1 tablet by mouth daily.   losartan  (COZAAR ) 50 MG tablet TAKE 1 TABLET(50 MG) BY MOUTH DAILY   magnesium  oxide (MAG-OX) 400 (240 Mg) MG tablet Take 1 tablet by mouth 2 (two) times daily.   meclizine  (ANTIVERT ) 25 MG tablet TAKE 1 TABLET(25 MG) BY MOUTH THREE TIMES DAILY AS NEEDED FOR DIZZINESS   NIFEdipine  (PROCARDIA  XL/NIFEDICAL XL) 60 MG 24 hr tablet TAKE 1 TABLET(60 MG) BY MOUTH DAILY   nystatin  (MYCOSTATIN /NYSTOP ) powder Apply 1 Application topically 2 (two) times daily. (Patient taking differently: Apply 1 Application topically as needed.)   Omega-3 Fatty Acids (FISH OIL) 1000 MG CAPS Take by mouth. (Patient taking differently: Take by mouth as needed.)   potassium chloride   SA (KLOR-CON  M) 20 MEQ tablet Take 1 tablet (20 mEq total) by mouth 2 (two) times daily.   spironolactone  (ALDACTONE ) 25 MG tablet Take 1 tablet (25 mg total) by mouth daily.   zinc gluconate 50 MG tablet Take 50 mg by mouth daily. (Patient taking differently: Take 50 mg by mouth as needed.)   nitrofurantoin , macrocrystal-monohydrate, (MACROBID ) 100 MG capsule Take 1 capsule (100 mg total) by mouth 2 (two) times daily. (Patient not taking:  Reported on 02/05/2024)   No facility-administered encounter medications on file as of 02/05/2024.    PAST MEDICAL HISTORY: Past Medical History:  Diagnosis Date   Benign hematuria    Depression    GERD (gastroesophageal reflux disease)    Hypertension    Iron deficiency anemia    Microscopic hematuria    PE (pulmonary thromboembolism) (HCC)    Umbilical hernia     PAST SURGICAL HISTORY: Past Surgical History:  Procedure Laterality Date   CESAREAN SECTION  1992, 1999, 2003, 2008, 2020   TUBAL LIGATION  2020    ALLERGIES: No Known Allergies  FAMILY HISTORY: Family History  Problem Relation Age of Onset   Diabetes Mellitus II Mother        borderline   Hematuria Father    Diabetes Mellitus II Father    Osteogenesis imperfecta Son     SOCIAL HISTORY: Social History   Tobacco Use   Smoking status: Never   Smokeless tobacco: Never  Vaping Use   Vaping status: Never Used  Substance Use Topics   Alcohol use: Yes    Comment: occasional    Drug use: No   Social History   Social History Narrative   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  Patient appears well-groomed.   Head:  Normocephalic/atraumatic Eyes:  fundi examined, disc margins clear, no obvious papilledema  Neck: supple, positive for paraspinal tenderness, decreased range of motion Heart: regular rate and rhythm Lungs: Clear to auscultation bilaterally. Vascular: No carotid bruits.  Neurological Exam: Mental status: alert and oriented, speech fluent and not dysarthric, language intact.  Cranial nerves: CN I: not tested CN II: pupils equal, round and reactive to light, visual fields intact CN III, IV, VI:  full range of  motion, no nystagmus, no ptosis CN V: facial sensation intact. CN VII: upper and lower face symmetric CN VIII: hearing intact CN IX, X: uvula midline CN XI: sternocleidomastoid and trapezius muscles intact CN XII: tongue midline  Bulk & Tone: normal, no fasciculations. Motor:  muscle strength 5/5 throughout Deep Tendon Reflexes:  2+ throughout,  toes downgoing.   Sensation:  Pinprick, vibratory, and proprioceptive sensation intact. Finger to nose testing:  Without dysmetria.   Gait:  Normal station and stride.  Romberg negative.   Lab and Test Review: Internal labs: 12/10/23: CMP unremarkable CBC unremarkable  Ferritin (10/23/23): 19  11/21/22: TSH wnl Vit D wnl B12: 651  External labs: Lipid panel (07/07/22): tChol 121, LDL 55, TG 56  Imaging/Procedures: CT head wo contrast (12/10/23): Negative non contrasted CT appearance of the brain.   MRI brain  w/wo contrast (12/11/23): IMPRESSION: 1. No acute intracranial abnormality. 2. No mass or abnormal enhancement.  ASSESSMENT: Damia Bobrowski is a 48 y.o. female who presents for evaluation of left sided headache and intermittent numbness of left side of body. She has a relevant medical history of HTN, PE (2020), GERD. Her neurological examination is normal today. Available diagnostic data is significant for MRI brain that was normal. Patient's headaches sound like migraine, perhaps with aura. She likely has contributions from cervicalgia. She may also have undiagnosed sleep apnea that could contribute. Migraine could explain intermittent numbness and vertigo. She is currently having 7-8 headaches per month so would benefit from a preventative medication.  PLAN: -Blood work: TSH, B12, B1 -Sleep medicine referral -Physical therapy for cervicalgia -For migraines: Migraine prevention:  Start nortriptyline  10 mg at bedtime for 1 week, then increase to 20 mg at bedtime thereafter Migraine rescue:  Sumatriptan  100 mg as needed at headache  onset, can repeat in 2 hours if needed Limit use of pain relievers to no more than 2 days out of week to prevent risk of rebound or medication-overuse headache. Keep headache diary   -Return to clinic in 3 months  The impression above as well as the plan as outlined below were extensively discussed with the patient who voiced understanding. All questions were answered to their satisfaction.  When available, results of the above investigations and possible further recommendations will be communicated to the patient via telephone/MyChart. Patient to call office if not contacted after expected testing turnaround time.   Total time spent reviewing records, interview, history/exam, documentation, and coordination of care on day of encounter:  45 min   Thank you for allowing me to participate in patient's care.  If I can answer any additional questions, I would be pleased to do so.  Venetia Potters, MD   CC: Daryl Setter, NP [address] [place] [address]  CC: Referring provider: Lorette Mayo, MD [address] [place],  [place] [address]

## 2024-02-01 IMAGING — CT CT HEAD W/O CM
3 series · 15 of 47 positions shown, 18 images · non-contrast
Comparison: Head CT 09/09/2020

CLINICAL DATA: Worsening headache.



[Series 2: head wo · axial · 0.46mm/px · z∈[-40,+100]mm · 9 of 34 slices shown, 12 images]
[im 3/34  brain]
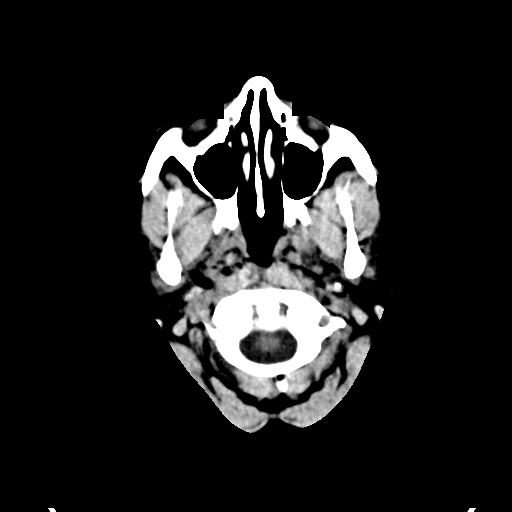
[im 3/34  bone]
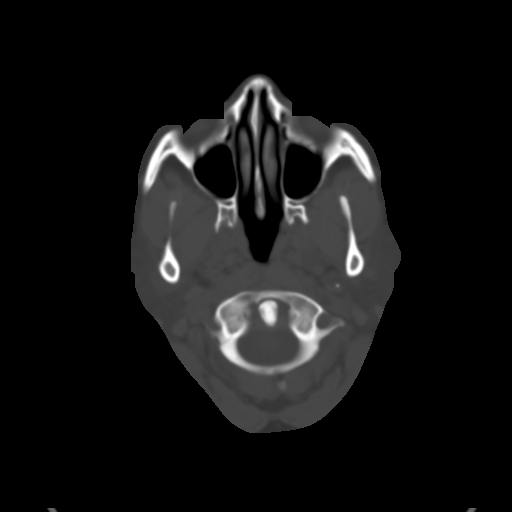
[im 6/34  brain]
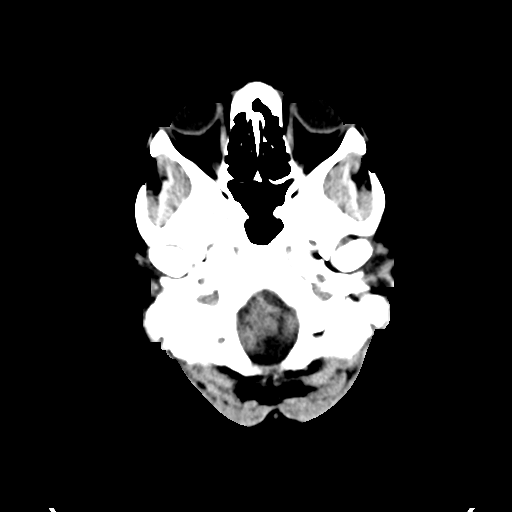
[im 10/34  brain]
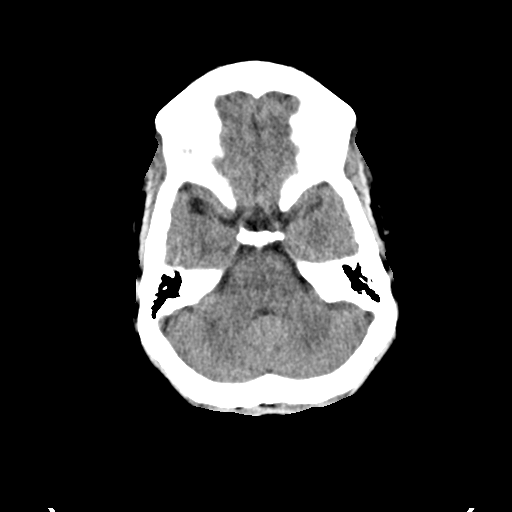
[im 13/34  brain]
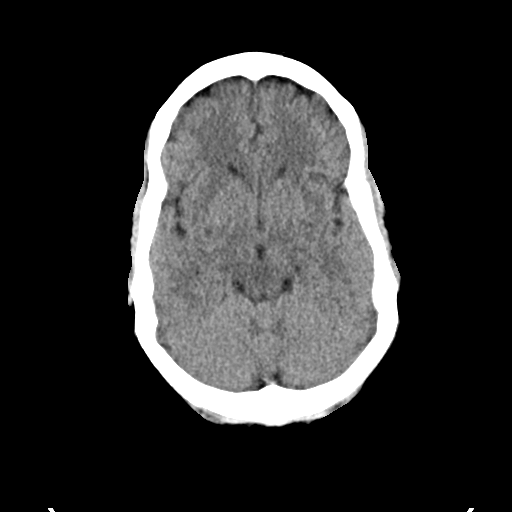
[im 18/34  brain]
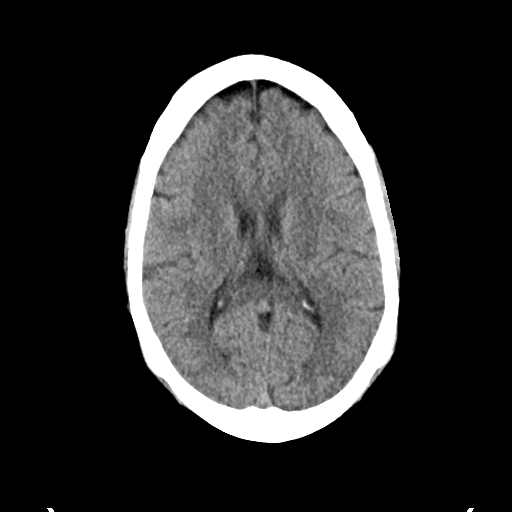
[im 18/34  bone]
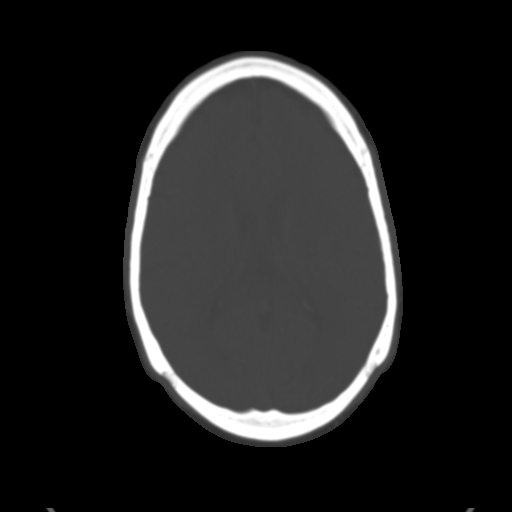
[im 21/34  brain]
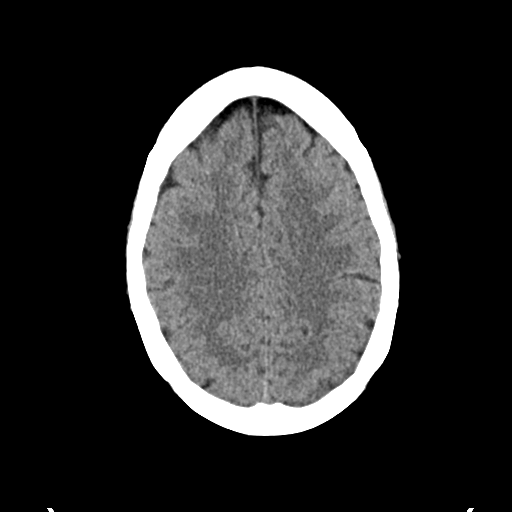
[im 24/34  brain]
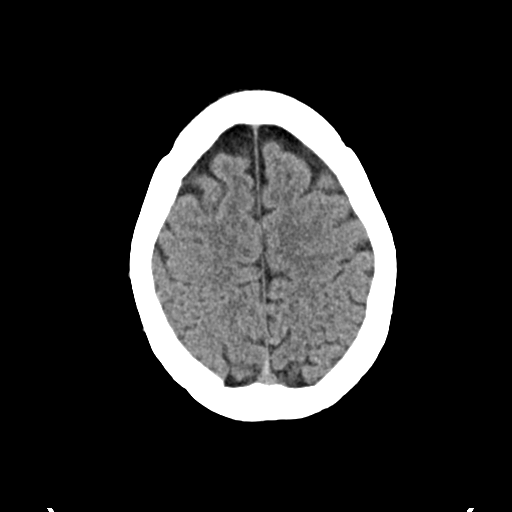
[im 28/34  brain]
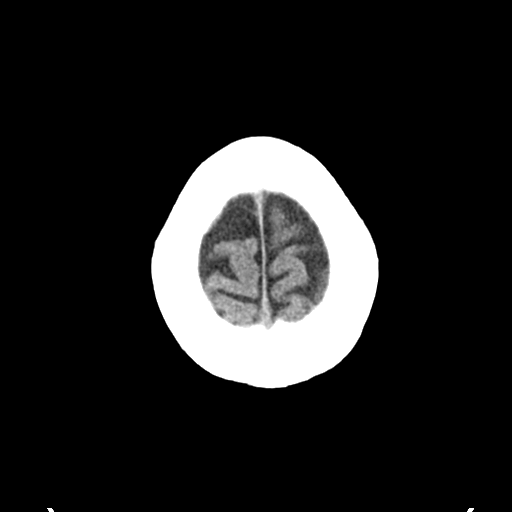
[im 31/34  brain]
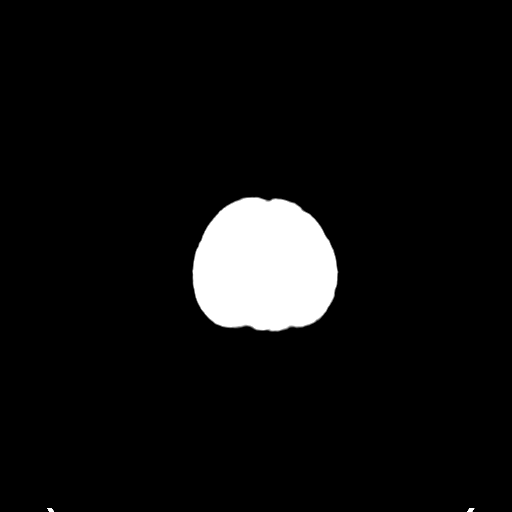
[im 31/34  bone]
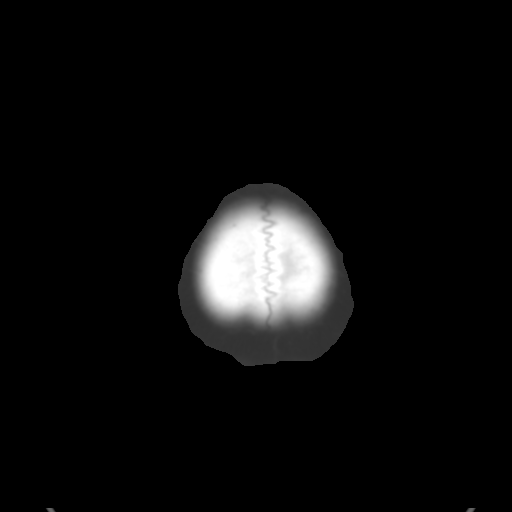

[Series 4: coronal soft · coronal · 0.33mm/px · 3 of 71 slices shown]
[im 24/71  brain]
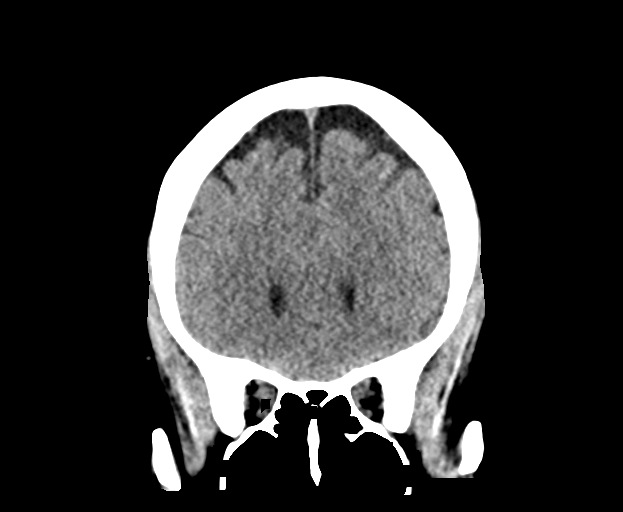
[im 32/71  brain]
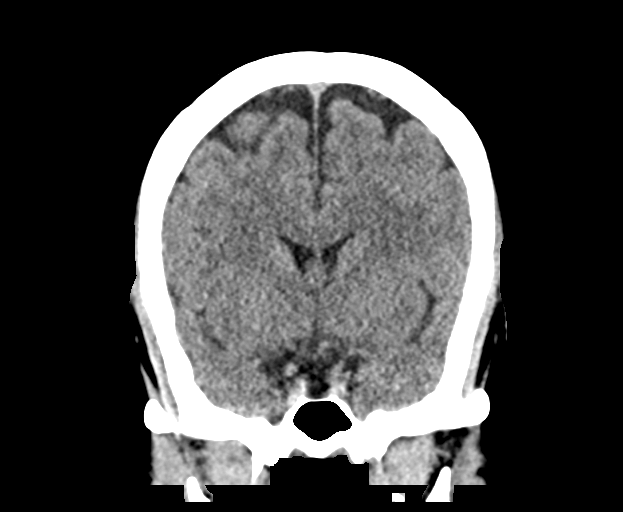
[im 39/71  brain]
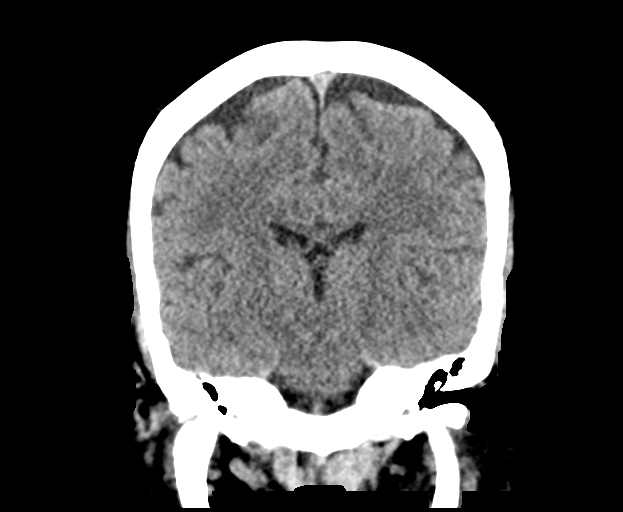

[Series 5: sag soft · sagittal · 0.33mm/px · 3 of 65 slices shown]
[im 22/65  brain]
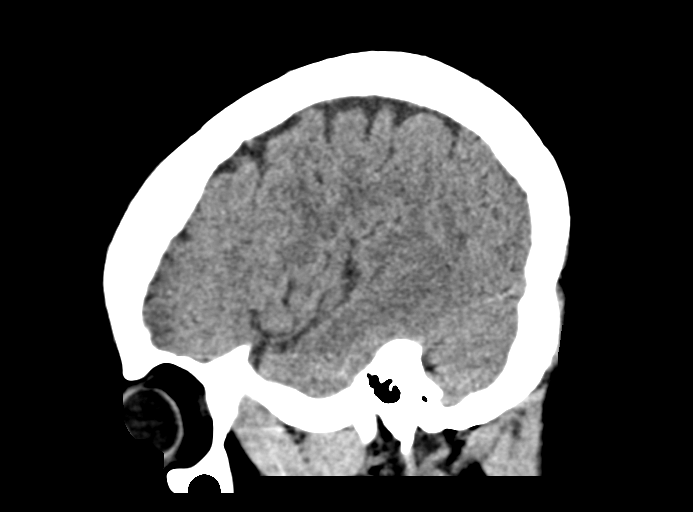
[im 33/65  brain]
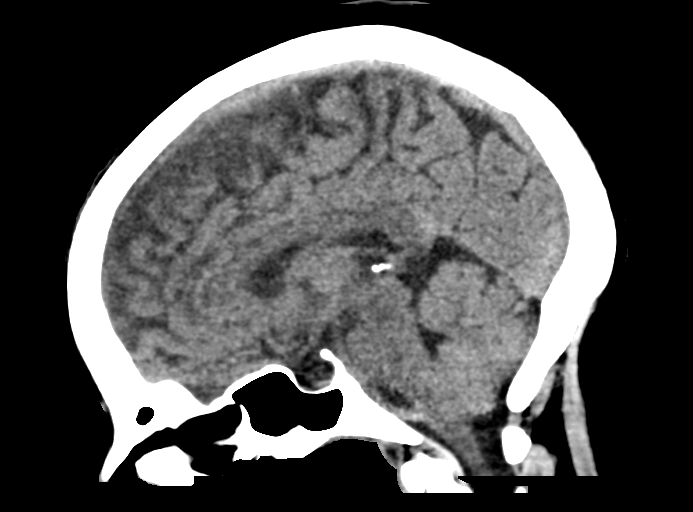
[im 43/65  brain]
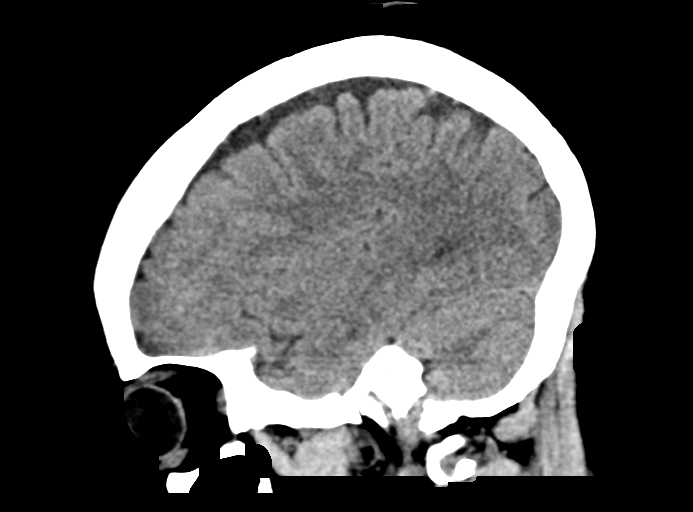

[15 of 47 positions shown; findings below may reference images not displayed]

FINDINGS: Brain: The ventricles are normal in size and configuration. No
extra-axial fluid collections are identified. The gray-white
differentiation is maintained. No CT findings for acute hemispheric
infarction or intracranial hemorrhage. No mass lesions. The
brainstem and cerebellum are normal.

Vascular: No hyperdense vessels or obvious aneurysm.

Skull: No acute skull fracture.  No bone lesion.

Sinuses/Orbits: The paranasal sinuses and mastoid air cells are
clear. The globes are intact.

Other: No scalp lesions, laceration or hematoma.
IMPRESSION: No acute intracranial findings or mass lesions.

## 2024-02-01 IMAGING — CR DG CHEST 2V
2 series · 2 of 2 positions shown · non-contrast
Comparison: Chest x-ray and chest CT 01/15/2020

CLINICAL DATA: Chest pain.

EXAM:
CHEST - 2 VIEW

[w chest pa]
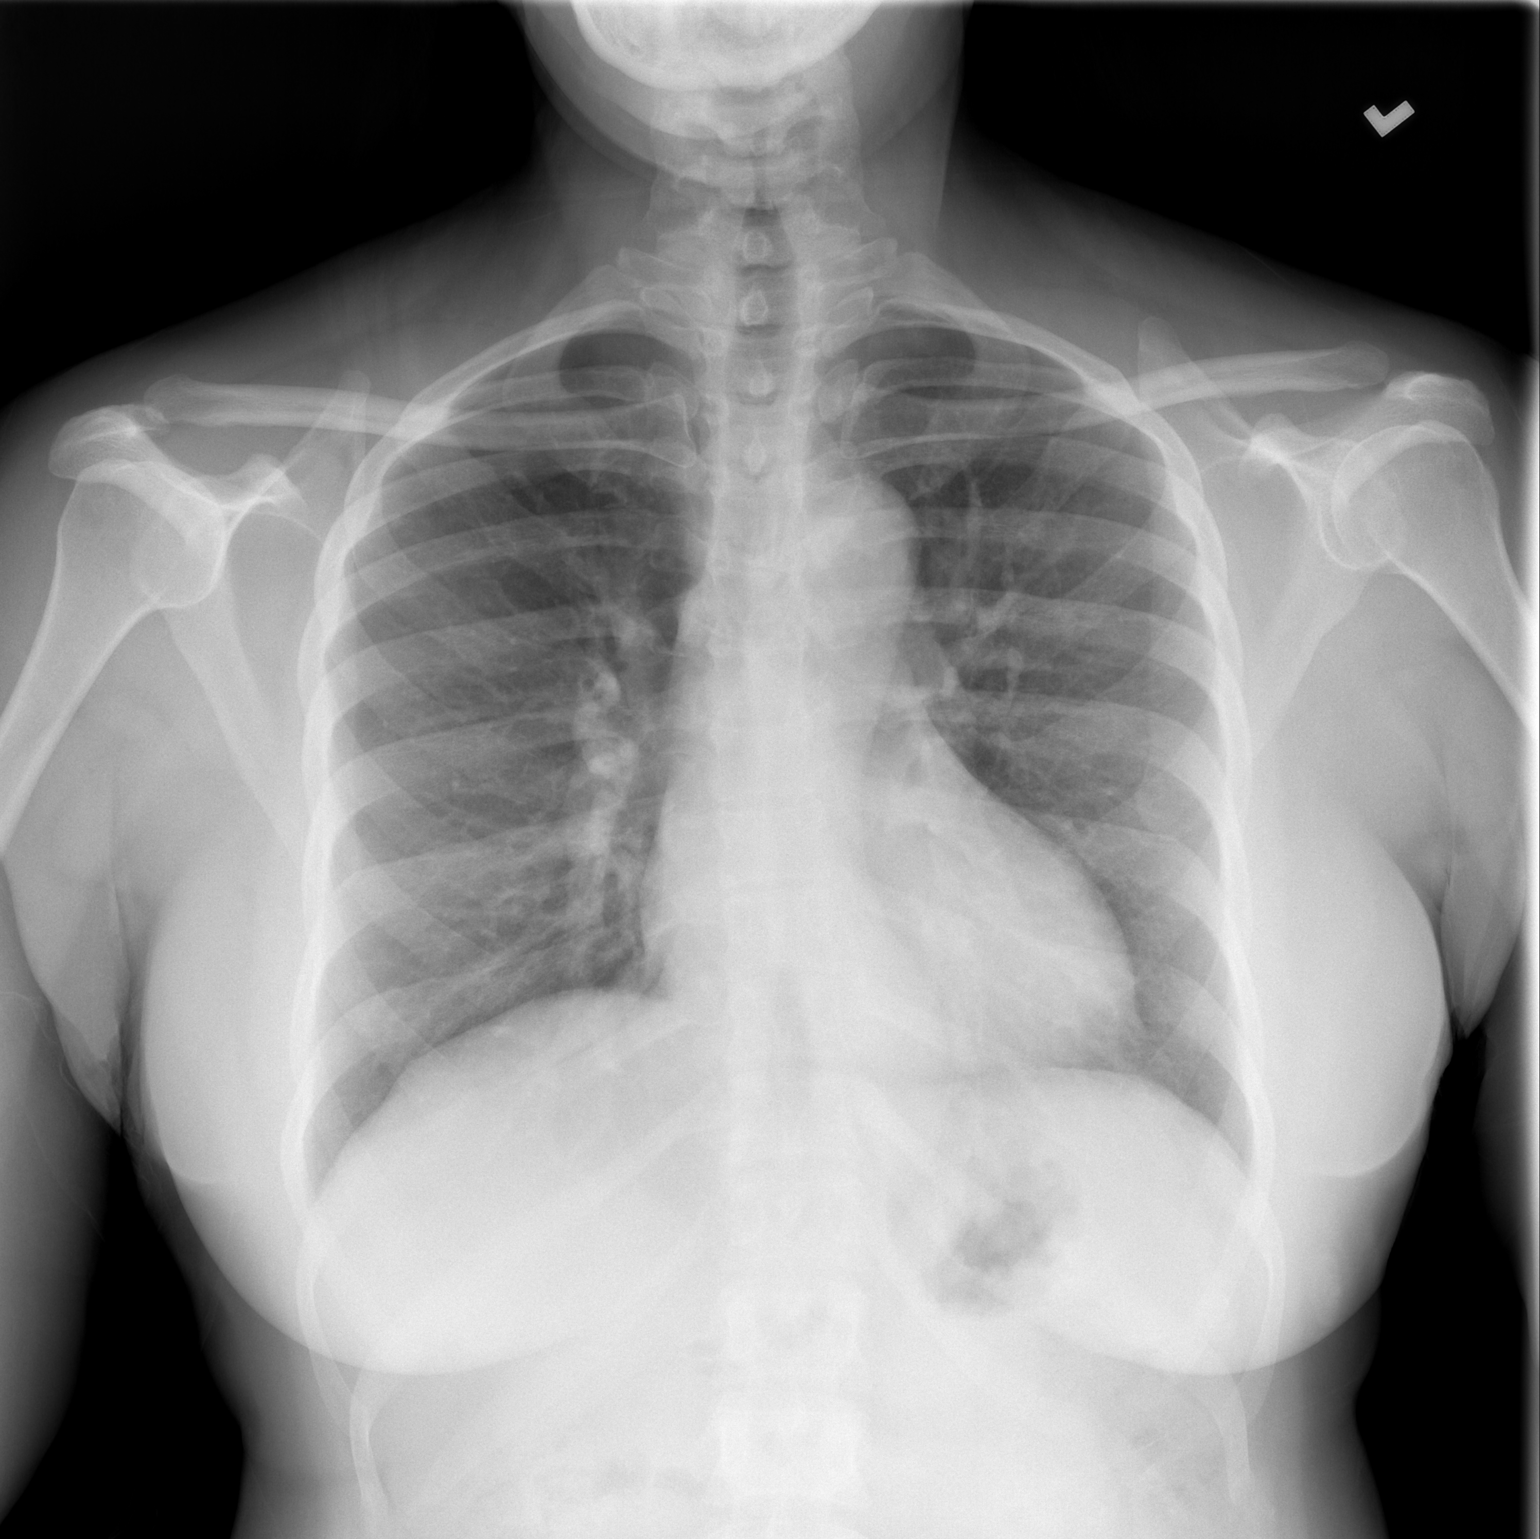

[w chest lat]
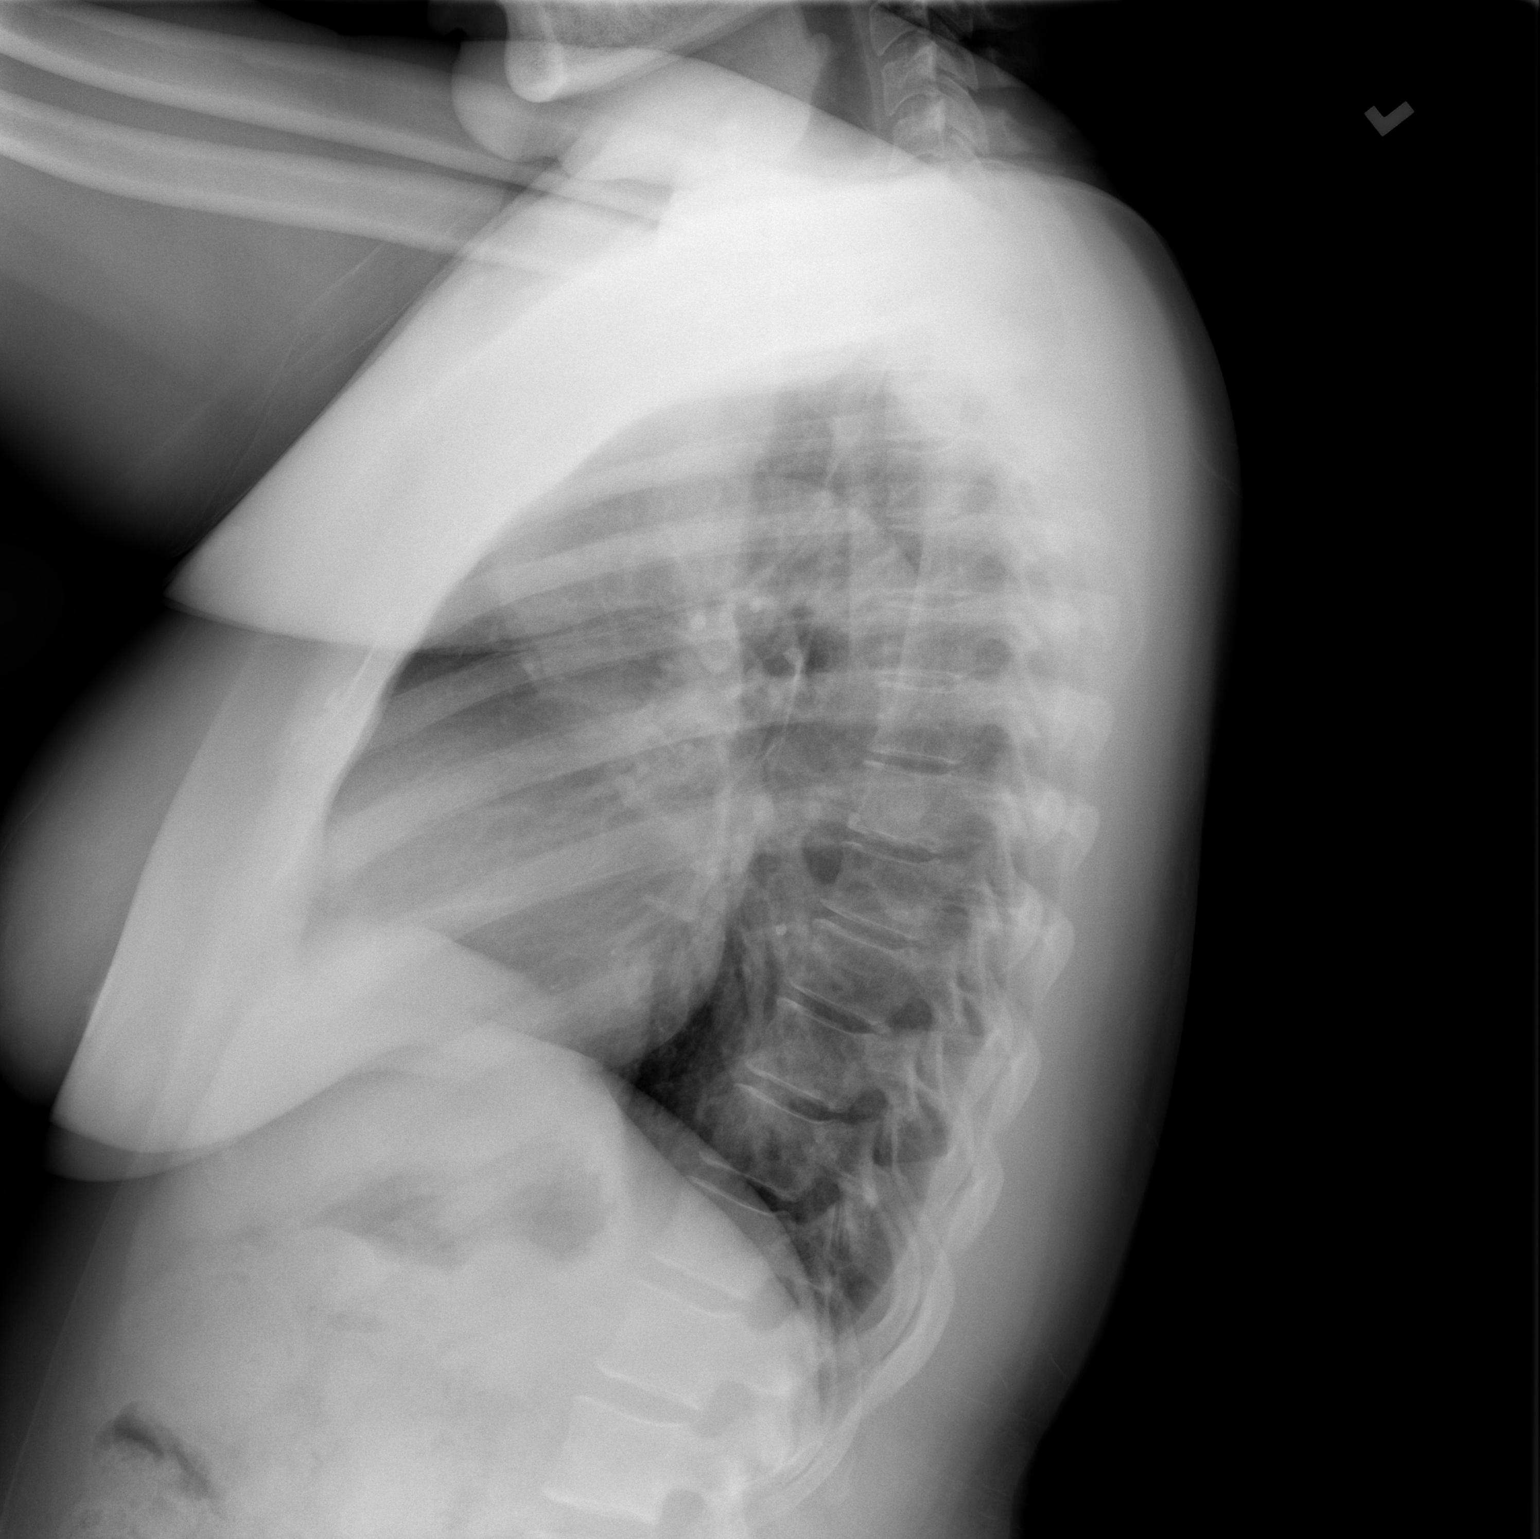

[2 of 2 positions shown; findings below may reference images not displayed]

FINDINGS: The cardiac silhouette, mediastinal and hilar contours are normal.
The lungs are clear. No pleural effusions. The bony thorax is
intact.
IMPRESSION: No acute cardiopulmonary findings.

## 2024-02-05 ENCOUNTER — Ambulatory Visit (INDEPENDENT_AMBULATORY_CARE_PROVIDER_SITE_OTHER): Admitting: Neurology

## 2024-02-05 ENCOUNTER — Encounter: Payer: Self-pay | Admitting: Neurology

## 2024-02-05 ENCOUNTER — Other Ambulatory Visit

## 2024-02-05 VITALS — BP 122/80 | HR 77 | Ht 62.0 in | Wt 177.0 lb

## 2024-02-05 DIAGNOSIS — G43109 Migraine with aura, not intractable, without status migrainosus: Secondary | ICD-10-CM | POA: Diagnosis not present

## 2024-02-05 DIAGNOSIS — R0683 Snoring: Secondary | ICD-10-CM

## 2024-02-05 DIAGNOSIS — Z72820 Sleep deprivation: Secondary | ICD-10-CM | POA: Diagnosis not present

## 2024-02-05 DIAGNOSIS — M542 Cervicalgia: Secondary | ICD-10-CM

## 2024-02-05 MED ORDER — SUMATRIPTAN SUCCINATE 100 MG PO TABS
100.0000 mg | ORAL_TABLET | Freq: Once | ORAL | 5 refills | Status: AC | PRN
Start: 2024-02-05 — End: ?

## 2024-02-05 MED ORDER — NORTRIPTYLINE HCL 10 MG PO CAPS: 20.0000 mg | ORAL_CAPSULE | Freq: Every day | ORAL | 5 refills | Status: DC

## 2024-02-05 MED ORDER — SUMATRIPTAN SUCCINATE 100 MG PO TABS: 100.0000 mg | ORAL_TABLET | Freq: Once | ORAL | 5 refills | Status: DC | PRN

## 2024-02-05 NOTE — Patient Instructions (Addendum)
 I saw you today for headaches and numbness on the left side.  I think your headaches sound like migraines. Your tight neck is likely also contributing. Your sleep may also be contributing as you may have sleep apnea.  I would like to do blood work today. I will be in touch when I have the results.  I am referring you to sleep medicine to evaluate your sleep.  I am referring you to physical therapy for your neck.  For your migraines: Migraine prevention:  Start nortriptyline  10 mg at bedtime for 1 week, then increase to 20 mg at bedtime thereafter Migraine rescue:  Sumatriptan  100 mg as needed at headache onset, can repeat in 2 hours if needed Limit use of pain relievers to no more than 2 days out of week to prevent risk of rebound or medication-overuse headache. Keep headache diary  I will see you back in clinic in 3 months to check your progress. Please let me know if you have any questions or concerns in the meantime.  The physicians and staff at [facility] are committed to providing excellent care. You may receive a survey requesting feedback about your experience at our office. We strive to receive very good responses to the survey questions. If you feel that your experience would prevent you from giving the office a very good  response, please contact our office to try to remedy the situation. We may be reached at [phone]. Thank you for taking the time out of your busy day to complete the survey.  Venetia Potters, MD [facility]  More migraine information: Be aware of common food triggers:  - Caffeine:  coffee, black tea, cola, Mt. Dew  - Chocolate  - Dairy:  aged cheeses (brie, blue, cheddar, gouda, Parmasan, provolone, romano, Swiss, etc), chocolate milk, buttermilk, sour cream, limit eggs and yogurt  - Nuts, peanut butter  - Alcohol  - Cereals/grains:  FRESH breads (fresh bagels, sourdough, doughnuts), yeast productions  - Processed/canned/aged/cured meats  (pre-packaged deli meats, hotdogs)  - MSG/glutamate:  soy sauce, flavor enhancer, pickled/preserved/marinated foods  - Sweeteners:  aspartame (Equal, Nutrasweet).  Sugar and Splenda are okay  - Vegetables:  legumes (lima beans, lentils, snow peas, fava beans, pinto peans, peas, garbanzo beans), sauerkraut, onions, olives, pickles  - Fruit:  avocados, bananas, citrus fruit (orange, lemon, grapefruit), mango  - Other:  Frozen meals, macaroni and cheese Routine exercise Stay adequately hydrated (aim for 64 oz water daily) Keep headache diary Maintain proper stress management Maintain proper sleep hygiene Do not skip meals Consider supplements:  magnesium  citrate 400mg  daily, riboflavin 400mg  daily, coenzyme Q10 100mg  three times daily.

## 2024-02-10 ENCOUNTER — Ambulatory Visit: Payer: Self-pay | Admitting: Neurology

## 2024-02-10 LAB — VITAMIN B12: Vitamin B-12: 746 pg/mL (ref 200–1100)

## 2024-02-10 LAB — TSH: TSH: 1.28 m[IU]/L

## 2024-02-10 LAB — VITAMIN B1: Vitamin B1 (Thiamine): <6 nmol/L — ABNORMAL LOW (ref 8–30)

## 2024-02-10 NOTE — Telephone Encounter (Signed)
 Called and left message to call office about lab results.

## 2024-02-10 NOTE — Telephone Encounter (Signed)
-----   Message from Tonita MARLA Blanch sent at 02/10/2024 12:28 PM EDT ----- Please let pt know that vitamin B1 is very low and to start OTC vitamin B1 100mg  daily. Thanks.  ----- Message ----- From: Interface, [facility] Lab Results In Sent: 02/06/2024  12:17 AM EDT To: Venetia LITTIE Potters, MD

## 2024-02-16 ENCOUNTER — Encounter (HOSPITAL_BASED_OUTPATIENT_CLINIC_OR_DEPARTMENT_OTHER): Payer: Self-pay

## 2024-02-16 ENCOUNTER — Ambulatory Visit (HOSPITAL_BASED_OUTPATIENT_CLINIC_OR_DEPARTMENT_OTHER)

## 2024-02-16 VITALS — BP 113/77 | HR 60 | Ht 62.0 in | Wt 175.0 lb

## 2024-02-16 DIAGNOSIS — G4719 Other hypersomnia: Secondary | ICD-10-CM | POA: Diagnosis not present

## 2024-02-16 NOTE — Progress Notes (Signed)
 @Patient  ID: Jennifer Mcgrath, female    DOB: 1976-02-02, 48 y.o.   MRN: [id number]  Chief Complaint  Patient presents with   Establish Care    New  sleep     Referring provider: [name], MD  HPI: Jennifer Mcgrath is a 48 year old female with past medical history of hypertension, postpartum pulmonary embolism, GERD, paroxysmal atrial fibrillation who presents today as a new patient evaluation for sleep.  She reports that she has been told by her family members that she snores quite a bit.  She also has morning headaches and excessive daytime fatigue.  She reports issues with paroxysmal nocturnal dyspnea as well.  She states that she goes to bed between 10 PM and midnight every night and wakes up around 7:45 in the morning.  She has had some issues of sleepiness while driving but reports that she does not frequently nap.  She has never had workup for sleep apnea in the past.  She denies chest pain, shortness of breath, fever, chills, night sweats, weight changes.  Epworth today noted to be 5.  TEST/EVENTS : Brain MRI 12/11/2023:IMPRESSION: 1. No acute intracranial abnormality. 2. No mass or abnormal enhancement.  Not on File  Immunization History  Administered Date(s) Administered   Tdap 04/18/2019    Past Medical History:  Diagnosis Date   Benign hematuria    Depression    GERD (gastroesophageal reflux disease)    Hypertension    Iron deficiency anemia    Microscopic hematuria    PE (pulmonary thromboembolism) (HCC)    Umbilical hernia     Tobacco History: Social History   Tobacco Use  Smoking Status Never  Smokeless Tobacco Never   Counseling given: Not Answered   Outpatient Medications Prior to Visit  Medication Sig Dispense Refill   albuterol  (VENTOLIN  HFA) 108 (90 Base) MCG/ACT inhaler Inhale 2 puffs into the lungs every 6 (six) hours as needed for wheezing or shortness of breath. 8 g 5   Ascorbic Acid (VITAMIN C) 1000 MG tablet Take 1,000 mg by mouth daily.      Ferrous Sulfate 140 (45 Fe) MG TBCR Take 1 tablet by mouth daily.     losartan  (COZAAR ) 50 MG tablet TAKE 1 TABLET(50 MG) BY MOUTH DAILY 90 tablet 1   magnesium  oxide (MAG-OX) 400 (240 Mg) MG tablet Take 1 tablet by mouth 2 (two) times daily.     meclizine  (ANTIVERT ) 25 MG tablet TAKE 1 TABLET(25 MG) BY MOUTH THREE TIMES DAILY AS NEEDED FOR DIZZINESS 30 tablet 0   NIFEdipine  (PROCARDIA  XL/NIFEDICAL XL) 60 MG 24 hr tablet TAKE 1 TABLET(60 MG) BY MOUTH DAILY 90 tablet 1   nitrofurantoin , macrocrystal-monohydrate, (MACROBID ) 100 MG capsule Take 1 capsule (100 mg total) by mouth 2 (two) times daily. (Patient not taking: Reported on 02/05/2024) 10 capsule 0   nortriptyline  (PAMELOR ) 10 MG capsule Take 2 capsules (20 mg total) by mouth at bedtime. Take 1 capsule (10 mg) at bedtime for 1 week, then increase to 2 capsules (20 mg) at bedtime thereafter 60 capsule 5   nystatin  (MYCOSTATIN /NYSTOP ) powder Apply 1 Application topically 2 (two) times daily. (Patient taking differently: Apply 1 Application topically as needed.) 30 g 1   Omega-3 Fatty Acids (FISH OIL) 1000 MG CAPS Take by mouth. (Patient taking differently: Take by mouth as needed.)     potassium chloride  SA (KLOR-CON  M) 20 MEQ tablet Take 1 tablet (20 mEq total) by mouth 2 (two) times daily. 180 tablet 1  spironolactone  (ALDACTONE ) 25 MG tablet Take 1 tablet (25 mg total) by mouth daily. 90 tablet 0   SUMAtriptan  (IMITREX ) 100 MG tablet Take 1 tablet (100 mg total) by mouth once as needed for up to 1 dose. May repeat in 2 hours if headache persists or recurs. 10 tablet 5   zinc gluconate 50 MG tablet Take 50 mg by mouth daily. (Patient taking differently: Take 50 mg by mouth as needed.)     No facility-administered medications prior to visit.     Review of Systems: As per HPI  Constitutional:   No  weight loss, night sweats,  Fevers, chills, fatigue, or  lassitude.  HEENT:   No headaches,  Difficulty swallowing,  Tooth/dental problems, or   Sore throat,                No sneezing, itching, ear ache, nasal congestion, post nasal drip,   CV:  No chest pain,  Orthopnea, PND, swelling in lower extremities, anasarca, dizziness, palpitations, syncope.   GI  No heartburn, indigestion, abdominal pain, nausea, vomiting, diarrhea, change in bowel habits, loss of appetite, bloody stools.   Resp: No shortness of breath with exertion or at rest.  No excess mucus, no productive cough,  No non-productive cough,  No coughing up of blood.  No change in color of mucus.  No wheezing.  No chest wall deformity  Skin: no rash or lesions.  GU: no dysuria, change in color of urine, no urgency or frequency.  No flank pain, no hematuria   MS:  No joint pain or swelling.  No decreased range of motion.  No back pain.    Physical Exam  BP 113/77   Pulse 60   Ht 5' 2 (1.575 m)   Wt 175 lb (79.4 kg)   SpO2 99%   BMI 32.01 kg/m   GEN: A/Ox3; pleasant , NAD, well nourished    HEENT:  [place]/AT,  EACs-clear, TMs-wnl, NOSE-clear, THROAT-clear, no lesions, no postnasal drip or exudate noted.  Mallampati 2  NECK:  Supple w/ fair ROM; no JVD; normal carotid impulses w/o bruits; no thyromegaly or nodules palpated; no lymphadenopathy.    RESP  Clear  P & A; w/o, wheezes/ rales/ or rhonchi. no accessory muscle use, no dullness to percussion  CARD:  RRR, no m/r/g, no peripheral edema, pulses intact, no cyanosis or clubbing.  GI:   Soft & nt; nml bowel sounds; no organomegaly or masses detected.   Musco: Warm bil, no deformities or joint swelling noted.   Neuro: alert, no focal deficits noted.    Skin: Warm, no lesions or rashes    Lab Results:  CBC    Component Value Date/Time   WBC 6.4 12/10/2023 2314   RBC 4.07 12/10/2023 2314   HGB 12.1 12/10/2023 2314   HGB 11.5 (L) 12/28/2020 1334   HCT 36.7 12/10/2023 2314   PLT 306 12/10/2023 2314   PLT 320 12/28/2020 1334   MCV 90.2 12/10/2023 2314   MCH 29.7 12/10/2023 2314   MCHC 33.0  12/10/2023 2314   RDW 13.2 12/10/2023 2314   LYMPHSABS 2.9 12/10/2023 2314   MONOABS 0.5 12/10/2023 2314   EOSABS 0.1 12/10/2023 2314   BASOSABS 0.0 12/10/2023 2314    BMET    Component Value Date/Time   NA 140 12/10/2023 2314   K 3.8 12/10/2023 2314   CL 102 12/10/2023 2314   CO2 26 12/10/2023 2314   GLUCOSE 86 12/10/2023 2314   BUN 17 12/10/2023  2314   CREATININE 0.91 12/10/2023 2314   CREATININE 0.89 10/23/2023 1538   CALCIUM 9.4 12/10/2023 2314   GFRNONAA >60 12/10/2023 2314   GFRNONAA >60 12/28/2020 1333   GFRAA >60 01/15/2020 1329    BNP    Component Value Date/Time   BNP 61.8 09/02/2016 1715    ProBNP No results found for: PROBNP  Imaging: No results found.  Administration History     None           No data to display          No results found for: NITRICOXIDE   Assessment & Plan:   Assessment & Plan Excessive daytime sleepiness - Concern is for sleep apnea given complaints of snoring, morning headaches, excessive daytime fatigue, and PND.  Plan for home sleep test.  Ordered today. -Sleep hygiene discussed today.;  Informational packets on sleep apnea and sleep study provided with AVS -Plan for follow-up in 4 to 6 weeks to review sleep study.   Return in about 6 weeks (around 03/29/2024) for sleep study review.  Candis Dandy, PA-C 02/16/2024

## 2024-02-16 NOTE — Patient Instructions (Signed)
 Complete sleep study as ordered.    Follow up in 4-6 weeks to discuss results.

## 2024-02-16 NOTE — Progress Notes (Signed)
 Epworth Sleepiness Scale  Use the following scale to choose the most appropriate number for each situation. 0 Would never nod off 1  Slight  chance of nodding off 2 Moderate chance of nodding off 3 High chance of nodding off  Sitting and reading: 0 Watching TV: 1 Sitting, inactive, in a public place (e.g., in a meeting, theater, or dinner event): 0 As a passenger in a car for an hour or more without stopping for a break: 1 Lying down to rest when circumstances permit:3 Sitting and talking to someone: 0 Sitting quietly after a meal without alcohol: 0 In a car, while stopped for a few  minutes in traffic or at a light: 0  TOTOAL: 5

## 2024-03-03 NOTE — Therapy (Incomplete)
 OUTPATIENT PHYSICAL THERAPY CERVICAL EVALUATION   Patient Name: Jennifer Mcgrath MRN: [id number] DOB:10-21-1975, 48 y.o., female Today's Date: 03/03/2024   END OF SESSION:   Past Medical History:  Diagnosis Date   Benign hematuria    Depression    GERD (gastroesophageal reflux disease)    Hypertension    Iron deficiency anemia    Microscopic hematuria    PE (pulmonary thromboembolism) (HCC)    Umbilical hernia    Past Surgical History:  Procedure Laterality Date   CESAREAN SECTION  1992, 1999, 2003, 2008, 2020   TUBAL LIGATION  2020   Patient Active Problem List   Diagnosis Date Noted   Perimenopause 10/23/2023   Eczema 10/23/2023   Asthma 02/10/2023   Intertrigo 11/21/2022   Benign paroxysmal positional vertigo due to bilateral vestibular disorder 11/21/2022   Vaginal discharge 11/21/2022   Encounter for general adult medical examination with abnormal findings 11/21/2022   Iron deficiency anemia 08/28/2022   Benign hematuria 08/28/2022   Anemia 08/15/2022   Microscopic hematuria 08/15/2022   Proteinuria 08/15/2022   Primary hypertension 08/15/2022   History of pulmonary embolus (PE) 08/15/2022   Hypertension 08/15/2022    PCP: Jennifer Venetia CROME, MD   REFERRING PROVIDER: [name], MD   REFERRING DIAG: M54.2 (ICD-10-CM) - Cervicalgia   THERAPY DIAG:  No diagnosis found.  RATIONALE FOR EVALUATION AND TREATMENT: Rehabilitation  ONSET DATE: ***10+ years ago  NEXT MD VISIT: 05/13/2024   SUBJECTIVE:                                                                                                                                                                                                         SUBJECTIVE STATEMENT: ***  Hand dominance: {MISC; OT HAND [phone]}  PAIN: Are you having pain? {OPRCPAIN:27236}  PERTINENT HISTORY:  ***HTN, PE (2020), GERD, anxiety, depression, BPPV, asthma  *** HPI: This is Jennifer Mcgrath, a 48 y.o.  right-handed female with a medical history of HTN, PE (2020), GERD who presents to neurology clinic with the chief complaint of headaches and numbness. The patient is alone today.   Patient's symptoms started at least 10 years ago. In 2020 when she had blood clots in her lungs she felt it got more noticeable. She gave birth and also had COVID around this time. She started having headaches and numbness in her left arm. She will have chest pain and shortness of breath as well. She feels like symptoms were sometimes associated with high blood pressure. In 12/10/23 she had a left sided headache with left sided  numbness and tingling in face, arm, and leg. She went to ED. Labs, CT head, and MRI brain w/wo contrast were normal. She has not had these diffuse numbness since.    She occasionally gets black spots in her vision. She is not sure if this is associated with her headaches.   The numbness and headaches do not always come together. She describes her headache has left sided tension, throbbing. She rates the pain as 4/10. She endorses neck pain with the headache. She endorses photophobia, phonophobia, and nausea with her headaches. She sometimes has dizziness like every she is on a boat rocking with her headaches. She may also blurry vision but no loss of vision. Her headache will last a few hours. She will take ibuprofen  or Goody's powder and sometimes meclizine  and will lay down. These do help. She takes meclizine  about twice a month. She will take OTC NSAID 2-3 times per week. She will have a headache at least 6-7 times per month.   Regarding her sleep, she sleeps about 8 hours per night. She endorses snoring. She does not feel well rested when she wakes. She feels tired throughout the day and will take naps if able.   Regarding mood, she endorses anxiety and depression. ***  PRECAUTIONS: {Therapy precautions:24002}  RED FLAGS: {PT Red Flags:29287}  HAND DOMINANCE: {Hand Dominance:29389}  WEIGHT  BEARING RESTRICTIONS: {Yes ***/No:24003}  FALLS:  Has patient fallen in last 6 months? {fallsyesno:27318}  LIVING ENVIRONMENT: Lives with: {OPRC lives with:25569::lives with their family} Lives in: {Lives in:25570} Stairs: {opstairs:27293} Has following equipment at home: {Assistive devices:23999}  OCCUPATION: ***  PLOF: {PLOF:24004}  PATIENT GOALS: ***   OBJECTIVE: (objective measures completed at initial evaluation unless otherwise dated)  DIAGNOSTIC FINDINGS:  ***12/11/23 - MRI BRAIN WITH AND WITHOUT CONTRAST CLINICAL HISTORY: Headache, neuro deficit.   FINDINGS:   BRAIN AND VENTRICLES: No acute infarct. No acute intracranial hemorrhage. No mass effect or midline shift. No hydrocephalus. The sella is unremarkable. Normal flow voids. No mass or abnormal enhancement.   ORBITS: No acute abnormality.   SINUSES: No acute abnormality.   BONES AND SOFT TISSUES: Normal bone marrow signal and enhancement. No acute soft tissue abnormality.   CONTRAST: 8mL gadobutrol  (GADAVIST ) 1 MMOL/ML injection 8 mL GADOBUTROL  1 MMOL/ML IV SOLN   IMPRESSION: 1. No acute intracranial abnormality. 2. No mass or abnormal enhancement.  12/10/23 - CT HEAD WITHOUT CONTRAST CLINICAL DATA: Headache   FINDINGS: Brain: No evidence of acute infarction, hemorrhage, hydrocephalus, extra-axial collection or mass lesion/mass effect.   Vascular: No hyperdense vessel or unexpected calcification.   Skull: Normal. Negative for fracture or focal lesion.   Sinuses/Orbits: No acute finding.   Other: None   IMPRESSION: Negative non contrasted CT appearance of the brain.  PATIENT SURVEYS:  {rehab surveys:24030}  COGNITION: Overall cognitive status: {cognition:24006}  SENSATION: {sensation:27233}  POSTURE:  {posture:25561}  PALPATION: ***   CERVICAL ROM:   {AROM/PROM:27142} ROM A/PROM  eval  Flexion   Extension   Right lateral flexion   Left lateral flexion   Right rotation    Left rotation    (Blank rows = not tested)  UPPER EXTREMITY ROM:  {AROM/PROM:27142} ROM Right eval Left eval  Shoulder flexion    Shoulder extension    Shoulder abduction    Shoulder adduction    Shoulder internal rotation    Shoulder external rotation    Elbow flexion    Elbow extension    Wrist flexion    Wrist extension  Wrist ulnar deviation    Wrist radial deviation    Wrist pronation    Wrist supination     (Blank rows = not tested)  UPPER EXTREMITY MMT:  MMT Right eval Left eval  Shoulder flexion    Shoulder extension    Shoulder abduction    Shoulder adduction    Shoulder internal rotation    Shoulder external rotation    Middle trapezius    Lower trapezius    Elbow flexion    Elbow extension    Wrist flexion    Wrist extension    Wrist ulnar deviation    Wrist radial deviation    Wrist pronation    Wrist supination    Grip strength     (Blank rows = not tested)  CERVICAL SPECIAL TESTS:  {Cervical special tests:25246}  FUNCTIONAL TESTS:  {Functional tests:24029}   TODAY'S TREATMENT:   ***   PATIENT EDUCATION:  Education details: {Education details:27468}  Person educated: {Person educated:25204} Education method: {Education Method EU:67125} Education comprehension: {Education Comprehension:25206}  HOME EXERCISE PROGRAM: ***  ASSESSMENT:  CLINICAL IMPRESSION: Jennifer Mcgrath is a 48 y.o. female who was referred to physical therapy for evaluation and treatment for ***.  ***   Patient reports onset of *** pain beginning ***. Pain is worse with ***.  Patient has deficits in *** ROM, *** flexibility, *** strength, ***abnormal posture, and TTP with abnormal muscle tension *** which are interfering with ADLs and are impacting quality of life.  On NDI patient scored ***/50 demonstrating ***% or *** disability.  Javanna will benefit from skilled PT to address above deficits to improve mobility and activity tolerance with decreased pain  interference.  OBJECTIVE IMPAIRMENTS: {opptimpairments:25111}.   ACTIVITY LIMITATIONS: {activitylimitations:27494}  PARTICIPATION LIMITATIONS: {participationrestrictions:25113}  PERSONAL FACTORS: {Personal factors:25162} are also affecting patient's functional outcome.   REHAB POTENTIAL: {rehabpotential:25112}  CLINICAL DECISION MAKING: {clinical decision making:25114}  EVALUATION COMPLEXITY: {Evaluation complexity:25115}   GOALS: Goals reviewed with patient? {yes/no:20286}  SHORT TERM GOALS: Target date: ***  Patient will be independent with initial HEP to improve outcomes and carryover.  Baseline: *** Goal status: {GOALSTATUS:25110}  2.  Patient will report 25% improvement in neck pain to improve QOL.  Baseline: *** Goal status: {GOALSTATUS:25110}  3.  *** Baseline: *** Goal status: {GOALSTATUS:25110}  LONG TERM GOALS: Target date: ***  Patient will be independent with ongoing/advanced HEP for self-management at home.  Baseline: *** Goal status: {GOALSTATUS:25110}  2.  Patient will demonstrate improved posture to decrease muscle imbalance. Baseline: *** Goal status: {GOALSTATUS:25110}  3.  Patient will report 50-75% improvement in neck pain to improve QOL.  Baseline: *** Goal status: {GOALSTATUS:25110}  4.  Patient to report 50-75% reduction in frequency and intensity of weekly headaches/migraines.   Baseline: *** Goal status: {GOALSTATUS:25110}   5.  Patient will demonstrate full pain free cervical ROM for safety with driving.  Baseline: Refer to above cervical ROM table Goal status: {GOALSTATUS:25110}  6.  Patient will report </= ***% on NDI (MCID = 15%) to demonstrate improved functional ability.  Baseline: *** Goal status: {GOALSTATUS:25110}  7.  Patient will ***   Baseline: *** Goal status: {GOALSTATUS:25110}   8. *** Baseline: *** Goal status: {GOALSTATUS:25110}   PLAN:  PT FREQUENCY: {rehab frequency:25116}  PT DURATION: {rehab  duration:25117}  PLANNED INTERVENTIONS: {rehab planned interventions:25118::97110-Therapeutic exercises,97530- Therapeutic [phone]- Neuromuscular re-education,97535- Self Rjmz,02859- Manual therapy,Patient/Family education}  PLAN FOR NEXT SESSION: ***   Elijah CHRISTELLA Hidden, PT 03/03/2024, 6:44 PM

## 2024-03-08 ENCOUNTER — Ambulatory Visit: Attending: Neurology | Admitting: Rehabilitation

## 2024-03-08 ENCOUNTER — Encounter: Payer: Self-pay | Admitting: Rehabilitation

## 2024-03-08 ENCOUNTER — Other Ambulatory Visit: Payer: Self-pay

## 2024-03-08 DIAGNOSIS — M542 Cervicalgia: Secondary | ICD-10-CM | POA: Insufficient documentation

## 2024-03-08 DIAGNOSIS — M6281 Muscle weakness (generalized): Secondary | ICD-10-CM | POA: Diagnosis present

## 2024-03-08 DIAGNOSIS — R293 Abnormal posture: Secondary | ICD-10-CM | POA: Insufficient documentation

## 2024-03-08 NOTE — Therapy (Signed)
 OUTPATIENT PHYSICAL THERAPY CERVICAL EVALUATION   Patient Name: Jennifer Mcgrath MRN: [id number] DOB:1976/03/16, 48 y.o., female Today's Date: 03/08/2024   END OF SESSION:  PT End of Session - 03/08/24 1620     Visit Number 1    Date for Recertification  05/03/24    PT Start Time 1618    PT Stop Time 1700    PT Time Calculation (min) 42 min          Past Medical History:  Diagnosis Date   Benign hematuria    Depression    GERD (gastroesophageal reflux disease)    Hypertension    Iron deficiency anemia    Microscopic hematuria    PE (pulmonary thromboembolism) (HCC)    Umbilical hernia    Past Surgical History:  Procedure Laterality Date   CESAREAN SECTION  1992, 1999, 2003, 2008, 2020   TUBAL LIGATION  2020   Patient Active Problem List   Diagnosis Date Noted   Perimenopause 10/23/2023   Eczema 10/23/2023   Asthma 02/10/2023   Intertrigo 11/21/2022   Benign paroxysmal positional vertigo due to bilateral vestibular disorder 11/21/2022   Vaginal discharge 11/21/2022   Encounter for general adult medical examination with abnormal findings 11/21/2022   Iron deficiency anemia 08/28/2022   Benign hematuria 08/28/2022   Anemia 08/15/2022   Microscopic hematuria 08/15/2022   Proteinuria 08/15/2022   Primary hypertension 08/15/2022   History of pulmonary embolus (PE) 08/15/2022   Hypertension 08/15/2022    PCP: [name], MD   REFERRING PROVIDER: [name], MD   REFERRING DIAG: M54.2 (ICD-10-CM) - Cervicalgia   THERAPY DIAG:  Cervicalgia  Abnormal posture  Muscle weakness (generalized)  RATIONALE FOR EVALUATION AND TREATMENT: Rehabilitation  ONSET DATE: 10+ years ago  NEXT MD VISIT: 05/13/2024   SUBJECTIVE:                                                                                                                                                                                                         SUBJECTIVE STATEMENT: States has had  neck pain, headaches, vertigo for almost 2 years.   She came here for vertigo in past, but was unable to keep coming in due to deaths in the family.   Her neck pain, vertigo, HA have persisted.  She reports the pain is intermittent and is over the central cervical spine, B upper trap area, and down her back along the scapular border.  She c/o only  Intermittent tingling into arms/hands.  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  She also has a toddler at home that she cares for.    Hand dominance: Right  PAIN: Are you having pain? Yes: NPRS scale: 2-3/10 now;  7/10 worst Pain location: B neck and UT Pain description: aching Aggravating factors: bending over to lift Relieving factors: lying on back, heat, ibuprofen , massage, pillow under neck  PERTINENT HISTORY:  HTN, PE (2020), GERD, anxiety, depression, BPPV, asthma   HPI: This is Ms. Jennifer Mcgrath, a 48 y.o. right-handed female with a medical history of HTN, PE (2020), GERD who presents to neurology clinic with the chief complaint of headaches and numbness. The patient is alone today.   Patient's symptoms started at least 10 years ago. In 2020 when she had blood clots in her lungs she felt it got more noticeable. She gave birth and also had COVID around this time. She started having headaches and numbness in her left arm. She will have chest pain and shortness of breath as well. She feels like symptoms were sometimes associated with high blood pressure. In 12/10/23 she had a left sided headache with left sided numbness and tingling in face, arm, and leg. She went to ED. Labs, CT head, and MRI brain w/wo contrast were normal. She has not had these diffuse numbness since.    She occasionally gets black spots in her vision. She is not sure if this is associated with her headaches.   The numbness and headaches do not always come together. She describes her headache has left sided tension, throbbing. She rates the pain as 4/10. She endorses neck pain  with the headache. She endorses photophobia, phonophobia, and nausea with her headaches. She sometimes has dizziness like every she is on a boat rocking with her headaches. She may also blurry vision but no loss of vision. Her headache will last a few hours. She will take ibuprofen  or Goody's powder and sometimes meclizine  and will lay down. These do help. She takes meclizine  about twice a month. She will take OTC NSAID 2-3 times per week. She will have a headache at least 6-7 times per month.   Regarding her sleep, she sleeps about 8 hours per night. She endorses snoring. She does not feel well rested when she wakes. She feels tired throughout the day and will take naps if able.   Regarding mood, she endorses anxiety and depression.   PRECAUTIONS: None  RED FLAGS: None  HAND DOMINANCE: Right  WEIGHT BEARING RESTRICTIONS: No  FALLS:  Has patient fallen in last 6 months? 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   FINDINGS:   BRAIN AND VENTRICLES: No acute infarct. No acute intracranial hemorrhage. No mass effect or midline shift. No hydrocephalus. The sella is unremarkable. Normal flow voids. No mass or abnormal enhancement.   ORBITS: No acute abnormality.   SINUSES: No acute abnormality.   BONES AND SOFT TISSUES: Normal bone marrow signal and enhancement. No acute soft tissue abnormality.   CONTRAST: 8mL gadobutrol  (GADAVIST ) 1 MMOL/ML injection 8 mL GADOBUTROL  1 MMOL/ML IV SOLN   IMPRESSION: 1. No acute  intracranial abnormality. 2. No mass or  abnormal enhancement.  12/10/23 - CT HEAD WITHOUT CONTRAST CLINICAL DATA: Headache   FINDINGS: Brain: No evidence of acute infarction, hemorrhage, hydrocephalus, extra-axial collection or mass lesion/mass effect.   Vascular: No hyperdense vessel or unexpected calcification.   Skull: Normal. Negative for fracture or focal lesion.   Sinuses/Orbits: No acute finding.   Other: None   IMPRESSION: Negative non contrasted CT appearance of the brain.  PATIENT SURVEYS:  NDI:  NECK DISABILITY INDEX  Date: 03/08/2024 Score  Pain intensity 0 = I have no pain at the moment  2. Personal care (washing, dressing, etc.) 0 = I can look after myself normally without causing extra pain  3. Lifting 1 =  I can lift heavy weights but it gives extra pain  4. Reading 1 = I can read as much as I want to with slight pain in my neck  5. Headaches 1 =  I have slight headaches, which come infrequently  6. Concentration 0 =  I can concentrate fully when I want to with no difficulty  7. Work 1 =  I can only do my usual work, but no more  8. Driving 2 =  I can drive my car as long as I want with moderate pain in my neck  9. Sleeping 0 = I have no trouble sleeping  10. Recreation 1 =  I am able to engage in all my recreation activities, with some pain in my neck  Total 7/50   Minimum Detectable Change (90% confidence): 5 points or 10% points  COGNITION: Overall cognitive status: Within functional limits for tasks assessed  SENSATION: WFL  POSTURE:  rounded shoulders, forward head, and increased thoracic kyphosis  has upper thoracic kyphosis  PALPATION: Very TTP w/ trigger points in B UT, levator, middle traps and rhomboids Cervical P/A glides are somewhat restricted Very TTP over C6-T2 spinous processes    CERVICAL ROM:   Active ROM A/PROM  eval  Flexion 80%  Extension 50%  Right lateral flexion 60  Left lateral flexion 60  Right rotation 90  Left  rotation 80   (Blank rows = not tested)  UPPER EXTREMITY ROM:  Active ROM Right eval Left eval  Shoulder flexion 180 180  Shoulder extension    Shoulder abduction 180 180  Shoulder adduction    Shoulder internal rotation T6 T8  Shoulder external rotation T2/90 deg T2/90 deg  Elbow flexion    Elbow extension    Wrist flexion    Wrist extension    Wrist ulnar deviation    Wrist radial deviation    Wrist pronation    Wrist supination     (Blank rows = not tested)  UPPER EXTREMITY MMT:  MMT Right eval Left eval  Shoulder flexion 4+ 4+  Shoulder extension    Shoulder abduction 4- 4  Shoulder adduction    Shoulder internal rotation 5 5  Shoulder external rotation 4- 4-  Middle trapezius 3+ 4-  Lower trapezius NT NT  Elbow flexion    Elbow extension    Wrist flexion    Wrist extension    Wrist ulnar deviation    Wrist radial deviation    Wrist pronation    Wrist supination    Grip strength     (Blank rows = not tested)  CERVICAL SPECIAL TESTS:  Upper limb tension test (ULTT): Negative, Spurling's test: Positive, and Distraction test: Positive compression is positive for increased neck pain but no radiation.   Distraction relieves neck pain  FUNCTIONAL TESTS:  Na   TODAY'S TREATMENT:  03/08/24 SELF CARE: Provided education on PT POC progression.  MODALITIES:  HP/premod electrical stim x 15' to B Cspine/UT area   PATIENT EDUCATION:  Education details: PT eval findings, anticipated POC, and initial HEP  Person educated: Patient Education method: Explanation, Demonstration, Verbal cues, Tactile cues, Handouts, and MedBridgeGO app access provided Education comprehension: verbalized understanding, verbal cues required, tactile cues required, and needs further education  HOME EXERCISE PROGRAM: Access Code: [email or web address] URL: [email or web address] Date: 03/08/2024 Prepared by: Garnette Montclair  Exercises - Seated Cervical Retraction  - 1 x daily - 7 x  weekly - 1 sets - 10 reps - 3 sec hold - Shoulder External Rotation and Scapular Retraction with Resistance  - 1 x daily - 7 x weekly - 3 sets - 10 reps - Standing Scapular Depression  - 1 x daily - 7 x weekly - 3 sets - 10 reps - Seated Thoracic Self Mobilization  - 1 x daily - 7 x weekly - 3 sets - 10 reps  ASSESSMENT:  CLINICAL IMPRESSION: Jennifer Mcgrath is a 48 y.o. female who was referred to physical therapy for evaluation and treatment for neck pain.   Patient reports onset of neck pain beginning about 2 years ago for no apparent reason, but has only continued to worsen.  She denies any trauma, MVA, hitting head or    She also has vertigo and headaches and feels that all 3 of these problems are related.   She did some therapy here for vertigo last year, but wasn't able to continue due to some deaths in her family.   She mainly wants to focus on neck pain with this episode of care. Pain is worse with bending forward to lift things.   She works at a desk all day and gets pain with this as well.    She has considerable postural muscle weakness and poor posture.  Patient has deficits in cervical ROM, cervical flexibility, BUE strength, abnormal posture, and TTP with abnormal muscle tension  which are interfering with ADLs and are impacting quality of life.  On NDI patient scored 7/50 demonstrating mild functional limitation/ disability.  Jennifer Mcgrath will benefit from skilled PT to address above deficits to improve mobility and activity tolerance with decreased pain interference.  OBJECTIVE IMPAIRMENTS: decreased ROM, decreased strength, impaired UE functional use, postural dysfunction, and pain.   ACTIVITY LIMITATIONS: carrying, lifting, bending, and reach over head  PARTICIPATION LIMITATIONS: cleaning, laundry, driving, and occupation  PERSONAL FACTORS: Age, Fitness, Time since onset of injury/illness/exacerbation, and 1-2 comorbidities: HTN, PE (2020), GERD, anxiety, depression, BPPV, asthma are also  affecting patient's functional outcome.   REHAB POTENTIAL: Good  CLINICAL DECISION MAKING: Evolving/moderate complexity  EVALUATION COMPLEXITY: Moderate   GOALS: Goals reviewed with patient? Yes  SHORT TERM GOALS: Target date: 04/05/2024   Patient will be independent with initial HEP to improve outcomes and carryover.  Baseline: 100% PT assist required for correct completion Goal status: INITIAL  2.  Patient will report 25% improvement in neck pain to improve QOL.  Baseline: 7/10 worst Goal status: INITIAL  3.  Patient will demo 75% cervical ROM all planes Baseline: see ROM tables above Goal status: INITIAL  LONG TERM GOALS: Target date: 05/03/2024   Patient will be independent with ongoing/advanced HEP for self-management at home.  Baseline: no advanced HEP yet Goal status: INITIAL  2.  Patient will demonstrate improved posture to decrease muscle imbalance. Baseline: forward head/thoracic kyphosis Goal status:  INITIAL  3.  Patient will report 50-75% improvement in neck pain to improve QOL.  Baseline: 7/10 worst Goal status: INITIAL  4.  Patient to report 50-75% reduction in frequency and intensity of weekly headaches/migraines.   Baseline:  Goal status: INITIAL   5.  Patient will demonstrate full pain free cervical ROM for safety with driving.  Baseline: Refer to above cervical ROM table Goal status: INITIAL  6.  Patient will report </= 0/50 on NDI (MCID = 15%) to demonstrate improved functional ability.  Baseline: 7/50 Goal status: INITIAL  PLAN:  PT FREQUENCY: 1-2x/week  PT DURATION: 8 weeks  PLANNED INTERVENTIONS: 97164- PT Re-evaluation, 97110-Therapeutic exercises, 97530- Therapeutic activity, [id number]- Neuromuscular re-education, 97535- Self Care, [id number]- Manual therapy, [phone]- Canalith repositioning, [id number]- Electrical stimulation (unattended), 97016- Vasopneumatic device, [id number]- Ultrasound, [id number]- Traction (mechanical), [id number]- Ionotophoresis 4mg /ml  Dexamethasone, Patient/Family education, Taping, Joint mobilization, Spinal mobilization, Cryotherapy, and Moist heat  PLAN FOR NEXT SESSION: foam roll stretching/strengtrhening/mobs; try TENS if she wants if estim helped last visit; postural reeducation and strengthening   Soloman Mckeithan, PT 03/08/2024, 5:17 PM

## 2024-03-09 ENCOUNTER — Encounter (HOSPITAL_BASED_OUTPATIENT_CLINIC_OR_DEPARTMENT_OTHER)

## 2024-03-21 ENCOUNTER — Other Ambulatory Visit: Payer: Self-pay | Admitting: Family

## 2024-03-22 ENCOUNTER — Ambulatory Visit: Admitting: Rehabilitation

## 2024-03-22 ENCOUNTER — Telehealth: Payer: Self-pay | Admitting: Rehabilitation

## 2024-03-22 NOTE — Telephone Encounter (Signed)
 Patient was no show for today's scheduled appointment at 1315.  Spoke with patient by phone and she reports she was at the hospital yesterday with her spouse for cardiac arrest and couldn't come today.   States she wants to keep her appointment this Thursday at 245pm.   Will plan to see her then.

## 2024-03-23 ENCOUNTER — Ambulatory Visit (HOSPITAL_BASED_OUTPATIENT_CLINIC_OR_DEPARTMENT_OTHER)

## 2024-03-23 DIAGNOSIS — G4719 Other hypersomnia: Secondary | ICD-10-CM

## 2024-03-24 ENCOUNTER — Ambulatory Visit: Admitting: Rehabilitation

## 2024-03-24 ENCOUNTER — Encounter: Payer: Self-pay | Admitting: Rehabilitation

## 2024-03-24 DIAGNOSIS — M542 Cervicalgia: Secondary | ICD-10-CM | POA: Diagnosis not present

## 2024-03-24 DIAGNOSIS — R293 Abnormal posture: Secondary | ICD-10-CM

## 2024-03-24 DIAGNOSIS — M6281 Muscle weakness (generalized): Secondary | ICD-10-CM

## 2024-03-24 DIAGNOSIS — G4733 Obstructive sleep apnea (adult) (pediatric): Secondary | ICD-10-CM | POA: Diagnosis not present

## 2024-03-24 NOTE — Therapy (Addendum)
 " OUTPATIENT PHYSICAL THERAPY CERVICAL TREATMENT / DC SUMMARY   Patient Name: Jennifer Mcgrath MRN: [id number] DOB:07/28/1975, 48 y.o., female Today's Date: 03/24/2024   END OF SESSION:  PT End of Session - 03/24/24 1502     Visit Number 2    Date for Recertification  05/03/24    PT Start Time 1500    PT Stop Time 1553    PT Time Calculation (min) 53 min    Activity Tolerance Patient tolerated treatment well;Patient limited by fatigue    Behavior During Therapy [facility] for tasks assessed/performed          Past Medical History:  Diagnosis Date   Benign hematuria    Depression    GERD (gastroesophageal reflux disease)    Hypertension    Iron deficiency anemia    Microscopic hematuria    PE (pulmonary thromboembolism) (HCC)    Umbilical hernia    Past Surgical History:  Procedure Laterality Date   CESAREAN SECTION  1992, 1999, 2003, 2008, 2020   TUBAL LIGATION  2020   Patient Active Problem List   Diagnosis Date Noted   Perimenopause 10/23/2023   Eczema 10/23/2023   Asthma 02/10/2023   Intertrigo 11/21/2022   Benign paroxysmal positional vertigo due to bilateral vestibular disorder 11/21/2022   Vaginal discharge 11/21/2022   Encounter for general adult medical examination with abnormal findings 11/21/2022   Iron deficiency anemia 08/28/2022   Benign hematuria 08/28/2022   Anemia 08/15/2022   Microscopic hematuria 08/15/2022   Proteinuria 08/15/2022   Primary hypertension 08/15/2022   History of pulmonary embolus (PE) 08/15/2022   Hypertension 08/15/2022    PCP: [name], MD   REFERRING PROVIDER: [name], MD   REFERRING DIAG: M54.2 (ICD-10-CM) - Cervicalgia   THERAPY DIAG:  Cervicalgia  Abnormal posture  Muscle weakness (generalized)  RATIONALE FOR EVALUATION AND TREATMENT: Rehabilitation  ONSET DATE: 10+ years ago  NEXT MD VISIT: 05/13/2024   SUBJECTIVE:                                                                                                                                                                                                          SUBJECTIVE STATEMENT: States 5/10 B neck pain and was worse yesterday due to headache.   She reports she is doing her HEP at home.   EVAL:  States has had neck pain, headaches, vertigo for almost 2 years.   She came here for vertigo in past, but was unable to keep coming in due to deaths in the family.  Her neck pain, vertigo, HA have persisted.  She reports the pain is intermittent and is over the central cervical spine, B upper trap area, and down her back along the scapular border.  She c/o only  Intermittent tingling into arms/hands.  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  She also has a toddler at home that she cares for.    Hand dominance: Right  PAIN: Are you having pain? Yes: NPRS scale: 2-3/10 now;  7/10 worst Pain location: B neck and UT Pain description: aching Aggravating factors: bending over to lift Relieving factors: lying on back, heat, ibuprofen , massage, pillow under neck  PERTINENT HISTORY:  HTN, PE (2020), GERD, anxiety, depression, BPPV, asthma   HPI: This is Ms. Jennifer Mcgrath, a 48 y.o. right-handed female with a medical history of HTN, PE (2020), GERD who presents to neurology clinic with the chief complaint of headaches and numbness. The patient is alone today.   Patient's symptoms started at least 10 years ago. In 2020 when she had blood clots in her lungs she felt it got more noticeable. She gave birth and also had COVID around this time. She started having headaches and numbness in her left arm. She will have chest pain and shortness of breath as well. She feels like symptoms were sometimes associated with high blood pressure. In 12/10/23 she had a left sided headache with left sided numbness and tingling in face, arm, and leg. She went to ED. Labs, CT head, and MRI brain w/wo contrast were normal. She has not had these diffuse numbness since.    She  occasionally gets black spots in her vision. She is not sure if this is associated with her headaches.   The numbness and headaches do not always come together. She describes her headache has left sided tension, throbbing. She rates the pain as 4/10. She endorses neck pain with the headache. She endorses photophobia, phonophobia, and nausea with her headaches. She sometimes has dizziness like every she is on a boat rocking with her headaches. She may also blurry vision but no loss of vision. Her headache will last a few hours. She will take ibuprofen  or Goody's powder and sometimes meclizine  and will lay down. These do help. She takes meclizine  about twice a month. She will take OTC NSAID 2-3 times per week. She will have a headache at least 6-7 times per month.   Regarding her sleep, she sleeps about 8 hours per night. She endorses snoring. She does not feel well rested when she wakes. She feels tired throughout the day and will take naps if able.   Regarding mood, she endorses anxiety and depression.   PRECAUTIONS: None  RED FLAGS: None  HAND DOMINANCE: Right  WEIGHT BEARING RESTRICTIONS: No  FALLS:  Has patient fallen in last 6 months? 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   FINDINGS:   BRAIN AND VENTRICLES: No acute infarct. No acute intracranial hemorrhage. No mass effect or midline shift. No hydrocephalus. The sella is unremarkable. Normal flow voids. No mass or abnormal enhancement.   ORBITS: No  acute  abnormality.   SINUSES: No acute abnormality.   BONES AND SOFT TISSUES: Normal bone marrow signal and enhancement. No acute soft tissue abnormality.   CONTRAST: 8mL gadobutrol  (GADAVIST ) 1 MMOL/ML injection 8 mL GADOBUTROL  1 MMOL/ML IV SOLN   IMPRESSION: 1. No acute intracranial abnormality. 2. No mass or abnormal enhancement.  12/10/23 - CT HEAD WITHOUT CONTRAST CLINICAL DATA: Headache   FINDINGS: Brain: No evidence of acute infarction, hemorrhage, hydrocephalus, extra-axial collection or mass lesion/mass effect.   Vascular: No hyperdense vessel or unexpected calcification.   Skull: Normal. Negative for fracture or focal lesion.   Sinuses/Orbits: No acute finding.   Other: None   IMPRESSION: Negative non contrasted CT appearance of the brain.  PATIENT SURVEYS:  NDI:  NECK DISABILITY INDEX  Date: 03/24/2024 Score  Pain intensity 0 = I have no pain at the moment  2. Personal care (washing, dressing, etc.) 0 = I can look after myself normally without causing extra pain  3. Lifting 1 =  I can lift heavy weights but it gives extra pain  4. Reading 1 = I can read as much as I want to with slight pain in my neck  5. Headaches 1 =  I have slight headaches, which come infrequently  6. Concentration 0 =  I can concentrate fully when I want to with no difficulty  7. Work 1 =  I can only do my usual work, but no more  8. Driving 2 =  I can drive my car as long as I want with moderate pain in my neck  9. Sleeping 0 = I have no trouble sleeping  10. Recreation 1 =  I am able to engage in all my recreation activities, with some pain in my neck  Total 7/50   Minimum Detectable Change (90% confidence): 5 points or 10% points  COGNITION: Overall cognitive status: Within functional limits for tasks assessed  SENSATION: WFL  POSTURE:  rounded shoulders, forward head, and increased thoracic kyphosis  has upper thoracic kyphosis  PALPATION: Very TTP w/ trigger points in B UT,  levator, middle traps and rhomboids Cervical P/A glides are somewhat restricted Very TTP over C6-T2 spinous processes    CERVICAL ROM:   Active ROM A/PROM  eval  Flexion 80%  Extension 50%  Right lateral flexion 60  Left lateral flexion 60  Right rotation 90  Left rotation 80   (Blank rows = not tested)  UPPER EXTREMITY ROM:  Active ROM Right eval Left eval  Shoulder flexion 180 180  Shoulder extension    Shoulder abduction 180 180  Shoulder adduction    Shoulder internal rotation T6 T8  Shoulder external rotation T2/90 deg T2/90 deg  Elbow flexion    Elbow extension    Wrist flexion    Wrist extension    Wrist ulnar deviation    Wrist radial deviation    Wrist pronation    Wrist supination     (Blank rows = not tested)  UPPER EXTREMITY MMT:  MMT Right eval Left eval  Shoulder flexion 4+ 4+  Shoulder extension    Shoulder abduction 4- 4  Shoulder adduction    Shoulder internal rotation 5 5  Shoulder external rotation 4- 4-  Middle trapezius 3+ 4-  Lower trapezius NT NT  Elbow flexion    Elbow extension    Wrist flexion    Wrist extension    Wrist ulnar deviation    Wrist radial deviation    Wrist pronation  Wrist supination    Grip strength     (Blank rows = not tested)  CERVICAL SPECIAL TESTS:  Upper limb tension test (ULTT): Negative, Spurling's test: Positive, and Distraction test: Positive compression is positive for increased neck pain but no radiation.   Distraction relieves neck pain  FUNCTIONAL TESTS:  Na   TODAY'S TREATMENT:  03/24/24 THERAPEUTIC EXERCISE: To improve strength, endurance, and ROM.  Demonstration, verbal and tactile cues throughout for technique. UBE L1.5 x 5' back  NEUROMUSCULAR RE-EDUCATION: To improve kinesthesia and posture. Wall y's x 20 BUE Wall T's x 20 BUE Wall arrows x 20  BUE Doorway W's x 20 BUE Foam roll snow angels x 20 BUE  THERAPEUTIC ACTIVITIES: To improve functional performance.  Demonstration,  verbal and tactile cues throughout for technique. Vertical foam roller lying w/ arms out pec stretch Horizontal foam roller thoracic extension w/ 5 sec holds x 10 Small pool noodle cervical rotation x 1' Small pool noodle suboccipital head nods x 1' Seated UT stretch x 1' x 2 BUE w/ PT MFR   MANUAL THERAPY: To promote reduced pain utilizing manual TP therapy and myofascial release. Deep pressure Tp massage/release to B UT Tp;   Suboccipital release;  gentle grade 3-4 cervical extension mobilizations x 10 each level  HP/TENS estim to B UT at intensity to patient tolerance x 15'  03/08/24 SELF CARE: Provided education on PT POC progression.  MODALITIES:  HP/premod electrical stim x 15' to B Cspine/UT area   PATIENT EDUCATION:  Education details: HEP review and HEP update  Person educated: Patient Education method: Explanation, Demonstration, Verbal cues, Tactile cues, Handouts, and MedBridgeGO app access provided Education comprehension: verbalized understanding, verbal cues required, tactile cues required, and needs further education  HOME EXERCISE PROGRAM: Access Code: [email or web address] URL: [email or web address] Date: 03/08/2024 Prepared by: Garnette Montclair  Exercises - Seated Cervical Retraction  - 1 x daily - 7 x weekly - 1 sets - 10 reps - 3 sec hold - Shoulder External Rotation and Scapular Retraction with Resistance  - 1 x daily - 7 x weekly - 3 sets - 10 reps - Standing Scapular Depression  - 1 x daily - 7 x weekly - 3 sets - 10 reps - Seated Thoracic Self Mobilization  - 1 x daily - 7 x weekly - 3 sets - 10 reps  ASSESSMENT:  CLINICAL IMPRESSION: Patient is able to progress with her postural strengthening today with good tolerance.   She is very busy so time for HEP will be important.   She is able to get relief with MFR and estim.  Needs to work on cervical vertebral mobility due to postural deficits with upper thoracic kyphosis.  Needs further postural  strengthening to correct forward head posture.   PT remains necessary for pain, ROM, strength, HEP deficit.s  Continue per POC  EVAL: Jennifer Mcgrath is a 48 y.o. female who was referred to physical therapy for evaluation and treatment for neck pain.   Patient reports onset of neck pain beginning about 2 years ago for no apparent reason, but has only continued to worsen.  She denies any trauma, MVA, hitting head or    She also has vertigo and headaches and feels that all 3 of these problems are related.   She did some therapy here for vertigo last year, but wasn't able to continue due to some deaths in her family.   She mainly wants to focus on neck pain with this episode of care. Pain is  worse with bending forward to lift things.   She works at a desk all day and gets pain with this as well.    She has considerable postural muscle weakness and poor posture.  Patient has deficits in cervical ROM, cervical flexibility, BUE strength, abnormal posture, and TTP with abnormal muscle tension  which are interfering with ADLs and are impacting quality of life.  On NDI patient scored 7/50 demonstrating mild functional limitation/ disability.  Jennifer Mcgrath will benefit from skilled PT to address above deficits to improve mobility and activity tolerance with decreased pain interference.  OBJECTIVE IMPAIRMENTS: decreased ROM, decreased strength, impaired UE functional use, postural dysfunction, and pain.   ACTIVITY LIMITATIONS: carrying, lifting, bending, and reach over head  PARTICIPATION LIMITATIONS: cleaning, laundry, driving, and occupation  PERSONAL FACTORS: Age, Fitness, Time since onset of injury/illness/exacerbation, and 1-2 comorbidities: HTN, PE (2020), GERD, anxiety, depression, BPPV, asthma are also affecting patient's functional outcome.   REHAB POTENTIAL: Good  CLINICAL DECISION MAKING: Evolving/moderate complexity  EVALUATION COMPLEXITY: Moderate   GOALS: Goals reviewed with patient? Yes  SHORT TERM  GOALS: Target date: 04/05/2024   Patient will be independent with initial HEP to improve outcomes and carryover.  Baseline: 100% PT assist required for correct completion Goal status: IN PROGRESS  2.  Patient will report 25% improvement in neck pain to improve QOL.  Baseline: 7/10 worst 03/24/24:  5/10 today Goal status: IN PROGRESS  3.  Patient will demo 75% cervical ROM all planes Baseline: see ROM tables above Goal status: INITIAL  LONG TERM GOALS: Target date: 05/03/2024   Patient will be independent with ongoing/advanced HEP for self-management at home.  Baseline: no advanced HEP yet Goal status: INITIAL  2.  Patient will demonstrate improved posture to decrease muscle imbalance. Baseline: forward head/thoracic kyphosis Goal status: INITIAL  3.  Patient will report 50-75% improvement in neck pain to improve QOL.  Baseline: 7/10 worst Goal status: INITIAL  4.  Patient to report 50-75% reduction in frequency and intensity of weekly headaches/migraines.   Baseline:  Goal status: INITIAL   5.  Patient will demonstrate full pain free cervical ROM for safety with driving.  Baseline: Refer to above cervical ROM table Goal status: INITIAL  6.  Patient will report </= 0/50 on NDI (MCID = 15%) to demonstrate improved functional ability.  Baseline: 7/50 Goal status: INITIAL  PLAN:  PT FREQUENCY: 1-2x/week  PT DURATION: 8 weeks  PLANNED INTERVENTIONS: 97164- PT Re-evaluation, 97110-Therapeutic exercises, 97530- Therapeutic activity, 97112- Neuromuscular re-education, 97535- Self Care, [id number]- Manual therapy, [phone]- Canalith repositioning, [id number]- Electrical stimulation (unattended), 97016- Vasopneumatic device, [id number]- Ultrasound, 02987- Traction (mechanical), [id number]- Ionotophoresis 4mg /ml Dexamethasone, Patient/Family education, Taping, Joint mobilization, Spinal mobilization, Cryotherapy, and Moist heat  PLAN FOR NEXT SESSION: see how treatment last time went;   continue  scapular/postural strengthening; door way stretches for pec major and minor;  maybe try chirp wheel for upper thoracic mobilizations;  continue MFR;  modalities PRN  PHYSICAL THERAPY DISCHARGE SUMMARY  Visits from Start of Care: 2  Current functional level related to goals / functional outcomes: Patient only came in for 2 visits for her neck and arm pain and headaches.   I feel like we could have helped her more if she had been able to do more therapy with us .   WE have not heard back from her so we will D/C PT at this time.   We are happy to see her again PRN.   Thanks.   Remaining deficits: Mostly unchanged  due to only coming in for 2 visits   Education / Equipment: Initial HEP provided   Patient agrees to discharge. Patient goals were not met. Patient is being discharged due to not returning since the last visit.   Jennifer Mcgrath, PT 03/24/2024, 3:51 PM      "

## 2024-03-28 ENCOUNTER — Ambulatory Visit: Admitting: Rehabilitation

## 2024-03-31 ENCOUNTER — Ambulatory Visit

## 2024-04-05 ENCOUNTER — Ambulatory Visit: Attending: Neurology | Admitting: Rehabilitation

## 2024-04-06 ENCOUNTER — Ambulatory Visit (INDEPENDENT_AMBULATORY_CARE_PROVIDER_SITE_OTHER): Admitting: Family

## 2024-04-06 ENCOUNTER — Ambulatory Visit (HOSPITAL_BASED_OUTPATIENT_CLINIC_OR_DEPARTMENT_OTHER): Payer: Self-pay

## 2024-04-06 ENCOUNTER — Encounter: Payer: Self-pay | Admitting: Family

## 2024-04-06 VITALS — BP 139/91 | HR 66 | Resp 16 | Ht 62.0 in | Wt 178.4 lb

## 2024-04-06 DIAGNOSIS — L309 Dermatitis, unspecified: Secondary | ICD-10-CM

## 2024-04-06 DIAGNOSIS — G43909 Migraine, unspecified, not intractable, without status migrainosus: Secondary | ICD-10-CM

## 2024-04-06 DIAGNOSIS — D649 Anemia, unspecified: Secondary | ICD-10-CM | POA: Diagnosis not present

## 2024-04-06 DIAGNOSIS — Z1231 Encounter for screening mammogram for malignant neoplasm of breast: Secondary | ICD-10-CM | POA: Diagnosis not present

## 2024-04-06 DIAGNOSIS — Z1211 Encounter for screening for malignant neoplasm of colon: Secondary | ICD-10-CM | POA: Diagnosis not present

## 2024-04-06 DIAGNOSIS — J45909 Unspecified asthma, uncomplicated: Secondary | ICD-10-CM

## 2024-04-06 DIAGNOSIS — I10 Essential (primary) hypertension: Secondary | ICD-10-CM | POA: Diagnosis not present

## 2024-04-06 NOTE — Progress Notes (Unsigned)
 Subjective:     Patient ID: Jennifer Mcgrath, female    DOB: [birth year], 48 y.o.   MRN: [id number]  Chief Complaint  Patient presents with   Follow-up    Patient is here for a follow-up   Hypertension    HTN Follow-up    HPI  Discussed the use of AI scribe software for clinical note transcription with the patient, who gave verbal consent to proceed.  History of Present Illness  Jennifer Mcgrath is a 48 year old female with vertigo and hypertension who presents for a follow-up visit.  She experiences ongoing vertigo with headaches. Meclizine  is well-tolerated, but sumatriptan  causes wooziness. She has not tried a lower dose of sumatriptan . Numbness in her arm was evaluated at the hospital. She is uncertain about one medication, suspecting it may cause wooziness.  Her blood pressure has been elevated, with home readings up to 160/101 mmHg, possibly due to stress from personal circumstances, including the recent passing of her son's father. Usual readings are between 130-138/70-80 mmHg. She takes losartan  50 mg, spironolactone , and nifedipine . She also takes a potassium supplement and B1 supplements for previously low thiamine levels.  She experiences heavy menstrual periods and uses over-the-counter iron supplements due to cost concerns with pharmacy prices. Her asthma is well-managed.     Health Maintenance Due  Topic Date Due   COVID-19 Vaccine (1) Never done   Pneumococcal Vaccine: 50+ Years (1 of 2 - PCV) Never done   Hepatitis B Vaccines 19-59 Average Risk (1 of 3 - 19+ 3-dose series) Never done   Zoster Vaccines- Shingrix (1 of 2) Never done   Colonoscopy  Never done    Past Medical History:  Diagnosis Date   Benign hematuria    Depression    GERD (gastroesophageal reflux disease)    Hypertension    Iron deficiency anemia    Microscopic hematuria    PE (pulmonary thromboembolism) (HCC)    Umbilical hernia     Past Surgical History:  Procedure Laterality Date   CESAREAN  SECTION  1992, 1999, 2003, 2008, 2020   TUBAL LIGATION  2020    Family History  Problem Relation Age of Onset   Diabetes Mellitus II Mother        borderline   Hematuria Father    Diabetes Mellitus II Father    Osteogenesis imperfecta Son     Social History   Socioeconomic History   Marital status: Single    Spouse name: Not on file   Number of children: Not on file   Years of education: Not on file   Highest education level: Not on file  Occupational History   Not on file  Tobacco Use   Smoking status: Never   Smokeless tobacco: Never  Vaping Use   Vaping status: Never Used  Substance and Sexual Activity   Alcohol use: Yes    Comment: occasional    Drug use: No   Sexual activity: Yes    Birth control/protection: Surgical  Other Topics Concern   Not on file  Social History Narrative   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a Programme researcher, broadcasting/film/video   5 children (3 boys and 2 girls)   3 Grandchildren   Enjoys reading the bible   No pets   Caffiene 2-3 soda a day   Social Drivers of Corporate Investment Banker Strain: Not on file  Food Insecurity: Not on file  Transportation Needs: Not on file  Physical Activity: Unknown (04/12/2019)   Received  from [facility] visits prior to 08/02/2022.   Exercise Vital Sign    On average, how many days per week do you engage in moderate to strenuous exercise (like a brisk walk)?: Patient refused    On average, how many minutes do you engage in exercise at this level?: Patient refused  Stress: Unknown (04/12/2019)   Received from [facility] visits prior to 08/02/2022.   [employer] of Occupational Health - Occupational Stress Questionnaire    Feeling of Stress : Patient refused  Social Connections: Unknown (04/12/2019)   Received from [facility] visits prior to 08/02/2022.   Social Connection and Isolation Panel    In a typical week, how many times do you talk on the phone with  family, friends, or neighbors?: Patient refused    How often do you get together with friends or relatives?: Patient refused    How often do you attend church or religious services?: Patient refused    Do you belong to any clubs or organizations such as church groups, unions, fraternal or athletic groups, or school groups?: Patient refused    How often do you attend meetings of the clubs or organizations you belong to?: Patient refused    Are you married, widowed, divorced, separated, never married, or living with a partner?: Patient refused  Intimate Partner Violence: Unknown (04/12/2019)   Received from [facility] visits prior to 08/02/2022.   Humiliation, Afraid, Rape, and Kick questionnaire    Within the last year, have you been afraid of your partner or ex-partner?: Patient refused    Within the last year, have you been humiliated or emotionally abused in other ways by your partner or ex-partner?: Patient refused    Within the last year, have you been kicked, hit, slapped, or otherwise physically hurt by your partner or ex-partner?: Patient refused    Within the last year, have you been raped or forced to have any kind of sexual activity by your partner or ex-partner?: Patient refused    Outpatient Medications Prior to Visit  Medication Sig Dispense Refill   albuterol  (VENTOLIN  HFA) 108 (90 Base) MCG/ACT inhaler Inhale 2 puffs into the lungs every 6 (six) hours as needed for wheezing or shortness of breath. 8 g 5   Ascorbic Acid (VITAMIN C) 1000 MG tablet Take 1,000 mg by mouth daily.     Ferrous Sulfate 140 (45 Fe) MG TBCR Take 1 tablet by mouth daily.     losartan  (COZAAR ) 50 MG tablet TAKE 1 TABLET(50 MG) BY MOUTH DAILY 90 tablet 1   magnesium  oxide (MAG-OX) 400 (240 Mg) MG tablet Take 1 tablet by mouth 2 (two) times daily.     meclizine  (ANTIVERT ) 25 MG tablet TAKE 1 TABLET(25 MG) BY MOUTH THREE TIMES DAILY AS NEEDED FOR DIZZINESS 30 tablet 0   NIFEdipine   (PROCARDIA  XL/NIFEDICAL XL) 60 MG 24 hr tablet TAKE 1 TABLET(60 MG) BY MOUTH DAILY 90 tablet 1   nortriptyline  (PAMELOR ) 10 MG capsule Take 2 capsules (20 mg total) by mouth at bedtime. Take 1 capsule (10 mg) at bedtime for 1 week, then increase to 2 capsules (20 mg) at bedtime thereafter 60 capsule 5   nystatin  (MYCOSTATIN /NYSTOP ) powder Apply 1 Application topically 2 (two) times daily. 30 g 1   Omega-3 Fatty Acids (FISH OIL) 1000 MG CAPS Take by mouth.     potassium chloride  SA (KLOR-CON  M) 20 MEQ tablet Take 1 tablet (20  mEq total) by mouth 2 (two) times daily. 180 tablet 1   spironolactone  (ALDACTONE ) 25 MG tablet Take 1 tablet (25 mg total) by mouth daily. Needs appt 30 tablet 0   SUMAtriptan  (IMITREX ) 100 MG tablet Take 1 tablet (100 mg total) by mouth once as needed for up to 1 dose. May repeat in 2 hours if headache persists or recurs. 10 tablet 5   zinc gluconate 50 MG tablet Take 50 mg by mouth daily.     nitrofurantoin , macrocrystal-monohydrate, (MACROBID ) 100 MG capsule Take 1 capsule (100 mg total) by mouth 2 (two) times daily. 10 capsule 0   No facility-administered medications prior to visit.    Not on File  ROS See HPI    Objective:    Physical Exam Constitutional:      Appearance: Normal appearance. She is well-developed.  Cardiovascular:     Rate and Rhythm: Normal rate and regular rhythm.     Heart sounds: Normal heart sounds. No murmur heard. Pulmonary:     Effort: Pulmonary effort is normal. No respiratory distress.     Breath sounds: Normal breath sounds. No wheezing.  Neurological:     Mental Status: She is alert.  Psychiatric:        Behavior: Behavior normal.        Thought Content: Thought content normal.        Judgment: Judgment normal.      BP (!) 139/91 (BP Location: Right Arm, Patient Position: Sitting, Cuff Size: Large)   Pulse 66   Resp 16   Ht 5' 2 (1.575 m)   Wt 178 lb 6.4 oz (80.9 kg)   SpO2 100%   BMI 32.63 kg/m  Wt Readings from  Last 3 Encounters:  04/06/24 178 lb 6.4 oz (80.9 kg)  02/16/24 175 lb (79.4 kg)  02/05/24 177 lb (80.3 kg)       Assessment & Plan:   Problem List Items Addressed This Visit       Unprioritized   Primary hypertension   Uncontrolled. Will increase losartan  from 50 mg to 75 mg.        Relevant Orders   Basic Metabolic Panel (BMET)   Migraine    Migraines managed with sumatriptan , effective but causes wooziness. Considering dose reduction for better tolerance. - Consider reducing sumatriptan  dose to 50 mg to improve tolerance.       Eczema   Stable.        Asthma   Stable with prn use of albuterol        Anemia   Pt is having heavy periods. Low thiamine levels noted. B1 supplements ongoing. Iron supplementation discontinued due to cost. Discussed affordable alternatives. - Continue B1 supplementation. - Will check iron levels and blood count at next visit. - Advised obtaining supplements from pharmacy for cost savings.      Relevant Orders   IBC + Ferritin   CBC w/Diff   Other Visit Diagnoses       Screening for colon cancer    -  Primary   Relevant Orders   Ambulatory referral to Gastroenterology     Breast cancer screening by mammogram       Relevant Orders   MM 3D SCREENING MAMMOGRAM BILATERAL BREAST       I have discontinued Latoia Chovan's nitrofurantoin  (macrocrystal-monohydrate). I am also having her maintain her Ferrous Sulfate, magnesium  oxide, zinc gluconate, Fish Oil, vitamin C, nystatin , albuterol , potassium chloride  SA, losartan , NIFEdipine , meclizine , SUMAtriptan , nortriptyline , and spironolactone .  No orders of the defined types were placed in this encounter.

## 2024-04-06 NOTE — Assessment & Plan Note (Signed)
 Stable

## 2024-04-06 NOTE — Assessment & Plan Note (Signed)
Stable with prn use of albuterol.  

## 2024-04-06 NOTE — Assessment & Plan Note (Signed)
 Uncontrolled. Will increase losartan  from 50 mg to 75 mg.

## 2024-04-07 DIAGNOSIS — G43909 Migraine, unspecified, not intractable, without status migrainosus: Secondary | ICD-10-CM | POA: Insufficient documentation

## 2024-04-07 NOTE — Assessment & Plan Note (Addendum)
 Pt is having heavy periods. Low thiamine levels noted. B1 supplements ongoing. Iron supplementation discontinued due to cost. Discussed affordable alternatives. - Continue B1 supplementation. - Will check iron levels and blood count at next visit. - Advised obtaining supplements from pharmacy for cost savings.

## 2024-04-07 NOTE — Patient Instructions (Signed)
 VISIT SUMMARY:  Today, you had a follow-up visit to address your vertigo, hypertension, migraines, and anemia. We discussed your current medications, made some adjustments, and planned for further follow-ups and tests.  YOUR PLAN:  ESSENTIAL HYPERTENSION: Your blood pressure has been elevated, likely due to stress. -Increase losartan  to 75 mg daily. -Take spironolactone  in the morning to reduce nighttime urination. -Take losartan  and nifedipine  in the morning for convenience. -Follow up in two weeks with the nurse for a blood pressure check and labs.  VERTIGO: You have ongoing vertigo, which is being managed with meclizine . -Continue taking meclizine  as needed. -Follow up with Dr. Leigh on December 12th.  MIGRAINE: Your migraines are managed with sumatriptan , but it causes wooziness. -Consider reducing the sumatriptan  dose to 50 mg to improve tolerance.  UNSPECIFIED ANEMIA: You have low thiamine levels and have been taking B1 supplements. You stopped iron supplements due to cost. -Continue taking B1 supplements. -We will check your iron levels and blood count at the next visit. -Consider getting supplements from the pharmacy for cost savings.  GENERAL HEALTH MAINTENANCE: Routine screenings and preventive measures were discussed. -A colonoscopy and mammogram have been ordered. -Family members should start colon cancer screening at age 48 due to family history.

## 2024-04-07 NOTE — Assessment & Plan Note (Signed)
  Migraines managed with sumatriptan , effective but causes wooziness. Considering dose reduction for better tolerance. - Consider reducing sumatriptan  dose to 50 mg to improve tolerance.

## 2024-05-03 ENCOUNTER — Inpatient Hospital Stay (HOSPITAL_BASED_OUTPATIENT_CLINIC_OR_DEPARTMENT_OTHER)
Admission: RE | Admit: 2024-05-03 | Discharge: 2024-05-03 | Disposition: A | Source: Ambulatory Visit | Attending: Family | Admitting: Family

## 2024-05-03 ENCOUNTER — Encounter (HOSPITAL_BASED_OUTPATIENT_CLINIC_OR_DEPARTMENT_OTHER): Payer: Self-pay

## 2024-05-03 DIAGNOSIS — Z1231 Encounter for screening mammogram for malignant neoplasm of breast: Secondary | ICD-10-CM | POA: Insufficient documentation

## 2024-05-05 NOTE — Progress Notes (Signed)
 NEUROLOGY FOLLOW UP OFFICE NOTE  Jennifer Mcgrath [id number]  Subjective:  Jennifer Mcgrath is a 48 y.o. year old right-handed female with a medical history of HTN, PE (2020), GERD who we last saw on 02/05/24 for headaches and numbness.  To briefly review: 02/05/24: Patient's symptoms started at least 10 years ago. In 2020 when she had blood clots in her lungs she felt it got more noticeable. She gave birth and also had COVID around this time. She started having headaches and numbness in her left arm. She will have chest pain and shortness of breath as well. She feels like symptoms were sometimes associated with high blood pressure. In 12/10/23 she had a left sided headache with left sided numbness and tingling in face, arm, and leg. She went to ED. Labs, CT head, and MRI brain w/wo contrast were normal. She has not had these diffuse numbness since.    She occasionally gets black spots in her vision. She is not sure if this is associated with her headaches.   The numbness and headaches do not always come together. She describes her headache has left sided tension, throbbing. She rates the pain as 4/10. She endorses neck pain with the headache. She endorses photophobia, phonophobia, and nausea with her headaches. She sometimes has dizziness like every she is on a boat rocking with her headaches. She may also blurry vision but no loss of vision. Her headache will last a few hours. She will take ibuprofen  or Goody's powder and sometimes meclizine  and will lay down. These do help. She takes meclizine  about twice a month. She will take OTC NSAID 2-3 times per week. She will have a headache at least 6-7 times per month.   Regarding her sleep, she sleeps about 8 hours per night. She endorses snoring. She does not feel well rested when she wakes. She feels tired throughout the day and will take naps if able.   Regarding mood, she endorses anxiety and depression.   Smoker: No OCP/hormone use: no Caffiene use: 2-3  sodas per week, occasional tea EtOH use: 1-2 beers on weekends; previously more Restrictive diet: no Family history of neurologic disease including headaches: No  Most recent Assessment and Plan (02/05/24): Jennifer Mcgrath is a 48 y.o. female who presents for evaluation of left sided headache and intermittent numbness of left side of body. She has a relevant medical history of HTN, PE (2020), GERD. Her neurological examination is normal today. Available diagnostic data is significant for MRI brain that was normal. Patient's headaches sound like migraine, perhaps with aura. She likely has contributions from cervicalgia. She may also have undiagnosed sleep apnea that could contribute. Migraine could explain intermittent numbness and vertigo. She is currently having 7-8 headaches per month so would benefit from a preventative medication.   PLAN: -Blood work: TSH, B12, B1 -Sleep medicine referral -Physical therapy for cervicalgia -For migraines: Migraine prevention:  Start nortriptyline  10 mg at bedtime for 1 week, then increase to 20 mg at bedtime thereafter Migraine rescue:  Sumatriptan  100 mg as needed at headache onset, can repeat in 2 hours if needed Limit use of pain relievers to no more than 2 days out of week to prevent risk of rebound or medication-overuse headache. Keep headache diary  Since their last visit: Labs were significant for low B1. We recommended patient start B1 100 mg daily. Patient is taking this.  Home sleep study was consistent with moderate OSA. She needs to follow up with sleep medicine about this.  Her headaches are better. She does not remember the last time she had a significant headache. She was taking nortriptyline , but her headaches went away. She is currently only taking it once a week. Sumatriptan  will help her headaches when she has one.   MEDICATIONS:  Outpatient Encounter Medications as of 05/13/2024  Medication Sig   albuterol  (VENTOLIN  HFA) 108 (90 Base)  MCG/ACT inhaler Inhale 2 puffs into the lungs every 6 (six) hours as needed for wheezing or shortness of breath.   Ascorbic Acid (VITAMIN C) 1000 MG tablet Take 1,000 mg by mouth daily.   Ferrous Sulfate 140 (45 Fe) MG TBCR Take 1 tablet by mouth daily.   losartan  (COZAAR ) 50 MG tablet TAKE 1 TABLET(50 MG) BY MOUTH DAILY   magnesium  oxide (MAG-OX) 400 (240 Mg) MG tablet Take 1 tablet by mouth 2 (two) times daily.   meclizine  (ANTIVERT ) 25 MG tablet TAKE 1 TABLET(25 MG) BY MOUTH THREE TIMES DAILY AS NEEDED FOR DIZZINESS   NIFEdipine  (PROCARDIA  XL/NIFEDICAL XL) 60 MG 24 hr tablet TAKE 1 TABLET(60 MG) BY MOUTH DAILY   nortriptyline  (PAMELOR ) 10 MG capsule Take 2 capsules (20 mg total) by mouth at bedtime. Take 1 capsule (10 mg) at bedtime for 1 week, then increase to 2 capsules (20 mg) at bedtime thereafter   nystatin  (MYCOSTATIN /NYSTOP ) powder Apply 1 Application topically 2 (two) times daily.   Omega-3 Fatty Acids (FISH OIL) 1000 MG CAPS Take by mouth.   potassium chloride  SA (KLOR-CON  M) 20 MEQ tablet Take 1 tablet (20 mEq total) by mouth 2 (two) times daily.   spironolactone  (ALDACTONE ) 25 MG tablet Take 1 tablet (25 mg total) by mouth daily. Needs appt   SUMAtriptan  (IMITREX ) 100 MG tablet Take 1 tablet (100 mg total) by mouth once as needed for up to 1 dose. May repeat in 2 hours if headache persists or recurs.   zinc gluconate 50 MG tablet Take 50 mg by mouth daily.   No facility-administered encounter medications on file as of 05/13/2024.    PAST MEDICAL HISTORY: Past Medical History:  Diagnosis Date   Benign hematuria    Depression    GERD (gastroesophageal reflux disease)    Hypertension    Iron deficiency anemia    Microscopic hematuria    PE (pulmonary thromboembolism) (HCC)    Umbilical hernia     PAST SURGICAL HISTORY: Past Surgical History:  Procedure Laterality Date   CESAREAN SECTION  1992, 1999, 2003, 2008, 2020   TUBAL LIGATION  2020    ALLERGIES: Not on  File  FAMILY HISTORY: Family History  Problem Relation Age of Onset   Diabetes Mellitus II Mother        borderline   Hematuria Father    Diabetes Mellitus II Father    Osteogenesis imperfecta Son     SOCIAL HISTORY: Social History   Tobacco Use   Smoking status: Never   Smokeless tobacco: Never  Vaping Use   Vaping status: Never Used  Substance Use Topics   Alcohol use: Yes    Comment: occasional    Drug use: No   Social History   Social History Narrative   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a Programme researcher, broadcasting/film/video   5 children (3 boys and 2 girls)   3 Grandchildren   Enjoys reading the bible   No pets   Caffiene 2-3 soda a day      Objective:  Vital Signs:  BP 126/81   Pulse 75   Temp 98.1 F (  36.7 C) (Oral)   Resp 14   Ht 5' 2 (1.575 m)   Wt 177 lb (80.3 kg)   SpO2 97%   BMI 32.37 kg/m   General: No acute distress.  Patient appears well-groomed.   Head:  Normocephalic/atraumatic Eyes:  Fundi examined, disc margins clear, no obvious papilledema Neck: supple Heart:  Regular rate and rhythm Lungs:  Clear to auscultation bilaterally Neurological Exam: alert and oriented.  Speech fluent and not dysarthric, language intact.  CN II-XII intact. Bulk and tone normal, muscle strength 5/5 throughout.  Sensation to light touch intact.  Deep tendon reflexes 2+ throughout.  Finger to nose testing intact.  Gait normal.   Labs and Imaging review: New results: 02/05/24: TSH wnl B12: 746 B1: low < 6  Home sleep study (03/23/24):   Previously reviewed results: 12/10/23: CMP unremarkable CBC unremarkable   Ferritin (10/23/23): 19   11/21/22: TSH wnl Vit D wnl B12: 651   External labs: Lipid panel (07/07/22): tChol 121, LDL 55, TG 56   Imaging/Procedures: CT head wo contrast (12/10/23): Negative non contrasted CT appearance of the brain.    MRI brain w/wo contrast (12/11/23): IMPRESSION: 1. No acute intracranial abnormality. 2. No mass or abnormal  enhancement.  Assessment/Plan:  This is Jennifer Mcgrath, a 48 y.o. female with: Migraine with aura - after starting nortriptyline  patient stopped having significant headaches. She is currently only taking nortriptyline  once per week. When she had headaches, they responded to Sumatriptan . She does not remember when she last had a headache though. Cervicalgia may also be contributing to headaches. OSA - moderate by sleep study. Patient has not followed up with sleep medicine since sleep study Thiamine deficiency   Plan: -Follow up with sleep medicine for moderate OSA -Continue B1 100 mg daily -For migraines: Migraine prevention:  Stop nortriptyline  as she is only taking once a week and not having headaches Migraine rescue:  Sumatriptan  100 mg as needed at headache onset, can repeat in 2 hours if needed  Limit use of pain relievers to no more than 2 days out of week to prevent risk of rebound or medication-overuse headache. Keep headache diary   Return to clinic in 6 months   Venetia Potters, MD

## 2024-05-06 ENCOUNTER — Ambulatory Visit: Admitting: Family

## 2024-05-10 ENCOUNTER — Ambulatory Visit: Payer: Self-pay | Admitting: Family

## 2024-05-13 ENCOUNTER — Ambulatory Visit: Admitting: Neurology

## 2024-05-13 ENCOUNTER — Encounter: Payer: Self-pay | Admitting: Neurology

## 2024-05-13 VITALS — BP 126/81 | HR 75 | Temp 98.1°F | Resp 14 | Ht 62.0 in | Wt 177.0 lb

## 2024-05-13 DIAGNOSIS — E519 Thiamine deficiency, unspecified: Secondary | ICD-10-CM | POA: Diagnosis not present

## 2024-05-13 DIAGNOSIS — G4733 Obstructive sleep apnea (adult) (pediatric): Secondary | ICD-10-CM | POA: Diagnosis not present

## 2024-05-13 DIAGNOSIS — M542 Cervicalgia: Secondary | ICD-10-CM | POA: Diagnosis not present

## 2024-05-13 DIAGNOSIS — G43109 Migraine with aura, not intractable, without status migrainosus: Secondary | ICD-10-CM | POA: Diagnosis not present

## 2024-05-13 NOTE — Patient Instructions (Addendum)
 Please make an appointment with sleep medicine to discuss your sleep apnea.  For your headaches: -Stop nortriptyline . Taking it once a week is likely not helping you. -Continue Sumatriptan  100 mg as needed at headache onset, can repeat in 2 hours if needed -Limit use of pain relievers to no more than 2 days out of week to prevent risk of rebound or medication-overuse headache. -Keep headache diary   Return to clinic in 6 months  Please let me know if you have any questions or concerns in the meantime.  The physicians and staff at [facility] are committed to providing excellent care. You may receive a survey requesting feedback about your experience at our office. We strive to receive very good responses to the survey questions. If you feel that your experience would prevent you from giving the office a very good  response, please contact our office to try to remedy the situation. We may be reached at [phone]. Thank you for taking the time out of your busy day to complete the survey.  Venetia Potters, MD [facility]

## 2024-05-20 ENCOUNTER — Ambulatory Visit: Admitting: Family

## 2024-05-20 ENCOUNTER — Ambulatory Visit (HOSPITAL_BASED_OUTPATIENT_CLINIC_OR_DEPARTMENT_OTHER)
Admission: RE | Admit: 2024-05-20 | Discharge: 2024-05-20 | Disposition: A | Discharge status: Home / Self Care | Source: Ambulatory Visit | Attending: Family | Admitting: Family

## 2024-05-20 VITALS — BP 128/82 | HR 74 | Temp 98.6°F | Resp 16 | Ht 61.0 in | Wt 180.0 lb

## 2024-05-20 DIAGNOSIS — K625 Hemorrhage of anus and rectum: Secondary | ICD-10-CM | POA: Insufficient documentation

## 2024-05-20 DIAGNOSIS — R310 Gross hematuria: Secondary | ICD-10-CM | POA: Insufficient documentation

## 2024-05-20 DIAGNOSIS — K5900 Constipation, unspecified: Secondary | ICD-10-CM | POA: Diagnosis present

## 2024-05-20 DIAGNOSIS — D649 Anemia, unspecified: Secondary | ICD-10-CM | POA: Diagnosis not present

## 2024-05-20 DIAGNOSIS — I10 Essential (primary) hypertension: Secondary | ICD-10-CM | POA: Diagnosis not present

## 2024-05-20 LAB — POC URINALSYSI DIPSTICK (AUTOMATED)
Bilirubin, UA: NEGATIVE
Glucose, UA: NEGATIVE
Ketones, UA: NEGATIVE
Leukocytes, UA: NEGATIVE
Nitrite, UA: NEGATIVE
Protein, UA: NEGATIVE
Spec Grav, UA: 1.015
Urobilinogen, UA: 0.2 U/dL
pH, UA: 6

## 2024-05-20 NOTE — Progress Notes (Signed)
 "  Subjective:     Patient ID: Jennifer Mcgrath, female    DOB: 01/24/1976, 48 y.o.   MRN: [id number]  Chief Complaint  Patient presents with   Follow-up   Hematuria    Patient reports gross hematuria   Blood In Stools    Patient reports blood in stool, straining to have bowel movement    Hematuria    Discussed the use of AI scribe software for clinical note transcription with the patient, who gave verbal consent to proceed.  History of Present Illness Jennifer Mcgrath is a 48 year old female who presents with rectal and urinary bleeding.  She has experienced rectal bleeding on three occasions, characterized by bright red blood on toilet paper and sometimes turning the toilet water cherry colored. There are no clots, and she is certain it is not vaginal bleeding. She has a history of hemorrhoids and has noticed bleeding especially when straining due to constipation.  She reports blood in her urine that started two days ago, with the urine appearing pink. There is no fever, dysuria, or unusual discharge. She experiences one-sided low back pain, particularly when sleeping or getting up, which she believes is on her left side.  She experiences early satiety, feeling full quickly after eating, and sometimes feels the need to vomit. She has regular bowel movements but sometimes feels incomplete evacuation, and the stool is not consistently hard. She has noticed a weight gain of about four pounds, which she attributes to possible constipation.      Health Maintenance Due  Topic Date Due   COVID-19 Vaccine (1) Never done   Pneumococcal Vaccine: 50+ Years (1 of 2 - PCV) Never done   Hepatitis B Vaccines 19-59 Average Risk (1 of 3 - 19+ 3-dose series) Never done   Zoster Vaccines- Shingrix (1 of 2) Never done   Colonoscopy  Never done    Past Medical History:  Diagnosis Date   Benign hematuria    Depression    GERD (gastroesophageal reflux disease)    Hypertension    Iron deficiency anemia     Microscopic hematuria    PE (pulmonary thromboembolism) (HCC)    Umbilical hernia     Past Surgical History:  Procedure Laterality Date   CESAREAN SECTION  1992, 1999, 2003, 2008, 2020   TUBAL LIGATION  2020    Family History  Problem Relation Age of Onset   Diabetes Mellitus II Mother        borderline   Hematuria Father    Diabetes Mellitus II Father    Osteogenesis imperfecta Son     Social History   Socioeconomic History   Marital status: Single    Spouse name: Not on file   Number of children: Not on file   Years of education: Not on file   Highest education level: Not on file  Occupational History   Not on file  Tobacco Use   Smoking status: Never   Smokeless tobacco: Never  Vaping Use   Vaping status: Never Used  Substance and Sexual Activity   Alcohol use: Yes    Comment: occasional    Drug use: No   Sexual activity: Yes    Birth control/protection: Surgical  Other Topics Concern   Not on file  Social History Narrative   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[employer], broadcasting/film/video   5 children (3 boys and 2 girls)   3 Grandchildren   Enjoys reading the bible   No pets   Caffiene  2-3 soda a day   Social Drivers of Health   Tobacco Use: Low Risk (05/13/2024)   Patient History    Smoking Tobacco Use: Never    Smokeless Tobacco Use: Never    Passive Exposure: Not on file  Financial Resource Strain: Not on file  Food Insecurity: Not on file  Transportation Needs: Not on file  Physical Activity: Not on file  Stress: Not on file  Social Connections: Not on file  Intimate Partner Violence: Not on file  Depression (PHQ2-9): Low Risk (05/13/2024)   Depression (PHQ2-9)    PHQ-2 Score: 0  Alcohol Screen: Not on file  Housing: Not on file  Utilities: Not on file  Health Literacy: Not on file    Outpatient Medications Prior to Visit  Medication Sig Dispense Refill   albuterol  (VENTOLIN  HFA) 108 (90 Base) MCG/ACT inhaler Inhale 2 puffs into the lungs every 6 (six)  hours as needed for wheezing or shortness of breath. 8 g 5   Ascorbic Acid (VITAMIN C) 1000 MG tablet Take 1,000 mg by mouth daily.     Ferrous Sulfate 140 (45 Fe) MG TBCR Take 1 tablet by mouth daily.     losartan  (COZAAR ) 50 MG tablet TAKE 1 TABLET(50 MG) BY MOUTH DAILY 90 tablet 1   magnesium  oxide (MAG-OX) 400 (240 Mg) MG tablet Take 1 tablet by mouth 2 (two) times daily.     meclizine  (ANTIVERT ) 25 MG tablet TAKE 1 TABLET(25 MG) BY MOUTH THREE TIMES DAILY AS NEEDED FOR DIZZINESS 30 tablet 0   NIFEdipine  (PROCARDIA  XL/NIFEDICAL XL) 60 MG 24 hr tablet TAKE 1 TABLET(60 MG) BY MOUTH DAILY 90 tablet 1   nystatin  (MYCOSTATIN /NYSTOP ) powder Apply 1 Application topically 2 (two) times daily. 30 g 1   Omega-3 Fatty Acids (FISH OIL) 1000 MG CAPS Take by mouth.     potassium chloride  SA (KLOR-CON  M) 20 MEQ tablet Take 1 tablet (20 mEq total) by mouth 2 (two) times daily. 180 tablet 1   spironolactone  (ALDACTONE ) 25 MG tablet Take 1 tablet (25 mg total) by mouth daily. Needs appt 30 tablet 0   SUMAtriptan  (IMITREX ) 100 MG tablet Take 1 tablet (100 mg total) by mouth once as needed for up to 1 dose. May repeat in 2 hours if headache persists or recurs. 10 tablet 5   zinc gluconate 50 MG tablet Take 50 mg by mouth daily.     No facility-administered medications prior to visit.    Allergies[1]  Review of Systems  Genitourinary:  Positive for hematuria.   See HPI    Objective:    Physical Exam Constitutional:      General: She is not in acute distress.    Appearance: Normal appearance. She is well-developed.  HENT:     Head: Normocephalic and atraumatic.     Right Ear: External ear normal.     Left Ear: External ear normal.  Eyes:     General: No scleral icterus. Neck:     Thyroid: No thyromegaly.  Cardiovascular:     Rate and Rhythm: Normal rate and regular rhythm.     Heart sounds: Normal heart sounds. No murmur heard. Pulmonary:     Effort: Pulmonary effort is normal. No respiratory  distress.     Breath sounds: Normal breath sounds. No wheezing.  Abdominal:     General: There is no distension.     Palpations: Abdomen is soft. There is no mass.     Tenderness: There is no abdominal tenderness.  There is no right CVA tenderness or left CVA tenderness.  Musculoskeletal:     Cervical back: Neck supple.  Skin:    General: Skin is warm and dry.  Neurological:     Mental Status: She is alert and oriented to person, place, and time.  Psychiatric:        Mood and Affect: Mood normal.        Behavior: Behavior normal.        Thought Content: Thought content normal.        Judgment: Judgment normal.      BP 128/82 (BP Location: Right Arm, Patient Position: Sitting, Cuff Size: Large)   Pulse 74   Temp 98.6 F (37 C) (Oral)   Resp 16   Ht 5' 1 (1.549 m)   Wt 180 lb (81.6 kg)   SpO2 99%   BMI 34.01 kg/m  Wt Readings from Last 3 Encounters:  05/20/24 180 lb (81.6 kg)  05/13/24 177 lb (80.3 kg)  04/06/24 178 lb 6.4 oz (80.9 kg)       Assessment & Plan:   Problem List Items Addressed This Visit       Unprioritized   Primary hypertension   Relevant Orders   Basic Metabolic Panel (BMET)   Gross hematuria - Primary   No sign of UTI.  Obtain KUB to assess for nephrolithiasis.  Refer to Urology. Has know hx of microscopic hematuria but has never had gross hematuria. Unfortunately, specimen was disposed of prior to collection for culture- my suspicion for UTI is low.  Can bring her back if she develops UTI sx's for re-collection.       Relevant Orders   POCT Urinalysis Dipstick (Automated)   DG Abd 2 Views   Ambulatory referral to Urology   BRBPR (bright red blood per rectum)   constipation Intermittent rectal bleeding likely due to hemorrhoids and constipation. No clots or vaginal bleeding. Reports + external hemorrhoids present. - Referred to gastroenterology for colonoscopy to evaluate for other potential causes of rectal bleeding. - Advised high fiber  diet and adequate hydration to prevent straining/constipation. - Instructed to seek emergency care if experiencing severe rectal bleeding, abdominal pain, or fever.      Relevant Orders   Ambulatory referral to Gastroenterology   Anemia   Relevant Orders   CBC with Differential/Platelet   Iron, TIBC and Ferritin Panel   Other Visit Diagnoses       Constipation, unspecified constipation type       Relevant Orders   DG Abd 2 Views       Assessment & Plan     I am having Jennifer Mcgrath maintain her Ferrous Sulfate, magnesium  oxide, zinc gluconate, Fish Oil, vitamin C, nystatin , albuterol , potassium chloride  SA, losartan , NIFEdipine , meclizine , SUMAtriptan , and spironolactone .  No orders of the defined types were placed in this encounter.     [1] Not on File  "

## 2024-05-20 NOTE — Assessment & Plan Note (Signed)
 BP stable. Continue losartan , aldactone .

## 2024-05-20 NOTE — Patient Instructions (Signed)
" °  VISIT SUMMARY: Today, you were seen for rectal and urinary bleeding. You reported experiencing bright red blood during bowel movements and pink-colored urine. You also mentioned left-sided low back pain, early satiety, and occasional feelings of incomplete bowel evacuation. We discussed your symptoms and created a plan to address them.  YOUR PLAN: -GROSS HEMATURIA: Gross hematuria means visible blood in the urine. This can be caused by various conditions, including kidney stones or issues within the urinary tract. We have sent your urine for a culture to check for infection, ordered an abdominal x-ray to look for kidney stones and constipation, and referred you to a urologist for further evaluation.  -RECTAL BLEEDING DUE TO HEMORRHOIDS AND CONSTIPATION: Rectal bleeding can occur due to hemorrhoids, which are swollen blood vessels in the rectum or anus, often caused by straining during bowel movements. We have referred you to a gastroenterologist for a colonoscopy to rule out other causes of rectal bleeding. You should follow a high fiber diet and stay well-hydrated to prevent constipation and straining. Seek emergency care if you experience severe rectal bleeding, abdominal pain, or fever.  INSTRUCTIONS: Please follow up with the urologist for further evaluation of your urinary symptoms and with the gastroenterologist for a colonoscopy. Ensure you maintain a high fiber diet and stay hydrated. Seek emergency care if you experience severe rectal bleeding, abdominal pain, or fever.                     "

## 2024-05-20 NOTE — Assessment & Plan Note (Addendum)
 No sign of UTI.  Obtain KUB to assess for nephrolithiasis.  Refer to Urology. Has know hx of microscopic hematuria but has never had gross hematuria. Unfortunately, specimen was disposed of prior to collection for culture- my suspicion for UTI is low.  Can bring her back if she develops UTI sx's for re-collection.

## 2024-05-20 NOTE — Assessment & Plan Note (Signed)
 constipation Intermittent rectal bleeding likely due to hemorrhoids and constipation. No clots or vaginal bleeding. Reports + external hemorrhoids present. - Referred to gastroenterology for colonoscopy to evaluate for other potential causes of rectal bleeding. - Advised high fiber diet and adequate hydration to prevent straining/constipation. - Instructed to seek emergency care if experiencing severe rectal bleeding, abdominal pain, or fever.

## 2024-05-21 LAB — CBC WITH DIFFERENTIAL/PLATELET
Absolute Lymphocytes: 2587 {cells}/uL (ref 850–3900)
Absolute Monocytes: 408 {cells}/uL (ref 200–950)
Basophils Absolute: 29 {cells}/uL (ref 0–200)
Basophils Relative: 0.6 %
Eosinophils Absolute: 101 {cells}/uL (ref 15–500)
Eosinophils Relative: 2.1 %
HCT: 36 % (ref 35.9–46.0)
Hemoglobin: 11.8 g/dL (ref 11.7–15.5)
MCH: 29.5 pg (ref 27.0–33.0)
MCHC: 32.8 g/dL (ref 31.6–35.4)
MCV: 90 fL (ref 81.4–101.7)
MPV: 11.8 fL (ref 7.5–12.5)
Monocytes Relative: 8.5 %
Neutro Abs: 1675 {cells}/uL (ref 1500–7800)
Neutrophils Relative %: 34.9 %
Platelets: 334 Thousand/uL (ref 140–400)
RBC: 4 Million/uL (ref 3.80–5.10)
RDW: 11.7 % (ref 11.0–15.0)
Total Lymphocyte: 53.9 %
WBC: 4.8 Thousand/uL (ref 3.8–10.8)

## 2024-05-21 LAB — IRON,TIBC AND FERRITIN PANEL
%SAT: 51 % — ABNORMAL HIGH (ref 16–45)
Ferritin: 20 ng/mL (ref 16–232)
Iron: 162 ug/dL — ABNORMAL HIGH (ref 45–160)
TIBC: 316 ug/dL (ref 250–450)

## 2024-05-21 LAB — BASIC METABOLIC PANEL WITH GFR
BUN: 13 mg/dL (ref 7–25)
CO2: 28 mmol/L (ref 20–32)
Calcium: 9.3 mg/dL (ref 8.6–10.4)
Chloride: 105 mmol/L (ref 98–110)
Creat: 0.79 mg/dL (ref 0.50–1.03)
Glucose, Bld: 65 mg/dL (ref 65–99)
Potassium: 3.7 mmol/L (ref 3.5–5.3)
Sodium: 140 mmol/L (ref 135–146)
eGFR: 91 mL/min/1.73m2

## 2024-05-23 ENCOUNTER — Ambulatory Visit: Payer: Self-pay | Admitting: Family

## 2024-06-29 ENCOUNTER — Ambulatory Visit: Admitting: Urology

## 2024-07-01 ENCOUNTER — Ambulatory Visit: Admitting: Urology

## 2024-08-01 ENCOUNTER — Ambulatory Visit: Admitting: Urology

## 2024-11-17 ENCOUNTER — Ambulatory Visit: Payer: Self-pay | Admitting: Neurology
